# Patient Record
Sex: Male | Born: 1935 | Race: White | Hispanic: No | Smoking: Former smoker
Health system: Southern US, Community
[De-identification: ages and names within clinical notes are randomized; demographics above are authoritative.]

## PROBLEM LIST (undated history)

## (undated) DIAGNOSIS — I2510 Atherosclerotic heart disease of native coronary artery without angina pectoris: Secondary | ICD-10-CM

## (undated) DIAGNOSIS — I350 Nonrheumatic aortic (valve) stenosis: Secondary | ICD-10-CM

## (undated) DIAGNOSIS — K4090 Unilateral inguinal hernia, without obstruction or gangrene, not specified as recurrent: Secondary | ICD-10-CM

## (undated) DIAGNOSIS — N183 Chronic kidney disease, stage 3 unspecified: Secondary | ICD-10-CM

## (undated) DIAGNOSIS — R918 Other nonspecific abnormal finding of lung field: Secondary | ICD-10-CM

## (undated) DIAGNOSIS — I714 Abdominal aortic aneurysm, without rupture, unspecified: Secondary | ICD-10-CM

## (undated) DIAGNOSIS — Z952 Presence of prosthetic heart valve: Secondary | ICD-10-CM

## (undated) DIAGNOSIS — I10 Essential (primary) hypertension: Secondary | ICD-10-CM

## (undated) DIAGNOSIS — R109 Unspecified abdominal pain: Secondary | ICD-10-CM

## (undated) DIAGNOSIS — G8929 Other chronic pain: Secondary | ICD-10-CM

## (undated) DIAGNOSIS — F22 Delusional disorders: Secondary | ICD-10-CM

## (undated) DIAGNOSIS — Z8639 Personal history of other endocrine, nutritional and metabolic disease: Secondary | ICD-10-CM

## (undated) HISTORY — DX: Personal history of other endocrine, nutritional and metabolic disease: Z86.39

## (undated) HISTORY — PX: CATARACT EXTRACTION, BILATERAL: SHX1313

## (undated) HISTORY — DX: Essential (primary) hypertension: I10

---
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
## 2001-08-12 ENCOUNTER — Ambulatory Visit (HOSPITAL_COMMUNITY): Admission: RE | Admit: 2001-08-12 | Discharge: 2001-08-12 | Payer: Self-pay | Admitting: Ophthalmology

## 2001-09-16 ENCOUNTER — Ambulatory Visit (HOSPITAL_COMMUNITY): Admission: RE | Admit: 2001-09-16 | Discharge: 2001-09-16 | Payer: Self-pay | Admitting: Ophthalmology

## 2002-09-17 HISTORY — PX: EYE SURGERY: SHX253

## 2002-09-21 ENCOUNTER — Other Ambulatory Visit: Admission: RE | Admit: 2002-09-21 | Discharge: 2002-09-21 | Payer: Self-pay | Admitting: Dermatology

## 2003-10-12 ENCOUNTER — Ambulatory Visit (HOSPITAL_COMMUNITY): Admission: RE | Admit: 2003-10-12 | Discharge: 2003-10-12 | Payer: Self-pay | Admitting: Ophthalmology

## 2010-05-25 ENCOUNTER — Emergency Department (HOSPITAL_COMMUNITY): Admission: EM | Admit: 2010-05-25 | Discharge: 2010-05-25 | Payer: Self-pay | Admitting: Emergency Medicine

## 2010-06-16 ENCOUNTER — Ambulatory Visit (HOSPITAL_COMMUNITY): Payer: Self-pay | Admitting: Psychology

## 2010-06-27 ENCOUNTER — Ambulatory Visit (HOSPITAL_COMMUNITY): Payer: Self-pay | Admitting: Psychology

## 2010-09-08 ENCOUNTER — Emergency Department (HOSPITAL_COMMUNITY)
Admission: EM | Admit: 2010-09-08 | Discharge: 2010-09-08 | Payer: Self-pay | Source: Home / Self Care | Admitting: Emergency Medicine

## 2010-10-10 ENCOUNTER — Ambulatory Visit (HOSPITAL_COMMUNITY)
Admission: RE | Admit: 2010-10-10 | Discharge: 2010-10-10 | Payer: Self-pay | Source: Home / Self Care | Attending: Psychiatry | Admitting: Psychiatry
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
## 2010-10-14 ENCOUNTER — Inpatient Hospital Stay (HOSPITAL_COMMUNITY)
Admission: EM | Admit: 2010-10-14 | Discharge: 2010-10-18 | DRG: 603 | Disposition: A | Discharge status: Home Health | Payer: MEDICARE | Attending: Internal Medicine | Admitting: Internal Medicine

## 2010-10-14 DIAGNOSIS — F29 Unspecified psychosis not due to a substance or known physiological condition: Secondary | ICD-10-CM | POA: Diagnosis present

## 2010-10-14 DIAGNOSIS — I10 Essential (primary) hypertension: Secondary | ICD-10-CM | POA: Diagnosis present

## 2010-10-14 DIAGNOSIS — R946 Abnormal results of thyroid function studies: Secondary | ICD-10-CM | POA: Diagnosis present

## 2010-10-14 DIAGNOSIS — D649 Anemia, unspecified: Secondary | ICD-10-CM | POA: Diagnosis present

## 2010-10-14 DIAGNOSIS — E538 Deficiency of other specified B group vitamins: Secondary | ICD-10-CM | POA: Diagnosis present

## 2010-10-14 DIAGNOSIS — L02419 Cutaneous abscess of limb, unspecified: Principal | ICD-10-CM | POA: Diagnosis present

## 2010-10-14 DIAGNOSIS — A4901 Methicillin susceptible Staphylococcus aureus infection, unspecified site: Secondary | ICD-10-CM | POA: Diagnosis present

## 2010-10-14 DIAGNOSIS — R7309 Other abnormal glucose: Secondary | ICD-10-CM | POA: Diagnosis present

## 2010-10-14 LAB — DIFFERENTIAL
Basophils Absolute: 0 10*3/uL (ref 0.0–0.1)
Basophils Relative: 1 % (ref 0–1)
Lymphocytes Relative: 22 % (ref 12–46)
Monocytes Absolute: 0.8 10*3/uL (ref 0.1–1.0)
Neutro Abs: 5.3 10*3/uL (ref 1.7–7.7)
Neutrophils Relative %: 65 % (ref 43–77)

## 2010-10-14 LAB — COMPREHENSIVE METABOLIC PANEL
ALT: 33 U/L (ref 0–53)
AST: 37 U/L (ref 0–37)
Albumin: 3.2 g/dL — ABNORMAL LOW (ref 3.5–5.2)
Alkaline Phosphatase: 66 U/L (ref 39–117)
CO2: 28 mEq/L (ref 19–32)
Calcium: 8.4 mg/dL (ref 8.4–10.5)
Chloride: 98 mEq/L (ref 96–112)
Glucose, Bld: 87 mg/dL (ref 70–99)
Potassium: 4.2 mEq/L (ref 3.5–5.1)
Sodium: 134 mEq/L — ABNORMAL LOW (ref 135–145)
Total Bilirubin: 0.2 mg/dL — ABNORMAL LOW (ref 0.3–1.2)
Total Protein: 6.8 g/dL (ref 6.0–8.3)

## 2010-10-14 LAB — RAPID URINE DRUG SCREEN, HOSP PERFORMED
Barbiturates: NOT DETECTED
Benzodiazepines: NOT DETECTED
Cocaine: NOT DETECTED
Tetrahydrocannabinol: NOT DETECTED

## 2010-10-14 LAB — CBC
HCT: 34.2 % — ABNORMAL LOW (ref 39.0–52.0)
MCV: 87.5 fL (ref 78.0–100.0)
Platelets: 249 10*3/uL (ref 150–400)
RBC: 3.91 MIL/uL — ABNORMAL LOW (ref 4.22–5.81)
WBC: 8.1 10*3/uL (ref 4.0–10.5)

## 2010-10-14 LAB — URINE MICROSCOPIC-ADD ON

## 2010-10-14 LAB — URINALYSIS, ROUTINE W REFLEX MICROSCOPIC
Ketones, ur: NEGATIVE mg/dL
Leukocytes, UA: NEGATIVE
Nitrite: NEGATIVE
Protein, ur: NEGATIVE mg/dL

## 2010-10-15 LAB — DIFFERENTIAL
Basophils Relative: 0 % (ref 0–1)
Eosinophils Absolute: 0.5 10*3/uL (ref 0.0–0.7)
Eosinophils Relative: 7 % — ABNORMAL HIGH (ref 0–5)
Monocytes Absolute: 0.8 10*3/uL (ref 0.1–1.0)
Monocytes Relative: 12 % (ref 3–12)
Neutro Abs: 3.9 10*3/uL (ref 1.7–7.7)
Neutrophils Relative %: 57 % (ref 43–77)

## 2010-10-15 LAB — CBC
HCT: 30.2 % — ABNORMAL LOW (ref 39.0–52.0)
MCH: 28.4 pg (ref 26.0–34.0)
MCHC: 32.5 g/dL (ref 30.0–36.0)
RBC: 3.45 MIL/uL — ABNORMAL LOW (ref 4.22–5.81)
RDW: 13.2 % (ref 11.5–15.5)

## 2010-10-15 LAB — VITAMIN B12: Vitamin B-12: 268 pg/mL (ref 211–911)

## 2010-10-15 LAB — FOLATE: Folate: 18.6 ng/mL

## 2010-10-15 LAB — GLUCOSE, CAPILLARY: Glucose-Capillary: 132 mg/dL — ABNORMAL HIGH (ref 70–99)

## 2010-10-15 LAB — TSH: TSH: 0.02 u[IU]/mL — ABNORMAL LOW (ref 0.350–4.500)

## 2010-10-16 LAB — GLUCOSE, CAPILLARY
Glucose-Capillary: 114 mg/dL — ABNORMAL HIGH (ref 70–99)
Glucose-Capillary: 96 mg/dL (ref 70–99)
Glucose-Capillary: 128 mg/dL — ABNORMAL HIGH (ref 70–99)

## 2010-10-16 LAB — COMPREHENSIVE METABOLIC PANEL
ALT: 20 U/L (ref 0–53)
AST: 28 U/L (ref 0–37)
Alkaline Phosphatase: 60 U/L (ref 39–117)
BUN: 11 mg/dL (ref 6–23)
Chloride: 104 mEq/L (ref 96–112)
GFR calc Af Amer: >60 mL/min (ref >60)
Potassium: 4 mEq/L (ref 3.5–5.1)
Sodium: 136 mEq/L (ref 135–145)
Total Bilirubin: 0.4 mg/dL (ref 0.3–1.2)

## 2010-10-17 LAB — CBC
MCHC: 33 g/dL (ref 30.0–36.0)
Platelets: 277 10*3/uL (ref 150–400)
RDW: 13.1 % (ref 11.5–15.5)
WBC: 7.2 10*3/uL (ref 4.0–10.5)

## 2010-10-17 LAB — DIFFERENTIAL
Basophils Absolute: 0 10*3/uL (ref 0.0–0.1)
Basophils Relative: 1 % (ref 0–1)
Eosinophils Absolute: 0.4 10*3/uL (ref 0.0–0.7)
Eosinophils Relative: 6 % — ABNORMAL HIGH (ref 0–5)
Lymphocytes Relative: 24 % (ref 12–46)
Monocytes Absolute: 0.8 10*3/uL (ref 0.1–1.0)

## 2010-10-17 LAB — GLUCOSE, CAPILLARY
Glucose-Capillary: 91 mg/dL (ref 70–99)
Glucose-Capillary: 101 mg/dL — ABNORMAL HIGH (ref 70–99)

## 2010-10-18 ENCOUNTER — Encounter (HOSPITAL_COMMUNITY): Payer: Self-pay

## 2010-10-18 ENCOUNTER — Inpatient Hospital Stay (HOSPITAL_COMMUNITY)
Admission: EM | Admit: 2010-10-18 | Discharge: 2010-10-18 | Disposition: A | Discharge status: Home / Self Care | Payer: MEDICARE | Source: Home / Self Care

## 2010-10-18 LAB — COMPREHENSIVE METABOLIC PANEL
AST: 43 U/L — ABNORMAL HIGH (ref 0–37)
Albumin: 2.7 g/dL — ABNORMAL LOW (ref 3.5–5.2)
Alkaline Phosphatase: 67 U/L (ref 39–117)
BUN: 13 mg/dL (ref 6–23)
Chloride: 101 mEq/L (ref 96–112)
GFR calc Af Amer: >60 mL/min (ref >60)
Potassium: 4.1 mEq/L (ref 3.5–5.1)
Sodium: 137 mEq/L (ref 135–145)
Total Protein: 6.2 g/dL (ref 6.0–8.3)

## 2010-10-18 LAB — CBC
HCT: 34.8 % — ABNORMAL LOW (ref 39.0–52.0)
Hemoglobin: 11.6 g/dL — ABNORMAL LOW (ref 13.0–17.0)
MCH: 29 pg (ref 26.0–34.0)
MCHC: 33.3 g/dL (ref 30.0–36.0)
RDW: 13.2 % (ref 11.5–15.5)

## 2010-10-18 LAB — DIFFERENTIAL
Basophils Relative: 1 % (ref 0–1)
Eosinophils Relative: 9 % — ABNORMAL HIGH (ref 0–5)
Monocytes Absolute: 1 10*3/uL (ref 0.1–1.0)
Monocytes Relative: 13 % — ABNORMAL HIGH (ref 3–12)
Neutro Abs: 3.7 10*3/uL (ref 1.7–7.7)

## 2010-10-18 LAB — GLUCOSE, CAPILLARY: Glucose-Capillary: 91 mg/dL (ref 70–99)

## 2010-10-18 MED ORDER — SODIUM PERTECHNETATE TC 99M INJECTION
10.0000 | Freq: Once | INTRAVENOUS | Status: AC | PRN
  Administered 2010-10-18 (×2): 10.3 via INTRAVENOUS

## 2010-10-18 MED ORDER — SODIUM IODIDE I 131 CAPSULE
10.0000 | Freq: Once | INTRAVENOUS | Status: AC | PRN
  Administered 2010-10-17: 7 via ORAL

## 2010-10-18 MED ORDER — SODIUM IODIDE I 131 CAPSULE: 10.0000 | Freq: Once | INTRAVENOUS | Status: AC | PRN

## 2010-10-19 LAB — CULTURE, BLOOD (ROUTINE X 2): Culture: NO GROWTH

## 2010-10-21 LAB — METHYLMALONIC ACID, SERUM: Methylmalonic Acid, Quantitative: 146 nmol/L (ref 87–318)
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
## 2010-10-22 NOTE — Discharge Summary (Signed)
  Dan Perez, Dan Perez NO.:  [id number]  MEDICAL RECORD NO.:  [id number]           PATIENT TYPE:  I  LOCATION:  A302                          FACILITY:  [facility]  PHYSICIAN:  Marinda Elk, M.D.DATE OF BIRTH:  06/07/1932  DATE OF ADMISSION:  10/14/2010 DATE OF DISCHARGE:  02/01/2012LH                              DISCHARGE SUMMARY   ADDENDUM  DISCHARGE DIAGNOSIS:  Borderline B12 with MMA pending.  MEDICATION LIST:  Vitamin B12, 1000 mcg daily for 4 days and weekly for a month, then monthly.  HOSPITAL COURSE: 1. Vitamin B12.  B12 was checked which was borderline low.  MMA is     pending at the time of this dictation.  We have been replacing his     B12 with daily injections of B12.  He will need to follow up with     his primary care doctor, Dr. Juanetta Gosling.     Marinda Elk, M.D.     AF/MEDQ  D:  10/18/2010  T:  10/19/2010  Job:  [id number]  Electronically Signed by Lambert Keto M.D. on 10/22/2010 11:47:00 AM
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
## 2010-10-23 ENCOUNTER — Ambulatory Visit (HOSPITAL_COMMUNITY): Payer: MEDICARE

## 2010-10-24 ENCOUNTER — Other Ambulatory Visit (HOSPITAL_COMMUNITY): Payer: MEDICARE

## 2010-11-14 ENCOUNTER — Other Ambulatory Visit (HOSPITAL_COMMUNITY): Payer: Self-pay | Admitting: "Endocrinology

## 2010-11-14 DIAGNOSIS — E049 Nontoxic goiter, unspecified: Secondary | ICD-10-CM

## 2010-11-15 ENCOUNTER — Ambulatory Visit (HOSPITAL_COMMUNITY)
Admission: RE | Admit: 2010-11-15 | Discharge: 2010-11-15 | Disposition: A | Discharge status: Home / Self Care | Payer: Medicare Other | Source: Ambulatory Visit | Attending: Internal Medicine | Admitting: Internal Medicine

## 2010-11-15 DIAGNOSIS — E049 Nontoxic goiter, unspecified: Secondary | ICD-10-CM

## 2010-11-15 DIAGNOSIS — E042 Nontoxic multinodular goiter: Secondary | ICD-10-CM | POA: Insufficient documentation

## 2010-11-16 ENCOUNTER — Other Ambulatory Visit (HOSPITAL_COMMUNITY): Payer: Self-pay | Admitting: "Endocrinology

## 2010-11-16 DIAGNOSIS — E049 Nontoxic goiter, unspecified: Secondary | ICD-10-CM

## 2010-11-21 ENCOUNTER — Ambulatory Visit (HOSPITAL_COMMUNITY): Payer: Medicare Other

## 2010-11-21 ENCOUNTER — Encounter (HOSPITAL_COMMUNITY): Payer: Medicare Other | Admitting: Psychiatry

## 2010-11-27 LAB — DIFFERENTIAL
Basophils Absolute: 0 10*3/uL (ref 0.0–0.1)
Lymphocytes Relative: 31 % (ref 12–46)
Lymphs Abs: 1.9 10*3/uL (ref 0.7–4.0)
Monocytes Absolute: 0.7 10*3/uL (ref 0.1–1.0)
Monocytes Relative: 12 % (ref 3–12)
Neutro Abs: 3.1 10*3/uL (ref 1.7–7.7)

## 2010-11-27 LAB — URINALYSIS, ROUTINE W REFLEX MICROSCOPIC
Ketones, ur: NEGATIVE mg/dL
Leukocytes, UA: NEGATIVE
Nitrite: NEGATIVE
Protein, ur: NEGATIVE mg/dL
Urobilinogen, UA: 0.2 mg/dL (ref 0.0–1.0)
pH: 6 (ref 5.0–8.0)

## 2010-11-27 LAB — RAPID URINE DRUG SCREEN, HOSP PERFORMED
Amphetamines: NOT DETECTED
Tetrahydrocannabinol: NOT DETECTED

## 2010-11-27 LAB — BASIC METABOLIC PANEL
GFR calc non Af Amer: >60 mL/min (ref >60)
Potassium: 4.2 mEq/L (ref 3.5–5.1)
Sodium: 137 mEq/L (ref 135–145)

## 2010-11-27 LAB — CBC
HCT: 38.2 % — ABNORMAL LOW (ref 39.0–52.0)
Hemoglobin: 12.4 g/dL — ABNORMAL LOW (ref 13.0–17.0)
RBC: 4.39 MIL/uL (ref 4.22–5.81)
WBC: 6 10*3/uL (ref 4.0–10.5)

## 2010-11-27 LAB — ETHANOL: Alcohol, Ethyl (B): <5 mg/dL (ref 0–10)

## 2010-11-28 ENCOUNTER — Other Ambulatory Visit (HOSPITAL_COMMUNITY): Payer: Self-pay | Admitting: "Endocrinology

## 2010-11-28 ENCOUNTER — Encounter (HOSPITAL_COMMUNITY): Payer: Self-pay

## 2010-11-28 ENCOUNTER — Ambulatory Visit (HOSPITAL_COMMUNITY)
Admission: RE | Admit: 2010-11-28 | Discharge: 2010-11-28 | Disposition: A | Discharge status: Home / Self Care | Payer: Medicare Other | Source: Ambulatory Visit | Attending: Internal Medicine | Admitting: Internal Medicine

## 2010-11-28 DIAGNOSIS — E049 Nontoxic goiter, unspecified: Secondary | ICD-10-CM

## 2010-11-28 DIAGNOSIS — R918 Other nonspecific abnormal finding of lung field: Secondary | ICD-10-CM | POA: Insufficient documentation

## 2010-11-28 DIAGNOSIS — R937 Abnormal findings on diagnostic imaging of other parts of musculoskeletal system: Secondary | ICD-10-CM | POA: Insufficient documentation

## 2010-11-28 MED ORDER — IOHEXOL 300 MG/ML  SOLN
100.0000 mL | Freq: Once | INTRAMUSCULAR | Status: AC | PRN
  Administered 2010-11-28: 100 mL via INTRAVENOUS

## 2010-11-30 LAB — DIFFERENTIAL
Basophils Absolute: 0 10*3/uL (ref 0.0–0.1)
Basophils Relative: 0 % (ref 0–1)
Lymphocytes Relative: 28 % (ref 12–46)
Monocytes Absolute: 0.6 10*3/uL (ref 0.1–1.0)
Monocytes Relative: 10 % (ref 3–12)
Neutro Abs: 3.7 10*3/uL (ref 1.7–7.7)
Neutrophils Relative %: 57 % (ref 43–77)

## 2010-11-30 LAB — RAPID URINE DRUG SCREEN, HOSP PERFORMED
Barbiturates: NOT DETECTED
Benzodiazepines: NOT DETECTED
Cocaine: NOT DETECTED
Opiates: NOT DETECTED

## 2010-11-30 LAB — HEPATIC FUNCTION PANEL
ALT: 41 U/L (ref 0–53)
AST: 41 U/L — ABNORMAL HIGH (ref 0–37)
Albumin: 3.7 g/dL (ref 3.5–5.2)
Bilirubin, Direct: 0.1 mg/dL (ref 0.0–0.3)

## 2010-11-30 LAB — URINALYSIS, ROUTINE W REFLEX MICROSCOPIC
Bilirubin Urine: NEGATIVE
Glucose, UA: NEGATIVE mg/dL
Specific Gravity, Urine: 1.025 (ref 1.005–1.030)
Urobilinogen, UA: 0.2 mg/dL (ref 0.0–1.0)

## 2010-11-30 LAB — URINE MICROSCOPIC-ADD ON

## 2010-11-30 LAB — CBC
HCT: 36.3 % — ABNORMAL LOW (ref 39.0–52.0)
Hemoglobin: 12.2 g/dL — ABNORMAL LOW (ref 13.0–17.0)
RDW: 13.7 % (ref 11.5–15.5)
WBC: 6.4 10*3/uL (ref 4.0–10.5)

## 2010-11-30 LAB — ETHANOL: Alcohol, Ethyl (B): <5 mg/dL (ref 0–10)

## 2010-11-30 LAB — BASIC METABOLIC PANEL
Calcium: 8.7 mg/dL (ref 8.4–10.5)
GFR calc non Af Amer: >60 mL/min (ref >60)
Potassium: 4.3 mEq/L (ref 3.5–5.1)
Sodium: 136 mEq/L (ref 135–145)

## 2010-12-20 ENCOUNTER — Encounter (INDEPENDENT_AMBULATORY_CARE_PROVIDER_SITE_OTHER): Payer: Medicare Other | Admitting: Thoracic Surgery

## 2010-12-20 DIAGNOSIS — E049 Nontoxic goiter, unspecified: Secondary | ICD-10-CM

## 2010-12-21 NOTE — Letter (Signed)
December 20, 2010  Purcell Nails, MD [address] [place], [place] [address]  Re:  Dan Perez, Dan Perez             DOB:  1932-07-09  Dear Dr. Fransico Him:  I appreciate the opportunity to see the patient.  This 77 year old patient had a right thyroidectomy, partial thyroidectomy in 1962 for apparently a goiter and was found to have a left substernal goiter that was 12.5 cm x 6 cm x 8.2 cm.  His radioactive iodine uptake was decreased at 6% and had low TSH all suggestive of possible thyroiditis. Because of this, there is also the evidence of tracheal compression, he is referred here for evaluation.  PAST MEDICAL HISTORY:  He had a fall 3-4 months ago into a rabbit cage after going to sleep from [place] inhalation.  He probably had fractures of his fourth through sixth rib on the left side, somehow left rib fractures also.  He has hypertension and diabetes mellitus type 2.  MEDICATIONS:  He takes: 1. Lasix 20 mg a day. 2. Lisinopril 5 mg a day. 3. Metformin 5000 mg a day. 4. Hydrochlorothiazide 12.5 mg daily. Marland Kitchen  He has no allergies.  FAMILY HISTORY:  Positive for thyroid disease.  His father had thyroidectomy and goiters and his daughter also has been followed for thyroid cyst.  SOCIAL HISTORY:  Single, has had 7 children, apparently divorced.  He quit smoking in 1984.  Does not drink alcohol on a regular basis.  REVIEW OF SYSTEMS:  180 pounds, 5 feet 4 inches.  His weight is fluctuated. CARDIAC:  He has a heart murmur and some intermittent arrhythmias. PULMONARY:  No hemoptysis.  No bronchitis. GI:  No nausea, vomiting, constipation, diarrhea. GU:  No kidney disease, dysuria, or frequent urination. VASCULAR:  No claudication, DVT, TIAs. NEUROLOGICAL:  No dizziness headaches, blackouts, seizures. MUSCULOSKELETAL:  He has had intermittent skeletal rash and some muscle pain. PSYCHIATRIC:  No depression or nervousness. EYE AND ENT:  No change in eyesight or  hearing. HEMATOLOGICAL:  No problems with bleeding, clotting disorders, or anemia.  On physical exam, he is a thin Caucasian male in no acute distress.  His blood pressure is 153/76, pulse 84, respirations 18, sats were 95%. Head, eyes, ears, nose, and throat are unremarkable.  Neck appears a thyroid scar.  There is no carotid bruits, cannot really palpate left lobe of his thyroid, may be something at the base of his neck.  Chest: He has got scoliosis and dorsal kyphosis.  His lungs are clear to auscultation and percussion.  Heart:  There is a 2/6 systolic ejection murmur.  Abdomen is soft.  Bowel sounds are normal.  There is no palpable mass.  There is a question of small abdominal aneurysm seen on the CT scan, but could not palpate that.  Pulses are 2+.  There is no clubbing or edema.  Neurologically, he is oriented x3.  Sensory and motor intact.  Cranial nerves intact.  I feel that the size of this goiter needs to be resected and that hopefully we can do this from the neck.  I will refer him to Dr. Darnell Level for his evaluation and Dr. Gerrit Friends and I, due to these cases together.  He may need this is one that may need a partial sternotomy of full sternotomy to do the resection.  I explained this to him and his family and we will schedule this after he seeing Dr. Gerrit Friends.  Ines Bloomer, M.D. Electronically Signed  DPB/MEDQ  D:  12/20/2010  T:  12/21/2010  Job:  [id number]  cc:   Ramon Dredge L. Juanetta Gosling, M.D.

## 2011-02-28 ENCOUNTER — Encounter (HOSPITAL_COMMUNITY)
Admission: RE | Admit: 2011-02-28 | Discharge: 2011-02-28 | Disposition: A | Discharge status: Home / Self Care | Payer: Medicare Other | Source: Ambulatory Visit | Attending: Surgery | Admitting: Surgery

## 2011-02-28 LAB — ABO/RH: ABO/RH(D): A POS

## 2011-02-28 LAB — CBC
MCH: 28.3 pg (ref 26.0–34.0)
MCHC: 33 g/dL (ref 30.0–36.0)
MCV: 85.8 fL (ref 78.0–100.0)
Platelets: 194 10*3/uL (ref 150–400)
RBC: 4.24 MIL/uL (ref 4.22–5.81)
RDW: 13.1 % (ref 11.5–15.5)

## 2011-02-28 LAB — TYPE AND SCREEN
ABO/RH(D): A POS
Antibody Screen: NEGATIVE

## 2011-02-28 LAB — URINALYSIS, ROUTINE W REFLEX MICROSCOPIC
Bilirubin Urine: NEGATIVE
Ketones, ur: NEGATIVE mg/dL
Leukocytes, UA: NEGATIVE
Nitrite: NEGATIVE
Protein, ur: NEGATIVE mg/dL
pH: 6 (ref 5.0–8.0)

## 2011-02-28 LAB — DIFFERENTIAL
Basophils Relative: 1 % (ref 0–1)
Eosinophils Absolute: 0.3 10*3/uL (ref 0.0–0.7)
Eosinophils Relative: 4 % (ref 0–5)
Lymphs Abs: 2.2 10*3/uL (ref 0.7–4.0)
Monocytes Absolute: 0.7 10*3/uL (ref 0.1–1.0)
Monocytes Relative: 12 % (ref 3–12)
Neutrophils Relative %: 48 % (ref 43–77)

## 2011-02-28 LAB — BASIC METABOLIC PANEL
BUN: 13 mg/dL (ref 6–23)
Creatinine, Ser: 0.92 mg/dL (ref 0.4–1.5)
GFR calc Af Amer: >60 mL/min (ref >60)
GFR calc non Af Amer: >60 mL/min (ref >60)
Potassium: 5.3 mEq/L — ABNORMAL HIGH (ref 3.5–5.1)

## 2011-02-28 LAB — PROTIME-INR: Prothrombin Time: 12.9 seconds (ref 11.6–15.2)

## 2011-02-28 LAB — URINE MICROSCOPIC-ADD ON

## 2011-03-01 LAB — HEPATIC FUNCTION PANEL
Alkaline Phosphatase: 71 U/L (ref 39–117)
Bilirubin, Direct: <0.1 mg/dL (ref 0.0–0.3)
Total Protein: 6.6 g/dL (ref 6.0–8.3)

## 2011-03-02 ENCOUNTER — Ambulatory Visit (HOSPITAL_COMMUNITY)
Admission: RE | Admit: 2011-03-02 | Discharge: 2011-03-04 | Disposition: A | Discharge status: Home / Self Care | Payer: Medicare Other | Source: Ambulatory Visit | Attending: Surgery | Admitting: Surgery

## 2011-03-02 ENCOUNTER — Other Ambulatory Visit (INDEPENDENT_AMBULATORY_CARE_PROVIDER_SITE_OTHER): Payer: Self-pay | Admitting: Surgery

## 2011-03-02 ENCOUNTER — Ambulatory Visit (HOSPITAL_COMMUNITY)
Admission: RE | Admit: 2011-03-02 | Discharge: 2011-03-02 | Disposition: A | Discharge status: Home / Self Care | Payer: Medicare Other | Source: Ambulatory Visit | Attending: Surgery | Admitting: Surgery

## 2011-03-02 DIAGNOSIS — Z01818 Encounter for other preprocedural examination: Secondary | ICD-10-CM | POA: Insufficient documentation

## 2011-03-02 DIAGNOSIS — E049 Nontoxic goiter, unspecified: Secondary | ICD-10-CM

## 2011-03-02 DIAGNOSIS — Z23 Encounter for immunization: Secondary | ICD-10-CM | POA: Insufficient documentation

## 2011-03-02 DIAGNOSIS — J398 Other specified diseases of upper respiratory tract: Secondary | ICD-10-CM | POA: Insufficient documentation

## 2011-03-02 DIAGNOSIS — Z01810 Encounter for preprocedural cardiovascular examination: Secondary | ICD-10-CM | POA: Insufficient documentation

## 2011-03-02 DIAGNOSIS — E041 Nontoxic single thyroid nodule: Secondary | ICD-10-CM

## 2011-03-02 HISTORY — PX: THYROIDECTOMY: SHX17

## 2011-03-02 LAB — APTT: aPTT: 28 s (ref 24–37)

## 2011-03-02 LAB — BLOOD GAS, ARTERIAL
FIO2: 21 %
O2 Saturation: 98.6 %
Patient temperature: 98.6
TCO2: 25.3 mmol/L (ref 0–100)
pH, Arterial: 7.441 (ref 7.350–7.450)

## 2011-03-04 LAB — CALCIUM: Calcium: 8.9 mg/dL (ref 8.4–10.5)

## 2011-03-05 NOTE — Op Note (Signed)
Perez, PLAIN NO.:  [id number]  MEDICAL RECORD NO.:  [id number]  LOCATION:                               FACILITY:  [facility]  PHYSICIAN:  Velora Heckler, MD      DATE OF BIRTH:  17-Oct-1931  DATE OF PROCEDURE:  03/02/2011                               OPERATIVE REPORT   PREOPERATIVE DIAGNOSIS:  Substernal thyroid goiter with tracheal deviation.  POSTOPERATIVE DIAGNOSIS:  Substernal thyroid goiter with tracheal deviation.  PROCEDURE:  Left thyroid lobectomy and isthmusectomy with cervical approach to resection of substernal thyroid goiter.  SURGEON:  Velora Heckler, MD, FACS  ASSISTANT:  Ines Bloomer, MD  ANESTHESIA:  General per Dr. Sheldon Silvan.  ESTIMATED BLOOD LOSS:  150 mL.  PREPARATION:  ChloraPrep.  COMPLICATIONS:  None.  INDICATIONS:  The patient is a 77 year old white male from [place], [place].  He has a previous history of thyroid surgery at [facility] in [place].  During a recent medical evaluation, the patient had a chest x-ray showing tracheal deviation.  Subsequent CT scan of the chest and thyroid ultrasound showed an enlarged thyroid mainly with an enlarged left lobe extending into the anterior mediastinum measuring approximately 12 cm in greatest dimension.  It was heterogeneous with course calcifications and multiple small nodules.  A small right thyroid remnant was present measuring 3.9 x 1.6 x 1.6 cm. The patient was seen by Dr. Karle Plumber and referred to my practice. He now comes to the operating room for resection of left thyroid lobe and substernal goiter.  BODY OF REPORT:  Procedure was done in OR #7 at the [facility].  The patient was brought to the operating room and placed in supine position on the operating room table.  Following administration of general anesthesia, the patient was positioned and then prepped and draped in the usual strict aseptic fashion.   After ascertaining that an adequate level of anesthesia had been achieved, the patient's previous Kocher incision was reopened with a #15 blade. Dissection was carried through subcutaneous tissues and platysma. Hemostasis was obtained with electrocautery.  External jugular veins were divided between hemostats and ligated with 2-0 silk ties.  Skin flaps were elevated cephalad and caudad and Mahorner retractor was placed for exposure.  Strap muscles were incised in the midline and dissection was carried onto the thyroid.  There was a markedly enlarged thyroid isthmus.  There was scar tissue present from the prior surgery making dissection planes difficult.  With gentle and blunt dissection, judicious use of the harmonic scalpel, and minimal use of the electrocautery, the gland was gently mobilized.  Venous tributaries were divided between Ligaclips with the harmonic scalpel.  Superior pole vessels were identified, divided between medium Ligaclips with the harmonic scalpel.  Gland was gently mobilized in the left neck.  There was marked tracheal deviation to the right.  With gentle and blunt dissection, the large goiter was delivered out of the mediastinum.  This required instrumentation with retractors in order to elevate the large thyroid which was beyond the reach of the finger from the neck.  The gland was eventually delivered up and into the  cervical wound.  Branches of the inferior thyroid artery were divided between Ligaclips with the harmonic scalpel.  Recurrent nerve was identified and preserved. Parathyroid tissue was not identified on the left side of the neck. Gland was dissected out up to the level of the ligament of Berry.  The ligament of Allyson Sabal was transected and the gland was mobilized across the midline.  There was no significant pyramidal lobe, although the isthmus was markedly enlarged.  Dissection was carried to the right of midline where there was a narrowing of the  isthmus down to what appeared to be simply scar tissue at the level of the trachea.  No residual right thyroid lobe was identified on gross visualization.  On palpation, there were no nodules and no masses.  Therefore, the gland was excised off the anterior trachea with electrocautery.  A suture was used to mark the inferior pole of the left lobe where there was the most dense calcifications and cystic changes.  The entire specimen was submitted to Pathology for review.  Neck was irrigated with warm saline.  Good hemostasis was achieved. Surgicel was placed in the operative field.  A 7-mm flat drain was also placed in the left neck and brought out through the left lateral portion of the cervical incision and placed to bulb suction.  Strap muscles were reapproximated in the midline with interrupted 3-0 Vicryl sutures. Platysma was closed with interrupted 3-0 Vicryl sutures.  Skin incision was closed with running 4-0 Monocryl subcuticular suture.  Wound was washed and dried.  Benzoin and Steri-Strips were applied.  Sterile dressings were applied.  The patient was awakened from anesthesia and brought to the recovery room.  The patient tolerated the procedure well.   Velora Heckler, MD, FACS     TMG/MEDQ  D:  03/02/2011  T:  03/03/2011  Job:  [id number]  cc:   Ines Bloomer, M.D. Edward L. Juanetta Gosling, M.D.  Electronically Signed by Darnell Level MD on 03/05/2011 09:26:10 AM

## 2011-03-26 ENCOUNTER — Encounter (INDEPENDENT_AMBULATORY_CARE_PROVIDER_SITE_OTHER): Payer: Self-pay | Admitting: Surgery

## 2011-03-27 ENCOUNTER — Encounter (INDEPENDENT_AMBULATORY_CARE_PROVIDER_SITE_OTHER): Payer: Self-pay | Admitting: Surgery

## 2011-03-27 ENCOUNTER — Ambulatory Visit (INDEPENDENT_AMBULATORY_CARE_PROVIDER_SITE_OTHER): Payer: Medicare Other | Admitting: Surgery

## 2011-03-27 VITALS — Temp 97.6°F

## 2011-03-27 DIAGNOSIS — E049 Nontoxic goiter, unspecified: Secondary | ICD-10-CM

## 2011-03-27 NOTE — Progress Notes (Signed)
HISTORY: Patient is a 77 year old white male who underwent left thyroid lobectomy for a substernal goiter. Final pathologic results are all benign. He is taking thyroid hormone replacement. He is being followed by his endocrinologist.   PERTINENT REVIEW OF SYSTEMS: Patient denies any significant pain. He is had no dysphagia. His voice quality has been excellent.   EXAM: Surgical wound has healed nicely with good cosmetic result. No significant soft tissue swelling. No sign of seroma. First quality is normal.   IMPRESSION: Status post left thyroid lobectomy for substernal thyroid goiter, final pathologic results benign.   PLAN: Patient will follow up with his endocrinologist in [place], Dr. Fransico Him. He will return to see me in 6 weeks for wound check. He will begin applying topical cream to his incision.

## 2011-05-09 ENCOUNTER — Encounter (INDEPENDENT_AMBULATORY_CARE_PROVIDER_SITE_OTHER): Payer: Self-pay | Admitting: Surgery

## 2011-05-10 ENCOUNTER — Encounter (INDEPENDENT_AMBULATORY_CARE_PROVIDER_SITE_OTHER): Payer: Self-pay | Admitting: Surgery

## 2011-05-10 ENCOUNTER — Ambulatory Visit (INDEPENDENT_AMBULATORY_CARE_PROVIDER_SITE_OTHER): Payer: Medicare Other | Admitting: Surgery

## 2011-05-10 VITALS — BP 152/80

## 2011-05-10 DIAGNOSIS — E049 Nontoxic goiter, unspecified: Secondary | ICD-10-CM

## 2011-05-10 NOTE — Progress Notes (Signed)
Visit Diagnoses: 1. Substernal thyroid goiter     HISTORY: Patient returns for followup having undergone resection of substernal thyroid goiter in June 2012.   EXAM: Patient has normal voice quality. There is no stridor. Surgical wound is well healed. No sign of infection. No palpable masses.   IMPRESSION: Status post resection of substernal goiter. Benign final pathology.   PLAN: Patient will continue followup with his endocrinologist for adjustment of his thyroid hormone dosage. Patient will return to see me as needed.   Velora Heckler, MD, FACS General & Endocrine Surgery [facility]

## 2011-08-28 ENCOUNTER — Other Ambulatory Visit: Payer: Self-pay

## 2011-08-28 ENCOUNTER — Emergency Department (HOSPITAL_COMMUNITY)
Admission: EM | Admit: 2011-08-28 | Discharge: 2011-08-29 | Disposition: A | Discharge status: Home / Self Care | Payer: Medicare Other | Attending: Emergency Medicine | Admitting: Emergency Medicine

## 2011-08-28 ENCOUNTER — Encounter (HOSPITAL_COMMUNITY): Payer: Self-pay | Admitting: *Deleted

## 2011-08-28 ENCOUNTER — Emergency Department (HOSPITAL_COMMUNITY): Payer: Medicare Other

## 2011-08-28 DIAGNOSIS — M7989 Other specified soft tissue disorders: Secondary | ICD-10-CM | POA: Insufficient documentation

## 2011-08-28 DIAGNOSIS — F22 Delusional disorders: Secondary | ICD-10-CM | POA: Insufficient documentation

## 2011-08-28 DIAGNOSIS — L02419 Cutaneous abscess of limb, unspecified: Secondary | ICD-10-CM | POA: Insufficient documentation

## 2011-08-28 DIAGNOSIS — I10 Essential (primary) hypertension: Secondary | ICD-10-CM | POA: Insufficient documentation

## 2011-08-28 DIAGNOSIS — R600 Localized edema: Secondary | ICD-10-CM

## 2011-08-28 DIAGNOSIS — L0390 Cellulitis, unspecified: Secondary | ICD-10-CM

## 2011-08-28 DIAGNOSIS — R609 Edema, unspecified: Secondary | ICD-10-CM | POA: Insufficient documentation

## 2011-08-28 LAB — URINALYSIS, ROUTINE W REFLEX MICROSCOPIC
Bilirubin Urine: NEGATIVE
Glucose, UA: NEGATIVE mg/dL
Hgb urine dipstick: NEGATIVE
Ketones, ur: NEGATIVE mg/dL
Leukocytes, UA: NEGATIVE
Nitrite: NEGATIVE
Protein, ur: NEGATIVE mg/dL
Specific Gravity, Urine: <1.005 — ABNORMAL LOW (ref 1.005–1.030)
Urobilinogen, UA: 0.2 mg/dL (ref 0.0–1.0)
pH: 6 (ref 5.0–8.0)

## 2011-08-28 LAB — CBC
HCT: 33.8 % — ABNORMAL LOW (ref 39.0–52.0)
Hemoglobin: 11.2 g/dL — ABNORMAL LOW (ref 13.0–17.0)
MCHC: 33.1 g/dL (ref 30.0–36.0)
RBC: 3.78 MIL/uL — ABNORMAL LOW (ref 4.22–5.81)

## 2011-08-28 LAB — COMPREHENSIVE METABOLIC PANEL
ALT: 29 U/L (ref 0–53)
AST: 35 U/L (ref 0–37)
Albumin: 3.3 g/dL — ABNORMAL LOW (ref 3.5–5.2)
Alkaline Phosphatase: 71 U/L (ref 39–117)
BUN: 16 mg/dL (ref 6–23)
CO2: 28 mEq/L (ref 19–32)
Calcium: 9.3 mg/dL (ref 8.4–10.5)
Chloride: 98 mEq/L (ref 96–112)
Creatinine, Ser: 1.29 mg/dL (ref 0.50–1.35)
GFR calc Af Amer: 60 mL/min — ABNORMAL LOW (ref >90)
GFR calc non Af Amer: 52 mL/min — ABNORMAL LOW (ref >90)
Glucose, Bld: 91 mg/dL (ref 70–99)
Potassium: 4.2 mEq/L (ref 3.5–5.1)
Sodium: 135 mEq/L (ref 135–145)
Total Bilirubin: 0.4 mg/dL (ref 0.3–1.2)
Total Protein: 7.2 g/dL (ref 6.0–8.3)

## 2011-08-28 LAB — DIFFERENTIAL
Lymphocytes Relative: 23 % (ref 12–46)
Lymphs Abs: 1.9 10*3/uL (ref 0.7–4.0)
Monocytes Absolute: 1 10*3/uL (ref 0.1–1.0)
Monocytes Relative: 12 % (ref 3–12)
Neutro Abs: 5.4 10*3/uL (ref 1.7–7.7)
Neutrophils Relative %: 64 % (ref 43–77)

## 2011-08-28 LAB — TROPONIN I: Troponin I: <0.3 ng/mL (ref <0.30)

## 2011-08-28 MED ORDER — VANCOMYCIN HCL IN DEXTROSE 1-5 GM/200ML-% IV SOLN
1000.0000 mg | Freq: Once | INTRAVENOUS | Status: AC
  Administered 2011-08-29: 1000 mg via INTRAVENOUS
  Filled 2011-08-28: qty 200

## 2011-08-28 MED ORDER — FUROSEMIDE 10 MG/ML IJ SOLN
20.0000 mg | Freq: Once | INTRAMUSCULAR | Status: AC
  Administered 2011-08-29: 20 mg via INTRAVENOUS
  Filled 2011-08-28: qty 2

## 2011-08-28 NOTE — ED Notes (Signed)
Into room to assess patient. States he has had bilateral lower leg swelling since February. Unable to verbalize when symptoms worsen. +3 nonpitting edema and bilateral redness of lower legs and knees. Skin very taught. Denies any pain. Unable to palpate petal pulses due to swelling. Denies any needs. Family at bedside. Call bell within reach. No shortness of breath or chest pain.

## 2011-08-28 NOTE — ED Notes (Signed)
Patient report given to this nurse. Assuming care of patient. Patient resting sitting up in bed. No distress. Equal chest rise and fall. Denies needs.

## 2011-08-28 NOTE — ED Notes (Signed)
Pt denies any specific concerns at present time.  Family reporting cellulitis and concerns regarding worsening dementia.
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
## 2011-08-29 ENCOUNTER — Emergency Department (HOSPITAL_COMMUNITY): Payer: Medicare Other

## 2011-08-29 LAB — TSH: TSH: 0.582 u[IU]/mL (ref 0.350–4.500)

## 2011-08-29 LAB — T4, FREE: Free T4: 0.98 ng/dL (ref 0.80–1.80)

## 2011-08-29 LAB — T3, FREE: T3, Free: 2.1 pg/mL — ABNORMAL LOW (ref 2.3–4.2)

## 2011-08-29 MED ORDER — DIPHENHYDRAMINE HCL 50 MG/ML IJ SOLN
12.5000 mg | Freq: Once | INTRAMUSCULAR | Status: AC
  Administered 2011-08-29: 12.5 mg via INTRAVENOUS
  Filled 2011-08-29: qty 1

## 2011-08-29 MED ORDER — DOXYCYCLINE HYCLATE 100 MG PO CAPS: 100.0000 mg | ORAL_CAPSULE | Freq: Two times a day (BID) | ORAL | Status: AC

## 2011-08-29 NOTE — ED Notes (Signed)
Dr. Bebe Shaggy requesting to speak to pt's family.  Pt called his daughter and she is on her way here.  Pt alert and oriented.  Pt sitting up in bed eating breakfast.

## 2011-08-29 NOTE — ED Provider Notes (Signed)
5:17 PM I met with the patient and with the patient's daughter who's here at present and discussed with him the results of Dr. Lanell Matar psychiatric assessment in which he deemed the patient not to represent a threat to himself or others and to have a chronic delusional state not requiring any further acute psychiatric interventions.  The patient and his daughter were in understanding of and agreement with the plan of care and relieved to hear the results of the consult.  The patient will be discharged home.  Felisa Bonier, MD 08/29/11 [phone]

## 2011-08-29 NOTE — ED Provider Notes (Addendum)
BP 117/52  Pulse 88  Temp(Src) 98.9 F (37.2 C) (Oral)  Resp 20  Ht 6' (1.829 m)  Wt 170 lb (77.111 kg)  BMI 23.06 kg/m2  SpO2 95% D/w dr Juanetta Gosling Pt has paranoia at baseline Will see in followup if necessary  Joya Gaskins, MD 08/29/11 1102  1:44 PM Daughter arrived Pt with increased confusion/paranoia She is requesting further psych eval Also has small bruise to head will get ct head telepsych pending  Joya Gaskins, MD 08/29/11 1344
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
## 2011-08-29 NOTE — ED Notes (Signed)
Patient to radiology via stretcher
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
## 2011-08-29 NOTE — ED Notes (Signed)
Into room to see patient. Denies any needs. No distress. No allergic reaction from antibiotic. Denies pain. Call bell at bedside. Family at bedside.

## 2011-08-29 NOTE — ED Notes (Signed)
Patient remains resting with eyes closed and lights off. No distress. Equal chest rise and fall at 20x\minute, regular, equal. Call bell within reach.

## 2011-08-29 NOTE — ED Notes (Signed)
Resting with eyes closed and lights off. Equal chest rise and fall. No distress. No facial grimaces. Call bell within reach.

## 2011-08-29 NOTE — ED Notes (Signed)
Patient back to room from radiology. Patient's left elbow dressed with one telfa and kling wrap to protect old oozing skin tear (dime sized in diameter). Denies any needs. Call bell within reach.

## 2011-08-29 NOTE — ED Notes (Signed)
MD at bedside. 

## 2011-08-29 NOTE — ED Notes (Signed)
Resting with eyes closed and lights off. Equal chest rise and fall. No distress. Equal chest rise and fall. Call bell and family at bedside.

## 2011-08-29 NOTE — ED Notes (Addendum)
Clothes inventoried with Hilbert Corrigan, Charity fundraiser. Items include: One pair of pants, One shirt, Two jackets, One pair brown shoes, One pair socks. One checkbook in pants pocket. One red shirt that remains on patient. One hat. Items locked in EMS cabinet.

## 2011-08-29 NOTE — ED Notes (Signed)
Patient has developed welps on upper right thigh near groin. States it itches a little. No drainage. Denies shortness of breath. md made aware.

## 2011-08-29 NOTE — ED Notes (Signed)
Remains resting with eyes closed and lights off. equal chest rise and fall. No distress. No facial grimaces. Call bell within reach. Bed in low position and locked with side rails up. Will continue to monitor.

## 2011-08-29 NOTE — ED Notes (Signed)
Patient resting with eyes closed and lights off. No distress. Equal chest rise and fall. No facial grimacing. Call bell within reach.

## 2011-08-29 NOTE — ED Notes (Signed)
Remains resting with eyes closed and lights off. No distress. Equal chest rise and fall. No facial grimaces. Call bell within reach. Bed in low position and locked with side rails up.

## 2011-08-29 NOTE — ED Notes (Signed)
Pt eating lunch.  Dan Perez with act team here to see pt.

## 2011-08-29 NOTE — ED Notes (Signed)
Remains resting with eyes closed and lights off. No distress. Equal chest rise and fall. Call bell within reach. No facial grimaces.

## 2011-08-29 NOTE — ED Notes (Signed)
Remains resting in bed with eyes closed and lights off. Equal chest rise and fall. No distress. Call bell within reach. Will continue to monitor.

## 2011-08-29 NOTE — ED Notes (Signed)
Attempts to contact patients daughter unsuccessful.

## 2011-08-29 NOTE — ED Notes (Signed)
Rash on leg diminishing. States it does not itch. Denies any needs. Denies pain. Denies shortness of breath. Call bell within reach. Bed in low position and locked with side rails up.

## 2011-08-29 NOTE — ED Notes (Signed)
Given two warm blankets per request. Allergies and identity verified with patient. Medicated as ordered per md. Denies any needs. Denies pain. Family at bedside. Call bell within reach. No distress.

## 2011-08-29 NOTE — ED Notes (Signed)
Daughter leaving to go home. Name [place]. Number [phone].

## 2013-02-18 ENCOUNTER — Ambulatory Visit (HOSPITAL_COMMUNITY)
Admission: RE | Admit: 2013-02-18 | Discharge: 2013-02-18 | Disposition: A | Discharge status: Home / Self Care | Payer: Medicare Other | Source: Ambulatory Visit | Attending: Pulmonary Disease | Admitting: Pulmonary Disease

## 2013-02-18 ENCOUNTER — Other Ambulatory Visit (HOSPITAL_COMMUNITY): Payer: Self-pay | Admitting: Pulmonary Disease

## 2013-02-18 DIAGNOSIS — R1031 Right lower quadrant pain: Secondary | ICD-10-CM | POA: Insufficient documentation

## 2013-02-18 DIAGNOSIS — K469 Unspecified abdominal hernia without obstruction or gangrene: Secondary | ICD-10-CM

## 2013-02-18 DIAGNOSIS — R933 Abnormal findings on diagnostic imaging of other parts of digestive tract: Secondary | ICD-10-CM | POA: Insufficient documentation

## 2013-07-14 ENCOUNTER — Emergency Department (HOSPITAL_COMMUNITY)
Admission: EM | Admit: 2013-07-14 | Discharge: 2013-07-16 | Disposition: A | Discharge status: Other Acute Inpatient Hospital | Payer: Medicare Other | Attending: Emergency Medicine | Admitting: Emergency Medicine

## 2013-07-14 ENCOUNTER — Encounter (HOSPITAL_COMMUNITY): Payer: Self-pay | Admitting: Emergency Medicine

## 2013-07-14 DIAGNOSIS — F22 Delusional disorders: Secondary | ICD-10-CM | POA: Insufficient documentation

## 2013-07-14 DIAGNOSIS — Z79899 Other long term (current) drug therapy: Secondary | ICD-10-CM | POA: Insufficient documentation

## 2013-07-14 DIAGNOSIS — E119 Type 2 diabetes mellitus without complications: Secondary | ICD-10-CM | POA: Insufficient documentation

## 2013-07-14 DIAGNOSIS — E049 Nontoxic goiter, unspecified: Secondary | ICD-10-CM | POA: Insufficient documentation

## 2013-07-14 DIAGNOSIS — I10 Essential (primary) hypertension: Secondary | ICD-10-CM | POA: Insufficient documentation

## 2013-07-14 HISTORY — DX: Delusional disorders: F22

## 2013-07-14 LAB — RAPID URINE DRUG SCREEN, HOSP PERFORMED
Amphetamines: NOT DETECTED
Barbiturates: NOT DETECTED
Tetrahydrocannabinol: NOT DETECTED

## 2013-07-14 LAB — BASIC METABOLIC PANEL
BUN: 21 mg/dL (ref 6–23)
Chloride: 98 mEq/L (ref 96–112)
GFR calc Af Amer: 56 mL/min — ABNORMAL LOW (ref >90)
GFR calc non Af Amer: 49 mL/min — ABNORMAL LOW (ref >90)
Potassium: 4.2 mEq/L (ref 3.5–5.1)
Sodium: 136 mEq/L (ref 135–145)

## 2013-07-14 LAB — CBC WITH DIFFERENTIAL/PLATELET
Basophils Absolute: 0.1 10*3/uL (ref 0.0–0.1)
Basophils Relative: 1 % (ref 0–1)
Hemoglobin: 11.9 g/dL — ABNORMAL LOW (ref 13.0–17.0)
MCHC: 32.8 g/dL (ref 30.0–36.0)
Monocytes Relative: 10 % (ref 3–12)
Neutro Abs: 2.8 10*3/uL (ref 1.7–7.7)
Neutrophils Relative %: 45 % (ref 43–77)
Platelets: 181 10*3/uL (ref 150–400)

## 2013-07-14 LAB — URINALYSIS W MICROSCOPIC + REFLEX CULTURE
Leukocytes, UA: NEGATIVE
Nitrite: NEGATIVE
Protein, ur: NEGATIVE mg/dL
Specific Gravity, Urine: 1.025 (ref 1.005–1.030)
Urobilinogen, UA: 0.2 mg/dL (ref 0.0–1.0)

## 2013-07-14 LAB — ETHANOL: Alcohol, Ethyl (B): <11 mg/dL (ref 0–11)

## 2013-07-14 NOTE — Progress Notes (Signed)
Dan Perez MHT, Writer received call from [employer] that  Pt has been denied due to medical acuity.

## 2013-07-14 NOTE — ED Notes (Signed)
Ronda Fairly RN from [facility] called and reports pt was referred to a geriatric psych unit and they will notify [facility] staff of any updates.

## 2013-07-14 NOTE — ED Provider Notes (Signed)
CSN: [id number]     Arrival date & time 07/14/13  0220 History   First MD Initiated Contact with Patient 07/14/13 0235     Chief Complaint  Patient presents with  . V70.1    HPI Pt was seen at 0255. Per Police, IVC paperwork and pt, c/o gradual onset and persistence of constant paranoid delusions for an unknown period of time. Pt states he went to the Police department to "talk about some issues I'm having" and was subsequently IVC'd my Therapist, occupational. Pt told Police that he "has a laser in his home and can hear their conversations and it sounds like two thousand hammers," and he "only gets up to reload." Pt stated he was "going to try to escape the laser when he gets home and named person Perrin Smack)." Stated to them he was "very concerned with terrorism and Muslims showing up" and "they want to use my house as a safe house." Pt denies SI, no SA, no HI.    Past Medical History  Diagnosis Date  . Thyroid disease     enlarged goiter  . Edema of both legs   . Hypertension   . Paranoia   . Delusional disorder   . Diabetes mellitus without complication    Past Surgical History  Procedure Laterality Date  . Thyroidectomy  03/02/2011    left lobectomy and isthmusectomy   Family History  Problem Relation Age of Onset  . Heart attack Father   . Dementia Mother   . Stroke Mother    History  Substance Use Topics  . Smoking status: Never Smoker   . Smokeless tobacco: Not on file  . Alcohol Use: No    Review of Systems ROS: Statement: All systems negative except as marked or noted in the HPI; Constitutional: Negative for fever and chills. ; ; Eyes: Negative for eye pain, redness and discharge. ; ; ENMT: Negative for ear pain, hoarseness, nasal congestion, sinus pressure and sore throat. ; ; Cardiovascular: Negative for chest pain, palpitations, diaphoresis, dyspnea and peripheral edema. ; ; Respiratory: Negative for cough, wheezing and stridor. ; ; Gastrointestinal: Negative for nausea,  vomiting, diarrhea, abdominal pain, blood in stool, hematemesis, jaundice and rectal bleeding. . ; ; Genitourinary: Negative for dysuria, flank pain and hematuria. ; ; Musculoskeletal: Negative for back pain and neck pain. Negative for swelling and trauma.; ; Skin: Negative for pruritus, rash, abrasions, blisters, bruising and skin lesion.; ; Neuro: Negative for headache, lightheadedness and neck stiffness. Negative for weakness, altered level of consciousness , altered mental status, extremity weakness, paresthesias, involuntary movement, seizure and syncope.; Psych:  No SI, no SA, no HI, +paranoid delusions.     Allergies  Review of patient's allergies indicates no known allergies.  Home Medications   Current Outpatient Rx  Name  Route  Sig  Dispense  Refill  . furosemide (LASIX) 20 MG tablet   Oral   Take 20 mg by mouth daily.          Marland Kitchen levothyroxine (LEVOTHROID) 25 MCG tablet   Oral   Take 25 mcg by mouth daily.           Marland Kitchen lisinopril (PRINIVIL,ZESTRIL) 5 MG tablet   Oral   Take 5 mg by mouth daily.           Marland Kitchen loratadine (CLARITIN) 10 MG tablet   Oral   Take 10 mg by mouth daily.           . metFORMIN (GLUMETZA) 500 MG (  MOD) 24 hr tablet   Oral   Take 500 mg by mouth daily with breakfast. Check dose           BP 175/76  Pulse 94  Temp(Src) 97.7 F (36.5 C) (Oral)  Resp 16  Ht 6' (1.829 m)  Wt 167 lb 5 oz (75.892 kg)  BMI 22.69 kg/m2  SpO2 100% Physical Exam 0300: Physical examination:  Nursing notes reviewed; Vital signs and O2 SAT reviewed;  Constitutional: Well developed, Well nourished, Well hydrated, In no acute distress; Head:  Normocephalic, atraumatic; Eyes: EOMI, PERRL, No scleral icterus; ENMT: Mouth and pharynx normal, Mucous membranes moist; Neck: Supple, Full range of motion, No lymphadenopathy; Cardiovascular: Regular rate and rhythm, No gallop; Respiratory: Breath sounds clear & equal bilaterally, No wheezes.  Speaking full sentences with ease,  Normal respiratory effort/excursion; Chest: Nontender, Movement normal; Abdomen: Soft, Nontender, Nondistended, Normal bowel sounds;; Extremities: Pulses normal, No deformity. No calf edema or asymmetry.; Neuro: AA&Ox3, Major CN grossly intact.  Speech clear. No gross focal motor or sensory deficits in extremities.; Skin: Color normal, Warm, Dry.; Psych:  Paranoid, delusional. Denies SI.     ED Course  Procedures    EKG Interpretation   None       MDM  MDM Reviewed: previous chart, nursing note and vitals Reviewed previous: labs Interpretation: labs   Results for orders placed during the hospital encounter of 07/14/13  CBC WITH DIFFERENTIAL      Result Value Range   WBC 6.1  4.0 - 10.5 K/uL   RBC 4.01 (*) 4.22 - 5.81 MIL/uL   Hemoglobin 11.9 (*) 13.0 - 17.0 g/dL   HCT 40.9 (*) 81.1 - 91.4 %   MCV 90.5  78.0 - 100.0 fL   MCH 29.7  26.0 - 34.0 pg   MCHC 32.8  30.0 - 36.0 g/dL   RDW 78.2  95.6 - 21.3 %   Platelets 181  150 - 400 K/uL   Neutrophils Relative % 45  43 - 77 %   Neutro Abs 2.8  1.7 - 7.7 K/uL   Lymphocytes Relative 39  12 - 46 %   Lymphs Abs 2.4  0.7 - 4.0 K/uL   Monocytes Relative 10  3 - 12 %   Monocytes Absolute 0.6  0.1 - 1.0 K/uL   Eosinophils Relative 5  0 - 5 %   Eosinophils Absolute 0.3  0.0 - 0.7 K/uL   Basophils Relative 1  0 - 1 %   Basophils Absolute 0.1  0.0 - 0.1 K/uL  BASIC METABOLIC PANEL      Result Value Range   Sodium 136  135 - 145 mEq/L   Potassium 4.2  3.5 - 5.1 mEq/L   Chloride 98  96 - 112 mEq/L   CO2 29  19 - 32 mEq/L   Glucose, Bld 95  70 - 99 mg/dL   BUN 21  6 - 23 mg/dL   Creatinine, Ser 0.86  0.50 - 1.35 mg/dL   Calcium 9.2  8.4 - 57.8 mg/dL   GFR calc non Af Amer 49 (*) >90 mL/min   GFR calc Af Amer 56 (*) >90 mL/min  ETHANOL      Result Value Range   Alcohol, Ethyl (B) <11  0 - 11 mg/dL  URINALYSIS W MICROSCOPIC + REFLEX CULTURE      Result Value Range   Color, Urine YELLOW  YELLOW   APPearance CLEAR  CLEAR    Specific Gravity, Urine 1.025  1.005 - 1.030   pH 6.0  5.0 - 8.0   Glucose, UA NEGATIVE  NEGATIVE mg/dL   Hgb urine dipstick SMALL (*) NEGATIVE   Bilirubin Urine NEGATIVE  NEGATIVE   Ketones, ur TRACE (*) NEGATIVE mg/dL   Protein, ur NEGATIVE  NEGATIVE mg/dL   Urobilinogen, UA 0.2  0.0 - 1.0 mg/dL   Nitrite NEGATIVE  NEGATIVE   Leukocytes, UA NEGATIVE  NEGATIVE   WBC, UA 0-2  <3 WBC/hpf   RBC / HPF 3-6  <3 RBC/hpf   Bacteria, UA FEW (*) RARE   Squamous Epithelial / LPF RARE  RARE  URINE RAPID DRUG SCREEN (HOSP PERFORMED)      Result Value Range   Opiates NONE DETECTED  NONE DETECTED   Cocaine NONE DETECTED  NONE DETECTED   Benzodiazepines NONE DETECTED  NONE DETECTED   Amphetamines NONE DETECTED  NONE DETECTED   Tetrahydrocannabinol NONE DETECTED  NONE DETECTED   Barbiturates NONE DETECTED  NONE DETECTED   Results for DEVAUN, HERNANDEZ (MRN [id number]) as of 07/14/2013 04:39  Ref. Range 10/17/2010 04:52 10/18/2010 04:52 02/28/2011 14:46 08/28/2011 21:53 07/14/2013 03:11  Hemoglobin Latest Range: 13.0-17.0 g/dL 95.6 (L) 21.3 (L) 08.6 (L) 11.2 (L) 11.9 (L)  HCT Latest Range: 39.0-52.0 % 33.3 (L) 34.8 (L) 36.4 (L) 33.8 (L) 36.3 (L)    0400:  No family in ED. H/H per baseline. VSS. Pending TTS eval.   0710:  Pending TTS eval. Sign out to Dr. Deretha Emory.      Laray Anger, DO 07/14/13 [phone]

## 2013-07-14 NOTE — Progress Notes (Signed)
T.Cornel Werber, MHT completed placement procedures by calling the following locations [facility] and [facility] [place] who have availability. The referral packet was faxed to the facilities for review. [facility] and [facility] may have availablilty tomorrow. [facility], [facility] and [facility] ,who were also called, do not have any bed availability.

## 2013-07-14 NOTE — ED Notes (Signed)
Pt's son at bedside, asking for copy of medical records, states he has POA over said pt. No paper work brought to verify this.  Family member was instructed to go to medical records per policy. Pt also asking to take pt's medical cards, food stamp card and credit cards. Family member was directed to charge RN. Pt remains quiet and cooperative at this time.

## 2013-07-14 NOTE — ED Notes (Signed)
Pt's son, Eathan Groman took a key from pt's belongings.

## 2013-07-14 NOTE — BH Assessment (Addendum)
TTS contacted both [facility] ED to speak with nurse and physician and [facility] ED to speak with Bruce in Disposition. Patient's nurse Payton Doughty, RN unavailable thus spoke with Vernell Barrier, RN at 3:47 PM to inform her of decision which had been made to refer patient to a geriatric psych unit. Before calling Bruce in Disposition at [facility] ED [facility] ED called back to inform TTS that patient is a veteran and patient's son who lives near [place] has requested referral to [facility] area if possible. Call placed to MHT Bruce in Disposition to request referral for patient at 3:58 PM. Dr Gwendolyn Grant informed of referral process and son's request at 4:07 PM Carney Bern, LCSW

## 2013-07-14 NOTE — ED Notes (Signed)
Was called out to registration department to talk to two daughters of pt's regarding guardianship.   Also informed Sheriff would be here to serve pt with said POA and guardianship papers. Daughter states she" would be taking my father home today and he would not be going to a facility". EDP notified and had conference with Son and sheriff.

## 2013-07-14 NOTE — ED Notes (Signed)
Pt's son in lobby asking to come back to say goodbye to pt. Visitor explained the visitation policy during subsequent visits . Pt asked to speak to my supervisor.  Charge Nurse notified.

## 2013-07-14 NOTE — BH Assessment (Signed)
Assessment Note  Patient is a 79 year old white male that was IVC'd by the [facility] Department due to paranoid delusions and psychosis for an unknown period of time.  Patient reports that he "has a laser in his home and can hear their conversations and it sounds like two thousand hammers".  Patient reports that he   was "going to try to escape the laser when he gets home".   Patient reports that he is "very concerned with terrorism and Muslims showing up" and "they want to use my house as a safe house."   Pt denies SI/HI.  Patient denies prior psychiatric hospitalizations.  Patient denies prior outpatient mental health therapy or outpatient medication management. Patient denies substance abuse.    Axis I: Bipolar, mixed,Mood Disorder rule out Dementia Axis II: Deferred Axis III:  Past Medical History  Diagnosis Date  . Thyroid disease     enlarged goiter  . Edema of both legs   . Hypertension   . Paranoia   . Delusional disorder   . Diabetes mellitus without complication    Axis IV: other psychosocial or environmental problems, problems related to social environment, problems with access to health care services and problems with primary support group Axis V: 21-30 behavior considerably influenced by delusions or hallucinations OR serious impairment in judgment, communication OR inability to function in almost all areas  Past Medical History:  Past Medical History  Diagnosis Date  . Thyroid disease     enlarged goiter  . Edema of both legs   . Hypertension   . Paranoia   . Delusional disorder   . Diabetes mellitus without complication     Past Surgical History  Procedure Laterality Date  . Thyroidectomy  03/02/2011    left lobectomy and isthmusectomy    Family History:  Family History  Problem Relation Age of Onset  . Heart attack Father   . Dementia Mother   . Stroke Mother     Social History:  reports that he has never smoked. He does not have any  smokeless tobacco history on file. He reports that he does not drink alcohol or use illicit drugs.  Additional Social History:     CIWA: CIWA-Ar BP: 152/70 mmHg Pulse Rate: 91 COWS:    Allergies: No Known Allergies  Home Medications:  (Not in a hospital admission)  OB/GYN Status:  No LMP for male patient.  General Assessment Data Location of Assessment: [facility] Services Is this a Tele or Face-to-Face Assessment?: Tele Assessment Is this an Initial Assessment or a Re-assessment for this encounter?: Initial Assessment Living Arrangements: Alone Can pt return to current living arrangement?: Yes Admission Status: Involuntary Is patient capable of signing voluntary admission?: No Transfer from: Acute Hospital Referral Source: Self/Family/Friend  Medical Screening Exam [facility] Walk-in ONLY) Medical Exam completed: Yes  [facility] Crisis Care Plan Living Arrangements: Alone     Risk to self Suicidal Ideation: No Suicidal Intent: No Is patient at risk for suicide?: No Suicidal Plan?: No Access to Means: No What has been your use of drugs/alcohol within the last 12 months?: n/a Previous Attempts/Gestures: No How many times?: 0 Other Self Harm Risks: None Triggers for Past Attempts: Unpredictable Intentional Self Injurious Behavior: None Family Suicide History: No Recent stressful life event(s): Other (Comment) Persecutory voices/beliefs?: Yes Depression: No Substance abuse history and/or treatment for substance abuse?: No Suicide prevention information given to non-admitted patients: Not applicable  Risk to Others Homicidal Ideation: No Thoughts of Harm to  Others: No Current Homicidal Intent: No Current Homicidal Plan: No Access to Homicidal Means: No Identified Victim: None History of harm to others?: No Assessment of Violence: None Noted Violent Behavior Description: calm Does patient have access to weapons?: Yes (Comment) Criminal Charges Pending?: No Does  patient have a court date: No  Psychosis Hallucinations: Auditory;Visual Delusions: None noted  Mental Status Report Appear/Hygiene: Other (Comment) Eye Contact: Fair Motor Activity: Unremarkable Speech: Tangential Level of Consciousness: Quiet/awake Mood: Suspicious Affect: Blunted Anxiety Level: None Thought Processes: Irrelevant;Flight of Ideas Judgement: Unimpaired Orientation: Person;Place;Time;Situation Obsessive Compulsive Thoughts/Behaviors: None  Cognitive Functioning Concentration: Decreased Memory: Recent Impaired;Remote Intact IQ: Average Insight: Poor Impulse Control: Poor Appetite: Fair Weight Loss: 0 Weight Gain: 0 Sleep: Decreased Total Hours of Sleep: 5 Vegetative Symptoms: None  ADLScreening [facility] Assessment Services) Patient's cognitive ability adequate to safely complete daily activities?: Yes Patient able to express need for assistance with ADLs?: Yes Independently performs ADLs?: Yes (appropriate for developmental age)  Prior Inpatient Therapy Prior Inpatient Therapy: No Prior Therapy Dates: na Prior Therapy Facilty/Provider(s): na Reason for Treatment: na  Prior Outpatient Therapy Prior Outpatient Therapy: No Prior Therapy Dates: na Prior Therapy Facilty/Provider(s): na Reason for Treatment: na  ADL Screening (condition at time of admission) Patient's cognitive ability adequate to safely complete daily activities?: Yes Patient able to express need for assistance with ADLs?: Yes Independently performs ADLs?: Yes (appropriate for developmental age)         Values / Beliefs Cultural Requests During Hospitalization: None Spiritual Requests During Hospitalization: None        Additional Information 1:1 In Past 12 Months?: No CIRT Risk: No Elopement Risk: No Does patient have medical clearance?: Yes     Disposition: Pending Psych consult to rule out Dementia.  Disposition Initial Assessment Completed for this Encounter:  Yes Disposition of Patient: Referred to Patient referred to: Other (Comment)  On Site Evaluation by:   Reviewed with Physician:    Phillip Heal LaVerne 07/14/2013 7:42 AM

## 2013-07-14 NOTE — ED Notes (Signed)
Patient's son contact at [phone] per patient's request. This is the number to use if son needs to be contacted again.

## 2013-07-14 NOTE — ED Notes (Signed)
[facility] notified RN of 1220 tele-psych consult time frame to speak with NP.

## 2013-07-15 MED ORDER — RISPERIDONE 0.5 MG PO TABS
ORAL_TABLET | ORAL | Status: AC
  Filled 2013-07-15: qty 1

## 2013-07-15 MED ORDER — LEVOTHYROXINE SODIUM 25 MCG PO TABS
25.0000 ug | ORAL_TABLET | Freq: Every day | ORAL | Status: DC
  Administered 2013-07-15 – 2013-07-16 (×2): 25 ug via ORAL
  Filled 2013-07-15 (×5): qty 1

## 2013-07-15 MED ORDER — LISINOPRIL 5 MG PO TABS
5.0000 mg | ORAL_TABLET | Freq: Every day | ORAL | Status: DC
  Administered 2013-07-15 – 2013-07-16 (×2): 5 mg via ORAL
  Filled 2013-07-15 (×2): qty 1

## 2013-07-15 MED ORDER — LORATADINE 10 MG PO TABS
10.0000 mg | ORAL_TABLET | Freq: Every day | ORAL | Status: DC
  Administered 2013-07-15: 10 mg via ORAL
  Filled 2013-07-15: qty 1

## 2013-07-15 MED ORDER — RISPERIDONE 0.5 MG PO TABS
0.5000 mg | ORAL_TABLET | Freq: Every day | ORAL | Status: DC
  Administered 2013-07-15: 0.5 mg via ORAL
  Filled 2013-07-15 (×3): qty 1

## 2013-07-15 NOTE — ED Notes (Signed)
Pt resting in bed, voices no complaints, offered am bath, pt declined.

## 2013-07-15 NOTE — Progress Notes (Signed)
Contacted [address] [facility] [phone]). Per Kirt Boys, pt referral has been received, but has not been reviewed. Debbe Odea Yazmine Sorey,MHT

## 2013-07-15 NOTE — ED Notes (Signed)
Ac notified of need for meds. Will bring.

## 2013-07-15 NOTE — ED Notes (Signed)
Pt resting in bed, visiting w/ family.  Sitter at bedside.

## 2013-07-15 NOTE — ED Notes (Signed)
Pt's son Len, states he and his sister are trying to get guardianship of pt.after he is discharged. States Corlis Leak will come and talk with the pt and see if he is incompetent to make his own decision before granting them full guardianship. Sons # [phone]

## 2013-07-15 NOTE — Progress Notes (Signed)
[facility] Spoke to [facility] 0900 stated that pt is below age requirement for there facility.   Eleri Ruben Cathey MHT

## 2013-07-15 NOTE — ED Notes (Signed)
Pt ate entire meal tray, sitter at bedside.  

## 2013-07-15 NOTE — ED Notes (Signed)
Pt's son at bedside for a visit.  Tolerated meal tray well.

## 2013-07-15 NOTE — Progress Notes (Signed)
Writer followed up with [facility].Pt.  does not meet criteria.Writer faxed [facility] a referral.Writer is waiting on a decision. Writer will continue to keep looking for placement. Shanda Bumps Sander Remedios,MHT

## 2013-07-16 DIAGNOSIS — F22 Delusional disorders: Secondary | ICD-10-CM | POA: Insufficient documentation

## 2013-07-16 LAB — GLUCOSE, CAPILLARY

## 2013-07-16 NOTE — Progress Notes (Signed)
[facility]: Spoke with [facility] beds were available and referral was faxed.  Syanne Looney Cathey, MHT

## 2013-07-16 NOTE — ED Provider Notes (Signed)
Pt has been accepted by Dr. Bethann Punches (sp?) at [facility], MD 07/16/13 [phone]

## 2013-07-16 NOTE — ED Notes (Addendum)
Per [facility], [facility]. Awaiting synthroid from pharmacy

## 2013-07-16 NOTE — BH Assessment (Signed)
Received a call from [place] with [facility]. Sts that patient was referred to their facility. Patient was declined and [facility] was recommended.

## 2013-07-16 NOTE — ED Notes (Signed)
Pt sleeping. Chest rise and fall noted. Sitter at bedside. Snoring.

## 2013-07-16 NOTE — Progress Notes (Signed)
Informed RN Neysa Bonito at [facility] [phone]), that pt. And physician signature needed for [facility] referral. Faxed all three pages to [facility] [phone]). Rodman Pickle, MHT

## 2013-07-16 NOTE — ED Notes (Signed)
[facility] called and stated pt accepted to [facility] by Dr. Bethann Punches. EDP aware. Pt aware. Will call son

## 2013-07-16 NOTE — Progress Notes (Addendum)
[facility]: @ [address] with [place] she is running pt by Dr and will return the call. 1430 Pt accepted to [facility], MHT

## 2013-07-16 NOTE — BH Assessment (Signed)
EDP-Dr. Bebe Shaggy called requesting updates with this patient's disposition. Writer reviewed Dan Perez, Dan Perez notes in EPIC. It appears that patient was referred to several places, therefore; awaiting inpatient placement.   Her notes read the following:    Contacted the following facilities to seek placement for pt. :  [facility]- per Dan Perez, bed available, referral faxed  [facility]- per Dan Perez, bed available, referral faxed  [facility]- per Dan Perez, bed available, referral faxed  [facility]- per Dan Asp, bed available, referral faxed   [facility]- 2x no answer  [facility]- 2x no answer  [facility]- per   Dan Perez shared the above information with Dr. Bebe Shaggy.

## 2013-07-16 NOTE — ED Notes (Signed)
Asked what he meant by politics and pt said that he was talking about Islamic people coming over here to do away with Christianity and "they started asking me if i felt like hurting any body else or myself and I told them no". Pt aware awaiting placement for treatment. deneis si/hi. NAD at this time.

## 2013-07-16 NOTE — Progress Notes (Addendum)
[facility]: Spoke with Chris @ [phone] he confirmed receipt of referral, going to review in house referrals and return the call once he reviewed this pt.  Adain Geurin Cathey, MHT

## 2013-07-16 NOTE — ED Notes (Signed)
Officer here to get patient, belonging returned to office

## 2013-07-16 NOTE — Progress Notes (Signed)
Contacted the following facilities to seek placement for pt. :  [facility]- per Delice Bison, bed available, referral faxed [facility]- per Shanda Bumps, bed available, referral faxed [facility]- per Kyla Balzarine, bed available, referral faxed [facility]- per Arline Asp, bed available, referral faxed  [facility]- 2x no answer [facility]- 2x no answer [facility]- per [place], busy signal, direct number [phone]  Rodman Pickle, MHT

## 2013-07-25 DIAGNOSIS — I872 Venous insufficiency (chronic) (peripheral): Secondary | ICD-10-CM | POA: Insufficient documentation

## 2013-07-25 DIAGNOSIS — G309 Alzheimer's disease, unspecified: Secondary | ICD-10-CM | POA: Insufficient documentation

## 2013-10-07 ENCOUNTER — Emergency Department (HOSPITAL_COMMUNITY)
Admission: EM | Admit: 2013-10-07 | Discharge: 2013-10-08 | Disposition: A | Discharge status: Home / Self Care | Payer: Medicare Other | Attending: Emergency Medicine | Admitting: Emergency Medicine

## 2013-10-07 ENCOUNTER — Encounter (HOSPITAL_COMMUNITY): Payer: Self-pay | Admitting: Emergency Medicine

## 2013-10-07 DIAGNOSIS — E049 Nontoxic goiter, unspecified: Secondary | ICD-10-CM | POA: Insufficient documentation

## 2013-10-07 DIAGNOSIS — Z79899 Other long term (current) drug therapy: Secondary | ICD-10-CM | POA: Insufficient documentation

## 2013-10-07 DIAGNOSIS — E119 Type 2 diabetes mellitus without complications: Secondary | ICD-10-CM | POA: Insufficient documentation

## 2013-10-07 DIAGNOSIS — R0789 Other chest pain: Secondary | ICD-10-CM | POA: Insufficient documentation

## 2013-10-07 DIAGNOSIS — Z8659 Personal history of other mental and behavioral disorders: Secondary | ICD-10-CM | POA: Insufficient documentation

## 2013-10-07 DIAGNOSIS — K4090 Unilateral inguinal hernia, without obstruction or gangrene, not specified as recurrent: Secondary | ICD-10-CM | POA: Insufficient documentation

## 2013-10-07 DIAGNOSIS — I10 Essential (primary) hypertension: Secondary | ICD-10-CM | POA: Insufficient documentation

## 2013-10-07 NOTE — ED Notes (Signed)
Patient c/o inguinal hernia pain.

## 2013-10-08 NOTE — Discharge Instructions (Signed)

## 2013-10-08 NOTE — ED Provider Notes (Signed)
CSN: [id number]     Arrival date & time 10/07/13  2247 History   First MD Initiated Contact with Patient 10/08/13 0024     Chief Complaint  Patient presents with  . Hernia   HPI Patient reports inguinal hernia a mass over the past year.  The patient has not seen general surgery regarding this.  The size did not change today.  The discomfort did not change today.  He denies nausea vomiting diarrhea.  No fevers or chills.  His son recommended that he come to the ER to "have it checked out".  Daughter is concerned that he may have had some transient chest discomfort earlier today.  The patient is unable to describe it very well.  He does describe a squeezing sensation in his left upper quadrant of his abdomen.  No associated shortness of breath.  No diaphoresis.  No prior history of cardiac disease.  He denies chest pain this time.  He states this was a transient episode.  He states he is more concerned about his ongoing right inguinal hernia.  He denies significant pain at this time.   Past Medical History  Diagnosis Date  . Thyroid disease     enlarged goiter  . Edema of both legs   . Hypertension   . Paranoia   . Delusional disorder   . Diabetes mellitus without complication    Past Surgical History  Procedure Laterality Date  . Thyroidectomy  03/02/2011    left lobectomy and isthmusectomy   Family History  Problem Relation Age of Onset  . Heart attack Father   . Dementia Mother   . Stroke Mother    History  Substance Use Topics  . Smoking status: Never Smoker   . Smokeless tobacco: Not on file  . Alcohol Use: No    Review of Systems  All other systems reviewed and are negative.    Allergies  Review of patient's allergies indicates no known allergies.  Home Medications   Current Outpatient Rx  Name  Route  Sig  Dispense  Refill  . levothyroxine (LEVOTHROID) 25 MCG tablet   Oral   Take 25 mcg by mouth daily.           Marland Kitchen. lisinopril (PRINIVIL,ZESTRIL) 5 MG tablet   Oral   Take 5 mg by mouth daily.           Marland Kitchen. loratadine (CLARITIN) 10 MG tablet   Oral   Take 10 mg by mouth daily.           . risperiDONE (RISPERDAL) 0.5 MG tablet   Oral   Take 0.5 mg by mouth daily. At bedtime          BP 172/82  Pulse 83  Temp(Src) 97.7 F (36.5 C) (Oral)  Resp 16  Ht 5\' 10"  (1.778 m)  Wt 180 lb (81.647 kg)  BMI 25.83 kg/m2  SpO2 100% Physical Exam  Nursing note and vitals reviewed. Constitutional: He is oriented to person, place, and time. He appears well-developed and well-nourished.  HENT:  Head: Normocephalic and atraumatic.  Eyes: EOM are normal.  Neck: Normal range of motion.  Cardiovascular: Normal rate, regular rhythm, normal heart sounds and intact distal pulses.   Pulmonary/Chest: Effort normal and breath sounds normal. No respiratory distress.  Abdominal: Soft. He exhibits no distension. There is no tenderness.  Genitourinary:  Easily reducible right inguinal hernia through a large defect in the inguinal canal.  No scrotal skin changes.  No erythema.  No significant tenderness.  Musculoskeletal: Normal range of motion.  Neurological: He is alert and oriented to person, place, and time.  Skin: Skin is warm and dry.  Psychiatric: He has a normal mood and affect. Judgment normal.    ED Course  Procedures (including critical care time) Labs Review Labs Reviewed - No data to display Imaging Review No results found.  EKG Interpretation    Date/Time:  Thursday October 08 2013 00:43:35 EST Ventricular Rate:  62 PR Interval:  170 QRS Duration: 100 QT Interval:  432 QTC Calculation: 438 R Axis:   -24 Text Interpretation:  Normal sinus rhythm Possible Left atrial enlargement Incomplete right bundle branch block Borderline ECG When compared with ECG of 28-Aug-2011 23:04, Criteria for Anterior infarct are no longer Present ST no longer depressed in Inferior leads Confirmed by [place] Current  MD, Sabena Winner ([id number]) on 10/08/2013 1:00:38 AM             MDM   1. Right inguinal hernia    Referral to general surgery with standard hernia precautions.  Easily reducible this time.  Chest pain is atypical transient.  EKG is normal sinus rhythm without concerning ST changes.  Dr. pain at this time.  We'll defer to PCP.  Doubt ACS    Lyanne Co, MD 10/08/13 [phone]

## 2013-10-09 ENCOUNTER — Encounter (HOSPITAL_COMMUNITY): Payer: Self-pay | Admitting: Emergency Medicine

## 2013-10-09 ENCOUNTER — Emergency Department (HOSPITAL_COMMUNITY)
Admission: EM | Admit: 2013-10-09 | Discharge: 2013-10-09 | Discharge status: Against Medical Advice | Payer: Medicare Other | Attending: Emergency Medicine | Admitting: Emergency Medicine

## 2013-10-09 DIAGNOSIS — E119 Type 2 diabetes mellitus without complications: Secondary | ICD-10-CM | POA: Insufficient documentation

## 2013-10-09 DIAGNOSIS — Z79899 Other long term (current) drug therapy: Secondary | ICD-10-CM | POA: Insufficient documentation

## 2013-10-09 DIAGNOSIS — I10 Essential (primary) hypertension: Secondary | ICD-10-CM | POA: Insufficient documentation

## 2013-10-09 DIAGNOSIS — R109 Unspecified abdominal pain: Secondary | ICD-10-CM | POA: Insufficient documentation

## 2013-10-09 DIAGNOSIS — F22 Delusional disorders: Secondary | ICD-10-CM | POA: Insufficient documentation

## 2013-10-09 DIAGNOSIS — E079 Disorder of thyroid, unspecified: Secondary | ICD-10-CM | POA: Insufficient documentation

## 2013-10-09 NOTE — ED Provider Notes (Signed)
CSN: [id number]     Arrival date & time 10/09/13  1052 History   First MD Initiated Contact with Patient 10/09/13 1143     Chief Complaint  Patient presents with  . V70.1    HPI Pt was seen at 1205.  Per pt, c/o gradual onset and persistence of constant abd "pain" for the past 1 year.  States he has an known inguinal hernia and was walking to the General Surgeon's office to make an appointment to "get it fixed." Pt states he was evaluated in the ED yesterday for same, but came back today "because I forgot to ask if I can take tylenol for the pain."  Denies any change in his hernia size or discomfort. Denies N/V/D, no fevers, no back pain, no rash, no CP/SOB.    Past Medical History  Diagnosis Date  . Thyroid disease     enlarged goiter  . Edema of both legs   . Hypertension   . Paranoia   . Delusional disorder   . Diabetes mellitus without complication    Past Surgical History  Procedure Laterality Date  . Thyroidectomy  03/02/2011    left lobectomy and isthmusectomy   Family History  Problem Relation Age of Onset  . Heart attack Father   . Dementia Mother   . Stroke Mother    History  Substance Use Topics  . Smoking status: Never Smoker   . Smokeless tobacco: Not on file  . Alcohol Use: No    Review of Systems ROS: Statement: All systems negative except as marked or noted in the HPI; Constitutional: Negative for fever and chills. ; ; Eyes: Negative for eye pain, redness and discharge. ; ; ENMT: Negative for ear pain, hoarseness, nasal congestion, sinus pressure and sore throat. ; ; Cardiovascular: Negative for chest pain, palpitations, diaphoresis, dyspnea and peripheral edema. ; ; Respiratory: Negative for cough, wheezing and stridor. ; ; Gastrointestinal: +abd pain. Negative for nausea, vomiting, diarrhea, blood in stool, hematemesis, jaundice and rectal bleeding. . ; ; Genitourinary: Negative for dysuria, flank pain and hematuria. ; ; Musculoskeletal: Negative for back pain  and neck pain. Negative for swelling and trauma.; ; Skin: Negative for pruritus, rash, abrasions, blisters, bruising and skin lesion.; ; Neuro: Negative for headache, lightheadedness and neck stiffness. Negative for weakness, altered level of consciousness , altered mental status, extremity weakness, paresthesias, involuntary movement, seizure and syncope.       Allergies  Review of patient's allergies indicates no known allergies.  Home Medications   Current Outpatient Rx  Name  Route  Sig  Dispense  Refill  . levothyroxine (LEVOTHROID) 25 MCG tablet   Oral   Take 25 mcg by mouth daily.           Marland Kitchen. lisinopril (PRINIVIL,ZESTRIL) 5 MG tablet   Oral   Take 5 mg by mouth daily.           . risperiDONE (RISPERDAL) 0.5 MG tablet   Oral   Take 0.5 mg by mouth at bedtime as needed (sleep). At bedtime          BP 217/0  Pulse 77  Resp 18  SpO2 100% Physical Exam 1210: Physical examination:  Nursing notes reviewed; Vital signs and O2 SAT reviewed;  Constitutional: Well developed, Well nourished, Well hydrated, In no acute distress; Head:  Normocephalic; Eyes: EOMI, No scleral icterus; ENMT: Mucous membranes moist; Neck: Full range of motion; Cardiovascular: Regular rate and rhythm; Respiratory:  Speaking full sentences with ease,  Normal respiratory effort/excursion; Extremities: No deformity.; Neuro: AA&Ox3, No facial droop. Speech clear. Climbs on and off stretcher easily by himself. Gait steady..; Skin: Color normal, Warm, Dry.    ED Course  Procedures    EKG Interpretation   None       MDM  MDM Reviewed: previous chart, nursing note and vitals Reviewed previous: labs     1215:  Pt refuses to change into a hospital gown or let me examine him. States he came to the ED "to find out if I can take tylenol for my pain." Explained again that I would like to examine him and assure that his known hernia is reducible. Pt refuses, stating he "just is going to leave now." The  ED RN and I encouraged pt to be examined and stay in the ED for further workup to evaluate his abd pain. He continues to refuse.  Pt makes his own medical decisions.  Risks of AMA explained to pt, including, but not limited to:  Peritonitis, incarcerated hernia, stroke, heart attack, cardiac arrythmia ("irregular heart rate/beat"), "passing out," temporary and/or permanent disability, death.  Pt verb understanding and continues to refuse physical exam or any further interventions while in the ED, understanding the consequences of his decision.  I encouraged pt to follow up with his PMD and General Surgeon on Monday and return to the ED immediately if symptoms return, worsen, he changes his mind about further examination/testing, or for any other concerns.  Pt verb understanding, agreeable. Pt then walked out of the ED.    Laray Anger, DO 10/11/13 1215

## 2013-10-09 NOTE — ED Notes (Signed)
Pt standing a registration desk stating he has a GPS chip in that needs to come out. He then states he is having stomach pain because he has a hernia and he needs pain medication. States he don't want to pay $ 65.00 to see a doctor. Pt brought back to treatment area. States he don't need a bed because he slept good last night. Pt has papers in hand that he stated he went to [employer] and printed off . Papers have information about GPS enabled chip that can track humans.

## 2013-10-23 ENCOUNTER — Ambulatory Visit (INDEPENDENT_AMBULATORY_CARE_PROVIDER_SITE_OTHER): Payer: Medicare Other | Admitting: Cardiology

## 2013-10-23 ENCOUNTER — Encounter: Payer: Self-pay | Admitting: Cardiology

## 2013-10-23 VITALS — BP 164/80 | HR 74 | Ht 71.0 in | Wt 171.0 lb

## 2013-10-23 DIAGNOSIS — Z01810 Encounter for preprocedural cardiovascular examination: Secondary | ICD-10-CM | POA: Insufficient documentation

## 2013-10-23 DIAGNOSIS — R011 Cardiac murmur, unspecified: Secondary | ICD-10-CM

## 2013-10-23 DIAGNOSIS — I10 Essential (primary) hypertension: Secondary | ICD-10-CM | POA: Insufficient documentation

## 2013-10-23 DIAGNOSIS — E119 Type 2 diabetes mellitus without complications: Secondary | ICD-10-CM | POA: Insufficient documentation

## 2013-10-23 NOTE — Assessment & Plan Note (Signed)
Blood pressure elevated today. He is on ACE inhibitor, followed by Dr. Juanetta GoslingHawkins.

## 2013-10-23 NOTE — Assessment & Plan Note (Signed)
Patient being considered for elective herniorrhaphy. In general a relatively low risk surgery from a cardiac perspective. [name] describes activities exceeding 4 METs without significant limiting symptoms, no exertional chest pain, NYHA class II dyspnea. Recent ECG is nonspecific. He does have a cardiac murmur as outlined above. We will plan to obtain an echocardiogram for further evaluation. Unless he has significant cardiac structural or valvular heart disease, he should be able to proceed with planned surgery at an acceptable perioperative cardiac risk.

## 2013-10-23 NOTE — Progress Notes (Signed)
Clinical Summary [name] is a 80 y.o.male referred by Dr. Malvin Perez for cardiology consultation. He is being considered for an elective right inguinal herniorrhaphy. He is here with his daughter. He reports no history of CAD, myocardial infarction, arrhythmia, or heart failure. He does have a long-standing heart murmur, states that it was diagnosed back in the 1950s during an [employer] physical. He is not aware of ever having undergone an echocardiogram.  From a functional perspective, he describes regular activities exceeding 4 METS without limiting breathlessness or chest pain. He saw Dr. Juanetta Perez recently and tells me that he walked from his house uphill over a mile to that visit.  Recent ECG from January 2015 showed sinus rhythm with decreased R wave progression and left atrial enlargement. Lab work from October 2014 reviewed finding potassium 4.2, BUN 21, creatinine 1.3, hemoglobin 11.9, platelets 181.  No Known Allergies  Current Outpatient Prescriptions  Medication Sig Dispense Refill  . levothyroxine (LEVOTHROID) 25 MCG tablet Take 25 mcg by mouth daily.        Marland Kitchen. lisinopril (PRINIVIL,ZESTRIL) 5 MG tablet Take 5 mg by mouth daily.        . Multiple Vitamin (MULTIVITAMIN) capsule Take 1 capsule by mouth daily.       No current facility-administered medications for this visit.    Past Medical History  Diagnosis Date  . History of goiter   . Essential hypertension, benign   . Paranoia   . Delusional disorder   . Type 2 diabetes mellitus   . Cataracts, bilateral     Past Surgical History  Procedure Laterality Date  . Thyroidectomy  03/02/2011    Left lobectomy and isthmusectomy    Family History  Problem Relation Age of Onset  . Heart attack Father   . Dementia Mother   . Stroke Mother     Social History [name] reports that he has never smoked. He does not have any smokeless tobacco history on file. [name] reports that he does not drink alcohol.  Review  of Systems No orthopnea or PND. No leg edema. Stable appetite. No syncope or palpitations. Otherwise as outlined above.  Physical Examination Filed Vitals:   10/23/13 1437  BP: 164/80  Pulse: 74   Filed Weights   10/23/13 1437  Weight: 171 lb (77.565 kg)   Tall, somewhat disheveled male, no distress. HEENT: Conjunctiva and lids normal, oropharynx clear with poor dentition. Neck: Supple, no elevated JVP or carotid bruits, no thyromegaly. Lungs: Clear to auscultation, nonlabored breathing at rest. Cardiac: Regular rate and rhythm, no S3, possible mid systolic click with mid to late 2/6 systolic murmur best at apex, no pericardial rub. Thorax: Pectus excavatum deformity. Abdomen: Soft, nontender, bowel sounds present, no guarding or rebound. Extremities: No pitting edema, distal pulses 2+. Skin: Warm and dry. Musculoskeletal: No kyphosis. Neuropsychiatric: Alert and oriented x3, affect grossly appropriate.   Problem List and Plan   Preoperative cardiovascular examination Patient being considered for elective herniorrhaphy. In general a relatively low risk surgery from a cardiac perspective. [name] describes activities exceeding 4 METs without significant limiting symptoms, no exertional chest pain, NYHA class II dyspnea. Recent ECG is nonspecific. He does have a cardiac murmur as outlined above. We will plan to obtain an echocardiogram for further evaluation. Unless he has significant cardiac structural or valvular heart disease, he should be able to proceed with planned surgery at an acceptable perioperative cardiac risk.  Cardiac murmur Echocardiogram being obtained to assess valvular  status. Need to exclude MVP or mitral valve disease/regurgitation mainly based on position of murmur.  Essential hypertension, benign Blood pressure elevated today. He is on ACE inhibitor, followed by Dr. Juanetta Gosling.    Jonelle Sidle, M.D., F.A.C.C.

## 2013-10-23 NOTE — Patient Instructions (Addendum)
Your physician recommends that you schedule a follow-up appointment if needed  Your physician has requested that you have an echocardiogram. Echocardiography is a painless test that uses sound waves to create images of your heart. It provides your doctor with information about the size and shape of your heart and how well your heart's chambers and valves are working. This procedure takes approximately one hour. There are no restrictions for this procedure.    We will call you with echo results    Thanks for choosing [facility] !

## 2013-10-23 NOTE — Assessment & Plan Note (Signed)
Echocardiogram being obtained to assess valvular status. Need to exclude MVP or mitral valve disease/regurgitation mainly based on position of murmur.

## 2013-11-02 ENCOUNTER — Other Ambulatory Visit (HOSPITAL_COMMUNITY): Payer: Medicare Other

## 2013-11-03 ENCOUNTER — Emergency Department (HOSPITAL_COMMUNITY): Payer: Medicare Other

## 2013-11-03 ENCOUNTER — Encounter (HOSPITAL_COMMUNITY): Payer: Self-pay | Admitting: Emergency Medicine

## 2013-11-03 ENCOUNTER — Emergency Department (HOSPITAL_COMMUNITY)
Admission: EM | Admit: 2013-11-03 | Discharge: 2013-11-03 | Disposition: A | Discharge status: Home / Self Care | Payer: Medicare Other | Attending: Emergency Medicine | Admitting: Emergency Medicine

## 2013-11-03 DIAGNOSIS — K4091 Unilateral inguinal hernia, without obstruction or gangrene, recurrent: Secondary | ICD-10-CM | POA: Insufficient documentation

## 2013-11-03 DIAGNOSIS — E119 Type 2 diabetes mellitus without complications: Secondary | ICD-10-CM | POA: Insufficient documentation

## 2013-11-03 DIAGNOSIS — F22 Delusional disorders: Secondary | ICD-10-CM | POA: Insufficient documentation

## 2013-11-03 DIAGNOSIS — R109 Unspecified abdominal pain: Secondary | ICD-10-CM

## 2013-11-03 DIAGNOSIS — Z79899 Other long term (current) drug therapy: Secondary | ICD-10-CM | POA: Insufficient documentation

## 2013-11-03 DIAGNOSIS — H269 Unspecified cataract: Secondary | ICD-10-CM | POA: Insufficient documentation

## 2013-11-03 DIAGNOSIS — I714 Abdominal aortic aneurysm, without rupture, unspecified: Secondary | ICD-10-CM | POA: Insufficient documentation

## 2013-11-03 DIAGNOSIS — I10 Essential (primary) hypertension: Secondary | ICD-10-CM | POA: Insufficient documentation

## 2013-11-03 LAB — COMPREHENSIVE METABOLIC PANEL
ALT: 20 U/L (ref 0–53)
AST: 26 U/L (ref 0–37)
Albumin: 3.7 g/dL (ref 3.5–5.2)
Alkaline Phosphatase: 57 U/L (ref 39–117)
BUN: 17 mg/dL (ref 6–23)
CALCIUM: 9.1 mg/dL (ref 8.4–10.5)
CO2: 25 mEq/L (ref 19–32)
CREATININE: 1.22 mg/dL (ref 0.50–1.35)
Chloride: 97 mEq/L (ref 96–112)
GFR calc Af Amer: 63 mL/min — ABNORMAL LOW (ref >90)
GFR calc non Af Amer: 55 mL/min — ABNORMAL LOW (ref >90)
Glucose, Bld: 137 mg/dL — ABNORMAL HIGH (ref 70–99)
Potassium: 4.7 mEq/L (ref 3.7–5.3)
SODIUM: 136 meq/L — AB (ref 137–147)
TOTAL PROTEIN: 7.8 g/dL (ref 6.0–8.3)
Total Bilirubin: 0.3 mg/dL (ref 0.3–1.2)

## 2013-11-03 LAB — CBC WITH DIFFERENTIAL/PLATELET
Basophils Absolute: 0 10*3/uL (ref 0.0–0.1)
Basophils Relative: 0 % (ref 0–1)
EOS PCT: 1 % (ref 0–5)
Eosinophils Absolute: 0.1 10*3/uL (ref 0.0–0.7)
HEMATOCRIT: 37.5 % — AB (ref 39.0–52.0)
HEMOGLOBIN: 12.4 g/dL — AB (ref 13.0–17.0)
LYMPHS ABS: 0.9 10*3/uL (ref 0.7–4.0)
LYMPHS PCT: 9 % — AB (ref 12–46)
MCH: 29.9 pg (ref 26.0–34.0)
MCHC: 33.1 g/dL (ref 30.0–36.0)
MCV: 90.4 fL (ref 78.0–100.0)
Monocytes Absolute: 0.6 10*3/uL (ref 0.1–1.0)
Monocytes Relative: 6 % (ref 3–12)
Neutro Abs: 8.2 10*3/uL — ABNORMAL HIGH (ref 1.7–7.7)
Neutrophils Relative %: 84 % — ABNORMAL HIGH (ref 43–77)
PLATELETS: 209 10*3/uL (ref 150–400)
RBC: 4.15 MIL/uL — AB (ref 4.22–5.81)
RDW: 13.3 % (ref 11.5–15.5)
WBC: 9.8 10*3/uL (ref 4.0–10.5)

## 2013-11-03 LAB — LIPASE, BLOOD: LIPASE: 40 U/L (ref 11–59)

## 2013-11-03 LAB — URINALYSIS, ROUTINE W REFLEX MICROSCOPIC
Bilirubin Urine: NEGATIVE
GLUCOSE, UA: NEGATIVE mg/dL
Hgb urine dipstick: NEGATIVE
Leukocytes, UA: NEGATIVE
Nitrite: NEGATIVE
PH: 6.5 (ref 5.0–8.0)
PROTEIN: NEGATIVE mg/dL
Specific Gravity, Urine: 1.02 (ref 1.005–1.030)
Urobilinogen, UA: 0.2 mg/dL (ref 0.0–1.0)

## 2013-11-03 LAB — LACTIC ACID, PLASMA: Lactic Acid, Venous: 2.3 mmol/L — ABNORMAL HIGH (ref 0.5–2.2)

## 2013-11-03 MED ORDER — ONDANSETRON HCL 4 MG/2ML IJ SOLN
4.0000 mg | Freq: Once | INTRAMUSCULAR | Status: DC
  Filled 2013-11-03: qty 2

## 2013-11-03 MED ORDER — MORPHINE SULFATE 4 MG/ML IJ SOLN
4.0000 mg | Freq: Once | INTRAMUSCULAR | Status: AC
  Administered 2013-11-03: 4 mg via INTRAVENOUS
  Filled 2013-11-03: qty 1

## 2013-11-03 MED ORDER — HYDROCODONE-ACETAMINOPHEN 5-325 MG PO TABS: 1.0000 | ORAL_TABLET | Freq: Four times a day (QID) | ORAL | Status: DC | PRN

## 2013-11-03 MED ORDER — ONDANSETRON HCL 4 MG/2ML IJ SOLN
4.0000 mg | Freq: Once | INTRAMUSCULAR | Status: AC
  Administered 2013-11-03: 4 mg via INTRAVENOUS
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
## 2013-11-03 NOTE — ED Notes (Signed)
Inguinal hernia on right side since June 2014

## 2013-11-03 NOTE — Discharge Instructions (Signed)
Abdominal Aortic Aneurysm You have a 3.9 cm aneurysm.  An aneurysm is a weakened or damaged part of an artery wall that bulges from the normal force of blood pumping through the body. An abdominal aortic aneurysm is an aneurysm that occurs in the lower part of the aorta, the main artery of the body.  The major concern with an abdominal aortic aneurysm is that it can enlarge and burst (rupture) or blood can flow between the layers of the wall of the aorta through a tear (aorticdissection). Both of these conditions can cause bleeding inside the body and can be life threatening unless diagnosed and treated promptly. CAUSES  The exact cause of an abdominal aortic aneurysm is unknown. Some contributing factors are:   A hardening of the arteries caused by the buildup of fat and other substances in the lining of a blood vessel (arteriosclerosis).  Inflammation of the walls of an artery (arteritis).   Connective tissue diseases, such as Marfan syndrome.   Abdominal trauma.   An infection, such as syphilis or staphylococcus, in the wall of the aorta (infectious aortitis) caused by bacteria. RISK FACTORS  Risk factors that contribute to an abdominal aortic aneurysm may include:  Age older than 60 years.   High blood pressure (hypertension).  Male gender.  Ethnicity (white race).  Obesity.  Family history of aneurysm (first degree relatives only).  Tobacco use. PREVENTION  The following healthy lifestyle habits may help decrease your risk of abdominal aortic aneurysm:  Quitting smoking. Smoking can raise your blood pressure and cause arteriosclerosis.  Limiting or avoiding alcohol.  Keeping your blood pressure, blood sugar level, and cholesterol levels within normal limits.  Decreasing your salt intake. In somepeople, too much salt can raise blood pressure and increase your risk of abdominal aortic aneurysm.  Eating a diet low in saturated fats and cholesterol.  Increasing  your fiber intake by including whole grains, vegetables, and fruits in your diet. Eating these foods may help lower blood pressure.  Maintaining a healthy weight.  Staying physically active and exercising regularly. SYMPTOMS  The symptoms of abdominal aortic aneurysm may vary depending on the size and rate of growth of the aneurysm.Most grow slowly and do not have any symptoms. When symptoms do occur, they may include:  Pain (abdomen, side, lower back, or groin). The pain may vary in intensity. A sudden onset of severe pain may indicate that the aneurysm has ruptured.  Feeling full after eating only small amounts of food.  Nausea or vomiting or both.  Feeling a pulsating lump in the abdomen.  Feeling faint or passing out. DIAGNOSIS  Since most unruptured abdominal aortic aneurysms have no symptoms, they are often discovered during diagnostic exams for other conditions. An aneurysm may be found during the following procedures:  Ultrasonography (A one-time screening for abdominal aortic aneurysm by ultrasonography is also recommended for all men aged 65-75 years who have ever smoked).  X-ray exams.  A computed tomography (CT).  Magnetic resonance imaging (MRI).  Angiography or arteriography. TREATMENT  Treatment of an abdominal aortic aneurysm depends on the size of your aneurysm, your age, and risk factors for rupture. Medication to control blood pressure and pain may be used to manage aneurysms smaller than 6 cm. Regular monitoring for enlargement may be recommended by your caregiver if:  The aneurysm is 3 4 cm in size (an annual ultrasonography may be recommended).  The aneurysm is 4 4.5 cm in size (an ultrasonography every 6 months may be recommended).  The aneurysm is larger than 4.5 cm in size (your caregiver may ask that you be examined by a vascular surgeon). If your aneurysm is larger than 6 cm, surgical repair may be recommended. There are two main methods for repair of  an aneurysm:   Endovascular repair (a minimally invasive surgery). This is done most often.  Open repair. This method is used if an endovascular repair is not possible. Document Released: 06/13/2005 Document Revised: 12/29/2012 Document Reviewed: 10/03/2012 [facility] Patient Information 2014 [place], [place].  Inguinal Hernia, Adult Muscles help keep everything in the body in its proper place. But if a weak spot in the muscles develops, something can poke through. That is called a hernia. When this happens in the lower part of the belly (abdomen), it is called an inguinal hernia. (It takes its name from a part of the body in this region called the inguinal canal.) A weak spot in the wall of muscles lets some fat or part of the small intestine bulge through. An inguinal hernia can develop at any age. Men get them more often than women. CAUSES  In adults, an inguinal hernia develops over time.  It can be triggered by:  Suddenly straining the muscles of the lower abdomen.  Lifting heavy objects.  Straining to have a bowel movement. Difficult bowel movements (constipation) can lead to this.  Constant coughing. This may be caused by smoking or lung disease.  Being overweight.  Being pregnant.  Working at a job that requires long periods of standing or heavy lifting.  Having had an inguinal hernia before. One type can be an emergency situation. It is called a strangulated inguinal hernia. It develops if part of the small intestine slips through the weak spot and cannot get back into the abdomen. The blood supply can be cut off. If that happens, part of the intestine may die. This situation requires emergency surgery. SYMPTOMS  Often, a small inguinal hernia has no symptoms. It is found when a healthcare provider does a physical exam. Larger hernias usually have symptoms.   In adults, symptoms may include:  A lump in the groin. This is easier to see when the person is standing. It might  disappear when lying down.  In men, a lump in the scrotum.  Pain or burning in the groin. This occurs especially when lifting, straining or coughing.  A dull ache or feeling of pressure in the groin.  Signs of a strangulated hernia can include:  A bulge in the groin that becomes very painful and tender to the touch.  A bulge that turns red or purple.  Fever, nausea and vomiting.  Inability to have a bowel movement or to pass gas. DIAGNOSIS  To decide if you have an inguinal hernia, a healthcare provider will probably do a physical examination.  This will include asking questions about any symptoms you have noticed.  The healthcare provider might feel the groin area and ask you to cough. If an inguinal hernia is felt, the healthcare provider may try to slide it back into the abdomen.  Usually no other tests are needed. TREATMENT  Treatments can vary. The size of the hernia makes a difference. Options include:  Watchful waiting. This is often suggested if the hernia is small and you have had no symptoms.  No medical procedure will be done unless symptoms develop.  You will need to watch closely for symptoms. If any occur, contact your healthcare provider right away.  Surgery. This is used if  the hernia is larger or you have symptoms.  Open surgery. This is usually an outpatient procedure (you will not stay overnight in a hospital). An cut (incision) is made through the skin in the groin. The hernia is put back inside the abdomen. The weak area in the muscles is then repaired by herniorrhaphy or hernioplasty. Herniorrhaphy: in this type of surgery, the weak muscles are sewn back together. Hernioplasty: a patch or mesh is used to close the weak area in the abdominal wall.  Laparoscopy. In this procedure, a surgeon makes small incisions. A thin tube with a tiny video camera (called a laparoscope) is put into the abdomen. The surgeon repairs the hernia with mesh by looking with the  video camera and using two long instruments. HOME CARE INSTRUCTIONS   After surgery to repair an inguinal hernia:  You will need to take pain medicine prescribed by your healthcare provider. Follow all directions carefully.  You will need to take care of the wound from the incision.  Your activity will be restricted for awhile. This will probably include no heavy lifting for several weeks. You also should not do anything too active for a few weeks. When you can return to work will depend on the type of job that you have.  During "watchful waiting" periods, you should:  Maintain a healthy weight.  Eat a diet high in fiber (fruits, vegetables and whole grains).  Drink plenty of fluids to avoid constipation. This means drinking enough water and other liquids to keep your urine clear or pale yellow.  Do not lift heavy objects.  Do not stand for long periods of time.  Quit smoking. This should keep you from developing a frequent cough. SEEK MEDICAL CARE IF:   A bulge develops in your groin area.  You feel pain, a burning sensation or pressure in the groin. This might be worse if you are lifting or straining.  You develop a fever of more than 100.5 F (38.1 C). SEEK IMMEDIATE MEDICAL CARE IF:   Pain in the groin increases suddenly.  A bulge in the groin gets bigger suddenly and does not go down.  For men, there is sudden pain in the scrotum. Or, the size of the scrotum increases.  A bulge in the groin area becomes red or purple and is painful to touch.  You have nausea or vomiting that does not go away.  You feel your heart beating much faster than normal.  You cannot have a bowel movement or pass gas.  You develop a fever of more than 102.0 F (38.9 C). Document Released: 01/20/2009 Document Revised: 11/26/2011 Document Reviewed: 01/20/2009 [facility] Patient Information 2014 [place], [place].

## 2013-11-10 ENCOUNTER — Ambulatory Visit (HOSPITAL_COMMUNITY)
Admission: RE | Admit: 2013-11-10 | Discharge: 2013-11-10 | Disposition: A | Discharge status: Home / Self Care | Payer: Medicare Other | Source: Ambulatory Visit | Attending: Cardiovascular Disease | Admitting: Cardiovascular Disease

## 2013-11-10 DIAGNOSIS — R011 Cardiac murmur, unspecified: Secondary | ICD-10-CM | POA: Insufficient documentation

## 2013-11-10 DIAGNOSIS — I319 Disease of pericardium, unspecified: Secondary | ICD-10-CM | POA: Insufficient documentation

## 2013-11-10 DIAGNOSIS — E119 Type 2 diabetes mellitus without complications: Secondary | ICD-10-CM | POA: Insufficient documentation

## 2013-11-10 DIAGNOSIS — I10 Essential (primary) hypertension: Secondary | ICD-10-CM | POA: Insufficient documentation

## 2013-11-10 DIAGNOSIS — I359 Nonrheumatic aortic valve disorder, unspecified: Secondary | ICD-10-CM

## 2013-11-10 NOTE — Progress Notes (Signed)
*  PRELIMINARY RESULTS* Echocardiogram 2D Echocardiogram has been performed.  Dan Perez 11/10/2013, 1:55 PM

## 2013-11-23 ENCOUNTER — Encounter: Payer: Self-pay | Admitting: Vascular Surgery

## 2013-11-24 ENCOUNTER — Encounter: Payer: Self-pay | Admitting: Vascular Surgery

## 2013-11-24 ENCOUNTER — Ambulatory Visit (INDEPENDENT_AMBULATORY_CARE_PROVIDER_SITE_OTHER): Payer: Medicare Other | Admitting: Vascular Surgery

## 2013-11-24 VITALS — BP 174/84 | HR 76 | Resp 16 | Ht 73.0 in | Wt 170.0 lb

## 2013-11-24 DIAGNOSIS — I714 Abdominal aortic aneurysm, without rupture, unspecified: Secondary | ICD-10-CM | POA: Insufficient documentation

## 2013-11-24 NOTE — Progress Notes (Signed)
Subjective:     Patient ID: Dan Perez E Gerety, male   DOB: [birth year], 80 y.o.   MRN: [id number]  HPI this 80 year old male was referred by Dr. Juanetta GoslingHawkins for evaluation of the small bowel aortic aneurysm recently discovered in the emergency department. Patient was having abdominal discomfort and an ultrasound exam was performed which revealed an aortic aneurysm measuring 3.9 cm in maximum diameter. Patient's pain resolved within 5 hours and has not reoccurred. He's had no change in his bowel habits. His appetite has been good.  Past Medical History  Diagnosis Date  . History of goiter   . Essential hypertension, benign   . Paranoia   . Delusional disorder   . Type 2 diabetes mellitus   . Cataracts, bilateral     History  Substance Use Topics  . Smoking status: Never Smoker   . Smokeless tobacco: Not on file  . Alcohol Use: No    Family History  Problem Relation Age of Onset  . Heart attack Father   . Dementia Mother   . Stroke Mother     No Known Allergies  Current outpatient prescriptions:furosemide (LASIX) 20 MG tablet, Take 20 mg by mouth., Disp: , Rfl: ;  levothyroxine (LEVOTHROID) 25 MCG tablet, Take 25 mcg by mouth daily.  , Disp: , Rfl: ;  lisinopril (PRINIVIL,ZESTRIL) 5 MG tablet, Take 5 mg by mouth daily.  , Disp: , Rfl: ;  Multiple Vitamin (MULTIVITAMIN) capsule, Take 1 capsule by mouth daily., Disp: , Rfl:  HYDROcodone-acetaminophen (NORCO/VICODIN) 5-325 MG per tablet, Take 1 tablet by mouth every 6 (six) hours as needed., Disp: 6 tablet, Rfl: 0  BP 174/84  Pulse 76  Resp 16  Ht 6\' 1"  (1.854 m)  Wt 170 lb (77.111 kg)  BMI 22.43 kg/m2  Body mass index is 22.43 kg/(m^2).          Review of Systems denies chest pain, dyspnea on exertion, PND, orthopnea, hemoptysis, claudication, lateralizing weakness, aphasia, amaurosis fugax, diplopia, blurred vision, syncope. Patient does have a large inguinal hernia which needs repair and has history of hypertension and  previous thyroidectomy. Other systems negative and a complete review of systems    Objective:   Physical Exam BP 174/84  Pulse 76  Resp 16  Ht 6\' 1"  (1.854 m)  Wt 170 lb (77.111 kg)  BMI 22.43 kg/m2  Gen.-alert and oriented x3 in no apparent distress HEENT normal for age Lungs no rhonchi or wheezing Cardiovascular regular rhythm no murmurs carotid pulses 3+ palpable no bruits audible Abdomen soft nontender no palpable masses Musculoskeletal free of  major deformities Skin clear -no rashes Neurologic normal Lower extremities 3+ femoral and dorsalis pedis pulses palpable bilaterally with chronic 1+ edema bilaterally. Hyperpigmentation lower third of both lower extremities. No bulging varicosities noted.  Today I reviewed the report of the aortic ultrasound which revealed a 3.9 cm abdominal aortic aneurysm in the infrarenal area       Assessment:     Small-3.9 cm-abdominal aortic aneurysm    Plan:     Return in one year with duplex scan of the abdominal aorta to follow size of aneurysm The patient developed severe abdominal or back or flank pain he will report to the emergency department for CT scan

## 2013-11-24 NOTE — Addendum Note (Signed)
Addended by: Adria DillELDRIDGE-LEWIS, Chaunda Vandergriff L on: 11/24/2013 04:26 PM   Modules accepted: Orders

## 2013-12-06 ENCOUNTER — Emergency Department (HOSPITAL_COMMUNITY)
Admission: EM | Admit: 2013-12-06 | Discharge: 2013-12-06 | Disposition: A | Discharge status: Home / Self Care | Payer: Medicare Other | Attending: Emergency Medicine | Admitting: Emergency Medicine

## 2013-12-06 ENCOUNTER — Encounter (HOSPITAL_COMMUNITY): Payer: Self-pay | Admitting: Emergency Medicine

## 2013-12-06 ENCOUNTER — Emergency Department (HOSPITAL_COMMUNITY): Payer: Medicare Other

## 2013-12-06 DIAGNOSIS — I10 Essential (primary) hypertension: Secondary | ICD-10-CM | POA: Insufficient documentation

## 2013-12-06 DIAGNOSIS — Z8659 Personal history of other mental and behavioral disorders: Secondary | ICD-10-CM | POA: Insufficient documentation

## 2013-12-06 DIAGNOSIS — Z8669 Personal history of other diseases of the nervous system and sense organs: Secondary | ICD-10-CM | POA: Insufficient documentation

## 2013-12-06 DIAGNOSIS — N390 Urinary tract infection, site not specified: Secondary | ICD-10-CM | POA: Insufficient documentation

## 2013-12-06 DIAGNOSIS — Z79899 Other long term (current) drug therapy: Secondary | ICD-10-CM | POA: Insufficient documentation

## 2013-12-06 DIAGNOSIS — E119 Type 2 diabetes mellitus without complications: Secondary | ICD-10-CM | POA: Insufficient documentation

## 2013-12-06 DIAGNOSIS — Z8639 Personal history of other endocrine, nutritional and metabolic disease: Secondary | ICD-10-CM | POA: Insufficient documentation

## 2013-12-06 DIAGNOSIS — Z862 Personal history of diseases of the blood and blood-forming organs and certain disorders involving the immune mechanism: Secondary | ICD-10-CM | POA: Insufficient documentation

## 2013-12-06 LAB — CBC WITH DIFFERENTIAL/PLATELET
BASOS ABS: 0 10*3/uL (ref 0.0–0.1)
BASOS PCT: 1 % (ref 0–1)
Eosinophils Absolute: 0.3 10*3/uL (ref 0.0–0.7)
Eosinophils Relative: 5 % (ref 0–5)
HCT: 35.2 % — ABNORMAL LOW (ref 39.0–52.0)
HEMOGLOBIN: 11.3 g/dL — AB (ref 13.0–17.0)
Lymphocytes Relative: 20 % (ref 12–46)
Lymphs Abs: 1.3 10*3/uL (ref 0.7–4.0)
MCH: 29.5 pg (ref 26.0–34.0)
MCHC: 32.1 g/dL (ref 30.0–36.0)
MCV: 91.9 fL (ref 78.0–100.0)
Monocytes Absolute: 0.6 10*3/uL (ref 0.1–1.0)
Monocytes Relative: 9 % (ref 3–12)
NEUTROS ABS: 4.2 10*3/uL (ref 1.7–7.7)
Neutrophils Relative %: 66 % (ref 43–77)
Platelets: 207 10*3/uL (ref 150–400)
RBC: 3.83 MIL/uL — ABNORMAL LOW (ref 4.22–5.81)
RDW: 12.9 % (ref 11.5–15.5)
WBC: 6.4 10*3/uL (ref 4.0–10.5)

## 2013-12-06 LAB — URINALYSIS, ROUTINE W REFLEX MICROSCOPIC
BILIRUBIN URINE: NEGATIVE
GLUCOSE, UA: NEGATIVE mg/dL
KETONES UR: NEGATIVE mg/dL
Nitrite: POSITIVE — AB
PH: 6 (ref 5.0–8.0)
PROTEIN: NEGATIVE mg/dL
Specific Gravity, Urine: 1.02 (ref 1.005–1.030)
Urobilinogen, UA: 0.2 mg/dL (ref 0.0–1.0)

## 2013-12-06 LAB — COMPREHENSIVE METABOLIC PANEL
ALBUMIN: 3.2 g/dL — AB (ref 3.5–5.2)
ALK PHOS: 52 U/L (ref 39–117)
ALT: 17 U/L (ref 0–53)
AST: 24 U/L (ref 0–37)
BUN: 13 mg/dL (ref 6–23)
CO2: 28 mEq/L (ref 19–32)
Calcium: 9 mg/dL (ref 8.4–10.5)
Chloride: 99 mEq/L (ref 96–112)
Creatinine, Ser: 1.27 mg/dL (ref 0.50–1.35)
GFR calc Af Amer: 60 mL/min — ABNORMAL LOW (ref >90)
GFR, EST NON AFRICAN AMERICAN: 52 mL/min — AB (ref >90)
Glucose, Bld: 105 mg/dL — ABNORMAL HIGH (ref 70–99)
POTASSIUM: 4.6 meq/L (ref 3.7–5.3)
Sodium: 137 mEq/L (ref 137–147)
Total Bilirubin: 0.3 mg/dL (ref 0.3–1.2)
Total Protein: 7.1 g/dL (ref 6.0–8.3)

## 2013-12-06 LAB — URINE MICROSCOPIC-ADD ON

## 2013-12-06 LAB — LIPASE, BLOOD: Lipase: 50 U/L (ref 11–59)

## 2013-12-06 MED ORDER — ONDANSETRON HCL 4 MG/2ML IJ SOLN
4.0000 mg | Freq: Once | INTRAMUSCULAR | Status: AC
  Administered 2013-12-06: 4 mg via INTRAVENOUS
  Filled 2013-12-06: qty 2

## 2013-12-06 MED ORDER — CEFTRIAXONE SODIUM 1 G IJ SOLR
1.0000 g | Freq: Once | INTRAMUSCULAR | Status: AC
  Administered 2013-12-06: 1 g via INTRAVENOUS
  Filled 2013-12-06: qty 10

## 2013-12-06 MED ORDER — SODIUM CHLORIDE 0.9 % IV BOLUS (SEPSIS)
500.0000 mL | Freq: Once | INTRAVENOUS | Status: AC
  Administered 2013-12-06: 500 mL via INTRAVENOUS

## 2013-12-06 MED ORDER — CIPROFLOXACIN HCL 500 MG PO TABS: 500.0000 mg | ORAL_TABLET | Freq: Two times a day (BID) | ORAL | Status: DC

## 2013-12-06 MED ORDER — HYDROCODONE-ACETAMINOPHEN 5-325 MG PO TABS: 1.0000 | ORAL_TABLET | ORAL | Status: DC | PRN

## 2013-12-06 NOTE — ED Notes (Signed)
Pt c/o generalized abdominal pain that began this morning. Pt describes pain as cramping and twisting. Pt reports nausea but denies v/d. Pt's last BM was last night.

## 2013-12-06 NOTE — ED Notes (Signed)
Patient given coke cola per request and EDP's approval, tolerating well.

## 2013-12-06 NOTE — ED Provider Notes (Signed)
CSN: [id number]     Arrival date & time 12/06/13  [id number] History  This chart was scribed for Dan HutchingBrian Kylena Mole, MD by Beverly MilchJ Harrison Collins, ED Scribe. This patient was seen in room APA05/APA05 and the patient's care was started at 9:00 AM.   Chief Complaint  Patient presents with  . Abdominal Pain     The history is provided by the patient. No language interpreter was used.   HPI Comments: Laurian BrimLawrence E Pawlicki is a 80 y.o. male who presents to the Emergency Department complaining of mild lower abdominal pain starting this morning. No fever, chills, dysuria, flank pain, nausea, vomiting and diarrhea. Severity is mild. Nothing makes symptoms better or worse. No radiation of pain.  Past Medical History  Diagnosis Date  . History of goiter   . Essential hypertension, benign   . Paranoia   . Delusional disorder   . Type 2 diabetes mellitus   . Cataracts, bilateral     Past Surgical History  Procedure Laterality Date  . Thyroidectomy  03/02/2011    Left lobectomy and isthmusectomy    Family History  Problem Relation Age of Onset  . Heart attack Father   . Dementia Mother   . Stroke Mother     History  Substance Use Topics  . Smoking status: Never Smoker   . Smokeless tobacco: Not on file  . Alcohol Use: No    Review of Systems  All other systems reviewed and are negative.   A complete 10 system review of systems was obtained and all systems are negative except as noted in the HPI and PMH.    Allergies  Review of patient's allergies indicates no known allergies.  Home Medications   Current Outpatient Rx  Name  Route  Sig  Dispense  Refill  . furosemide (LASIX) 20 MG tablet   Oral   Take 20 mg by mouth.         Marland Kitchen. HYDROcodone-acetaminophen (NORCO/VICODIN) 5-325 MG per tablet   Oral   Take 1 tablet by mouth every 6 (six) hours as needed.   6 tablet   0   . levothyroxine (LEVOTHROID) 25 MCG tablet   Oral   Take 25 mcg by mouth daily.           Marland Kitchen. lisinopril  (PRINIVIL,ZESTRIL) 5 MG tablet   Oral   Take 5 mg by mouth daily.           . Multiple Vitamin (MULTIVITAMIN) capsule   Oral   Take 1 capsule by mouth daily.           Triage Vitals: BP 171/63  Pulse 65  Temp(Src) 98.8 F (37.1 C) (Oral)  Resp 20  SpO2 98%  Physical Exam  Nursing note and vitals reviewed. Constitutional: He is oriented to person, place, and time. He appears well-developed and well-nourished.  HENT:  Head: Normocephalic and atraumatic.  Eyes: Conjunctivae and EOM are normal. Pupils are equal, round, and reactive to light.  Neck: Normal range of motion. Neck supple.  Cardiovascular: Normal rate, regular rhythm and normal heart sounds.   Pulmonary/Chest: Effort normal and breath sounds normal.  Abdominal: Soft. Bowel sounds are normal.  Right inguinal hernia (old)  Musculoskeletal: Normal range of motion.  Neurological: He is alert and oriented to person, place, and time.  Skin: Skin is warm and dry.  Psychiatric: He has a normal mood and affect. His behavior is normal.    ED Course  Procedures (including critical care time)  DIAGNOSTIC STUDIES: Oxygen Saturation is 98% on RA, normal by my interpretation.    COORDINATION OF CARE: 8:59 AM- Pt advised of plan for treatment and pt agrees.   Labs Review  Labs Reviewed  CBC WITH DIFFERENTIAL - Abnormal; Notable for the following:    RBC 3.83 (*)    Hemoglobin 11.3 (*)    HCT 35.2 (*)    All other components within normal limits  COMPREHENSIVE METABOLIC PANEL - Abnormal; Notable for the following:    Glucose, Bld 105 (*)    Albumin 3.2 (*)    GFR calc non Af Amer 52 (*)    GFR calc Af Amer 60 (*)    All other components within normal limits  URINALYSIS, ROUTINE W REFLEX MICROSCOPIC - Abnormal; Notable for the following:    APPearance CLOUDY (*)    Hgb urine dipstick MODERATE (*)    Nitrite POSITIVE (*)    Leukocytes, UA LARGE (*)    All other components within normal limits  URINE  MICROSCOPIC-ADD ON - Abnormal; Notable for the following:    Bacteria, UA MANY (*)    All other components within normal limits  URINE CULTURE  LIPASE, BLOOD   Imaging Review Dg Abd Acute W/chest  12/06/2013   CLINICAL DATA:  Abdominal pain  EXAM: ACUTE ABDOMEN SERIES (ABDOMEN 2 VIEW & CHEST 1 VIEW)  COMPARISON:  Abdominal radiographs dated 11/03/2013  FINDINGS: Lungs are clear. No pleural effusion or pneumothorax.  The heart is normal in size.  Nonspecific bowel gas pattern without disproportionate small bowel dilatation to suggest small bowel obstruction.  No evidence of free air under the diaphragm on the upright view.  Degenerative changes of the visualized thoracolumbar spine with levoscoliosis.  Vascular calcifications.  IMPRESSION: No evidence of acute cardiopulmonary disease.  No evidence of small bowel obstruction or free air.   Electronically Signed   By: Charline Bills M.D.   On: 12/06/2013 08:27     EKG Interpretation None      MDM   Final diagnoses:  None    Patient is nontoxic. Urine is infected. We'll start Rocephin 1 g IV. Discharge medications Cipro other milligrams twice a day for one week. Patient has primary care followup.  I personally performed the services described in this documentation, which was scribed in my presence. The recorded information has been reviewed and is accurate.    Dan Hutching, MD 12/06/13 [phone]

## 2013-12-06 NOTE — ED Notes (Signed)
Pt states that his pain is "man made" and "there is an outside force making my stomach hurt".

## 2013-12-06 NOTE — Discharge Instructions (Signed)
You have a urinary tract infection. Will need antibiotic twice a day for one week. Followup your primary care Dr. Bonita QuinYou can start prescription tomorrow morning.

## 2013-12-08 ENCOUNTER — Encounter (HOSPITAL_COMMUNITY): Payer: Self-pay | Admitting: Pharmacy Technician

## 2013-12-08 LAB — URINE CULTURE

## 2013-12-11 ENCOUNTER — Emergency Department (HOSPITAL_COMMUNITY)
Admission: EM | Admit: 2013-12-11 | Discharge: 2013-12-11 | Discharge status: Against Medical Advice | Payer: Medicare Other | Attending: Emergency Medicine | Admitting: Emergency Medicine

## 2013-12-11 ENCOUNTER — Encounter (HOSPITAL_COMMUNITY): Payer: Self-pay | Admitting: Emergency Medicine

## 2013-12-11 DIAGNOSIS — Z792 Long term (current) use of antibiotics: Secondary | ICD-10-CM | POA: Insufficient documentation

## 2013-12-11 DIAGNOSIS — E119 Type 2 diabetes mellitus without complications: Secondary | ICD-10-CM | POA: Insufficient documentation

## 2013-12-11 DIAGNOSIS — G8929 Other chronic pain: Secondary | ICD-10-CM | POA: Insufficient documentation

## 2013-12-11 DIAGNOSIS — Z8719 Personal history of other diseases of the digestive system: Secondary | ICD-10-CM | POA: Insufficient documentation

## 2013-12-11 DIAGNOSIS — Z87891 Personal history of nicotine dependence: Secondary | ICD-10-CM | POA: Insufficient documentation

## 2013-12-11 DIAGNOSIS — Z79899 Other long term (current) drug therapy: Secondary | ICD-10-CM | POA: Insufficient documentation

## 2013-12-11 DIAGNOSIS — I10 Essential (primary) hypertension: Secondary | ICD-10-CM | POA: Insufficient documentation

## 2013-12-11 DIAGNOSIS — F22 Delusional disorders: Secondary | ICD-10-CM | POA: Insufficient documentation

## 2013-12-11 HISTORY — DX: Abdominal aortic aneurysm, without rupture, unspecified: I71.40

## 2013-12-11 HISTORY — DX: Unilateral inguinal hernia, without obstruction or gangrene, not specified as recurrent: K40.90

## 2013-12-11 HISTORY — DX: Unspecified abdominal pain: R10.9

## 2013-12-11 HISTORY — DX: Abdominal aortic aneurysm, without rupture: I71.4

## 2013-12-11 HISTORY — DX: Other chronic pain: G89.29

## 2013-12-11 NOTE — ED Notes (Signed)
EDP at the bedside.  ?

## 2013-12-11 NOTE — ED Notes (Signed)
Pt left after speaking with EDP

## 2013-12-11 NOTE — Consult Note (Signed)
NAMEEREZ, MCCALLUM NO.:  [id number]  MEDICAL RECORD NO.:  [id number]  LOCATION:                                 FACILITY:  PHYSICIAN:  Barbaraann Barthel, M.D. DATE OF BIRTH:  02-14-1932  DATE OF CONSULTATION:  12/08/2013 DATE OF DISCHARGE:                                CONSULTATION   NOTE:  Surgery was asked to see this 80 year old white male for repair of a right inguinal hernia.  I saw him back in January with this and he stated at that time, he had this hernia present for at least 8 months. His surgery was postponed for cardiac clearance and medical clearance. He saw Dr. Juanetta Gosling and he saw the [facility] cardiologist to clear him from cardiac standpoint.  He was cleared by them and we had planned to do surgery on him thereafter.  PAST MEDICAL HISTORY:  Positive for hypertension and also he has had hypothyroidism and he at times also suffers from sort of chronic cellulitis.  PAST SURGICAL HISTORY:  Thyroidectomy in 2013, and he has had bilateral cataract surgery.  MEDICATIONS:  For his medication list, see medication list.  ALLERGIES:  He has no known allergies.  SOCIAL HISTORY:  He used to smoke, but he stopped smoking in 1984.  He does not abuse alcohol.  PHYSICAL EXAMINATION:  GENERAL:  He appears disheveled, disorganized, and unclean.  He is rather sparse with his showers and we have had a conversation about the need to take a shower prior to surgery. VITAL SIGNS:  He is 6 feet 1 inch tall.  Weighs 180 pounds.  His temperature is 98.3.  His heart rate is 80 per minute, respirations 12, his blood pressure is now 130/64. HEENT:  Head is normocephalic.  Eyes, extraocular movements are intact. His pupils were reactive to light and accommodation.  He has dental prosthesis and very poor dentition in general. NECK:  He has no obvious bruits.  He has some mild jugular vein distention.  No adenopathy appreciated. CHEST:  He has pectus  excavatum.  His lungs are fairly clear. HEART:  There is a systolic murmur 2/6 appreciated.  This is heard over the apex. ABDOMEN:  Soft.  There is no visceromegaly appreciated.  He has a large reducible right inguinal hernia. RECTAL:  Deferred. EXTREMITIES:  He has chronic cellulitis and he has +2 pitting edema.  REVIEW OF SYSTEMS:  NEUROLOGIC:  No history of migraines or seizures or lateralizing neurological findings.  He is however disoriented.  He believes that he has been invaded by aliens who are controlling his movements by satellite and computer and they are trying to displace him from his home.  Despite these delusions, he does not appear violent or any partake of any violent activity.  He has had a psychiatric evaluation regarding this and he is on no psychiatric medications at present.  ENDOCRINE:  As stated, he has hypothyroidism.  No history of diabetes or adrenal problems.  CARDIOPULMONARY:  History of hypertension.  He has a heart murmur noted.  MUSCULOSKELETAL:  Grossly within normal limits for age other than the pectus excavatum which is noted.  GI:  No past history of  hepatitis.  He has no past history of constipation, diarrhea, bright red rectal bleeding, or melena.  No history of inflammatory bowel disease or irritable bowel syndrome.  No unexplained weight loss.  I told him the importance of having a colonoscopy but at his age, he is likely not going to follow through with any of this.  GU:  He has some mild symptoms of prostatism.  He has no dysuria or history of kidney stones.  REVIEW OF HISTORY AND PHYSICAL:  Therefore, Mr. Johna Sheriffickle is an interesting specimen.  He will undergo right inguinal hernia repair for rather a large and symptomatic right inguinal hernia which is reducible.  We will take care of this via the outpatient department.  Now, that he has been cleared by the Medical Cardiology Department.  We discussed complications with him and his daughter,  discussing complications including bleeding, infection, recurrence, and the possibility that mesh prosthesis might be required, and I discussed the need for with his daughters for them to be with him while he is in the hospital overnight as he may become disoriented with these delusions that he has.  We will follow him perioperatively, and then he is to return to Dr. Juanetta GoslingHawkins' able care.     Barbaraann BarthelWilliam Terrace Fontanilla, M.D.     WB/MEDQ  D:  12/08/2013  T:  12/08/2013  Job:  [id number]  cc:   Dr. Juanetta GoslingHawkins

## 2013-12-11 NOTE — ED Notes (Addendum)
Pt is in hospital room in no apparent distress

## 2013-12-11 NOTE — ED Notes (Signed)
Pt reporting abdominal pain all over. Pt states he has a implant & criminals have plotted his house.

## 2013-12-11 NOTE — ED Provider Notes (Signed)
CSN: [id number]     Arrival date & time 12/11/13  [id number] History   First MD Initiated Contact with Patient 12/11/13 0710     Chief Complaint  Patient presents with  . Delusional      HPI Pt was seen at 0720. Per pt, c/o gradual onset and persistence of constant upper abd "pain" for the past several years. Pt states the pain began "after they inserted a microchip there." States "they make the pain come and go as they please through the microchip" and "I have no control over it." States he came to the ED today "so you can take the microchip out." Denies any change in his usual chronic pain pattern. Denies CP/SOB, no N/V/D, no back pain, no fevers.      Past Medical History  Diagnosis Date  . History of goiter   . Essential hypertension, benign   . Paranoia   . Delusional disorder   . Type 2 diabetes mellitus   . Cataracts, bilateral   . Chronic abdominal pain   . AAA (abdominal aortic aneurysm) without rupture     US (11/03/2013): 3.9 x 3.3 cm  . Right inguinal hernia     to see Dr. Malvin JohnsBradford for repair   Past Surgical History  Procedure Laterality Date  . Thyroidectomy  03/02/2011    Left lobectomy and isthmusectomy   Family History  Problem Relation Age of Onset  . Heart attack Father   . Dementia Mother   . Stroke Mother    History  Substance Use Topics  . Smoking status: Never Smoker   . Smokeless tobacco: Not on file  . Alcohol Use: No    Review of Systems ROS: Statement: All systems negative except as marked or noted in the HPI; Constitutional: Negative for fever and chills. ; ; Eyes: Negative for eye pain, redness and discharge. ; ; ENMT: Negative for ear pain, hoarseness, nasal congestion, sinus pressure and sore throat. ; ; Cardiovascular: Negative for chest pain, palpitations, diaphoresis, dyspnea and peripheral edema. ; ; Respiratory: Negative for cough, wheezing and stridor. ; ; Gastrointestinal: +abd pain. Negative for nausea, vomiting, diarrhea, blood in stool,  hematemesis, jaundice and rectal bleeding. . ; ; Genitourinary: Negative for dysuria, flank pain and hematuria. ; ; Musculoskeletal: Negative for back pain and neck pain. Negative for swelling and trauma.; ; Skin: Negative for pruritus, rash, abrasions, blisters, bruising and skin lesion.; ; Neuro: Negative for headache, lightheadedness and neck stiffness. Negative for weakness, altered level of consciousness , altered mental status, extremity weakness, paresthesias, involuntary movement, seizure and syncope.; Psych:  +fixed delusion. No SI, no SA, no HI, no A/V hallucinations.   Allergies  Review of patient's allergies indicates no known allergies.  Home Medications   Current Outpatient Rx  Name  Route  Sig  Dispense  Refill  . ciprofloxacin (CIPRO) 500 MG tablet   Oral   Take 1 tablet (500 mg total) by mouth 2 (two) times daily. One po bid x 7 days   14 tablet   0   . furosemide (LASIX) 20 MG tablet   Oral   Take 20 mg by mouth daily as needed for fluid.          Marland Kitchen. HYDROcodone-acetaminophen (NORCO) 5-325 MG per tablet   Oral   Take 1 tablet by mouth every 4 (four) hours as needed.   6 tablet   0   . HYDROcodone-acetaminophen (NORCO/VICODIN) 5-325 MG per tablet   Oral   Take 1  tablet by mouth every 6 (six) hours as needed.   6 tablet   0   . levothyroxine (LEVOTHROID) 25 MCG tablet   Oral   Take 25 mcg by mouth daily.           Marland Kitchen lisinopril (PRINIVIL,ZESTRIL) 5 MG tablet   Oral   Take 5 mg by mouth daily.           . Multiple Vitamins-Minerals (MULTIVITAMINS THER. W/MINERALS) TABS tablet   Oral   Take 1 tablet by mouth daily.          BP 185/90  Pulse 90  Temp(Src) 97.5 F (36.4 C) (Oral)  Resp 16  Ht 5\' 10"  (1.778 m)  Wt 165 lb (74.844 kg)  BMI 23.68 kg/m2  SpO2 98% Physical Exam 0725: Physical examination:  Nursing notes reviewed; Vital signs and O2 SAT reviewed;  Constitutional: Well developed, Well nourished, Well hydrated, In no acute distress;  Head:  Normocephalic, atraumatic; Eyes: EOMI, PERRL, No scleral icterus; ENMT: Mouth and pharynx normal, Mucous membranes moist; Neck: Supple, Full range of motion, No lymphadenopathy; Cardiovascular: Regular rate and rhythm, No murmur, rub, or gallop; Respiratory: Breath sounds clear & equal bilaterally, No rales, rhonchi, wheezes.  Speaking full sentences with ease, Normal respiratory effort/excursion; Chest: Nontender, Movement normal; Abdomen: Soft, Nontender, Nondistended, Normal bowel sounds; Genitourinary: No CVA tenderness; Extremities: Pulses normal, No tenderness, +2 pedal edema bilat with chronic stasis changes, no calf asymmetry.; Neuro: AA&Ox3, Major CN grossly intact.  Speech clear. No gross focal motor or sensory deficits in extremities. Climbs on and off stretcher easily by himself. Gait steady.; Skin: Color normal, Warm, Dry.; Psych:  Fixed delusion c/w hx of same.     ED Course  Procedures     EKG Interpretation None      MDM  MDM Reviewed: previous chart, nursing note and vitals Reviewed previous: labs, ECG, x-ray, ultrasound and CT scan     0730:  Pt states he came to the ED today "so you can remove this microchip from my stomach." ED RN and I explained that there was no microchip to remove but that we could perform further testing to reassure there was no acute cause for his pain. Pt stated that he "just wanted the microchip removed," that was "all he was here for" and then left the ED.    Laray Anger, DO 12/13/13 1507

## 2013-12-14 ENCOUNTER — Encounter (HOSPITAL_COMMUNITY): Payer: Self-pay

## 2013-12-14 ENCOUNTER — Encounter (HOSPITAL_COMMUNITY)
Admission: RE | Admit: 2013-12-14 | Discharge: 2013-12-14 | Disposition: A | Discharge status: Home / Self Care | Payer: Medicare Other | Source: Ambulatory Visit | Attending: General Surgery | Admitting: General Surgery

## 2013-12-14 NOTE — Patient Instructions (Addendum)
Your procedure is scheduled on: 12/17/13  Report to [facility] at   7:30  AM.  Call this number if you have problems the morning of surgery: [phone]   Remember:   Do not drink or eat food:After Midnight.  :  Take these medicines the morning of surgery with A SIP OF WATER: Lisinopril and Levothyroxine    Do not wear jewelry, make-up or nail polish.  Do not wear lotions, powders, or perfumes. You may wear deodorant.  Do not shave 48 hours prior to surgery. Men may shave face and neck.  Do not bring valuables to the hospital.  Contacts, dentures or bridgework may not be worn into surgery.  Leave suitcase in the car. After surgery it may be brought to your room.  For patients admitted to the hospital, checkout time is 11:00 AM the day of discharge.   Patients discharged the day of surgery will not be allowed to drive home.    Special Instructions: Shower using CHG night before surgery and shower the day of surgery use CHG.  Use special wash - you have one bottle of CHG for all showers.  You should use approximately 1/2 of the bottle for each shower.   Please read over the following fact sheets that you were given: Pain Booklet, MRSA Information, Surgical Site Infection Prevention and Care and Recovery After Surgery  Hernia Repair Care After These instructions give you information on caring for yourself after your procedure. Your doctor may also give you more specific instructions. Call your doctor if you have any problems or questions after your procedure. HOME CARE   You may have changes in your poops (bowel movements).  You may have loose or watery poop (diarrhea).  You may be not able to poop.  Your bowels will slowly get back to normal.  Do not eat any food that makes you sick to your stomach (nauseous). Eat small meals 4 to 6 times a day instead of 3 large ones.  Do not drink pop. It will give you gas.  Do not drink alcohol.  Do not lift anything heavier than 10 pounds.  This is about the weight of a gallon of milk.  Do not do anything that makes you very tired for at least 6 weeks.  Do not get your wound wet for 2 days.  You may take a sponge bath during this time.  After 2 days you may take a shower. Gently pat your surgical cut (incision) dry with a towel. Do not rub it.  For men: You may have been given an athletic supporter (scrotal support) before you left the hospital. It holds your scrotum and testicles closer to your body so there is no strain on your wound. Wear the supporter until your doctor tells you that you do not need it anymore. GET HELP RIGHT AWAY IF:  You have watery poop, or cannot poop for more than 3 days.  You feel sick to your stomach or throw up (vomit) more than 2 or 3 times.  You have temperature by mouth above 102 F (38.9 C).  You see redness or puffiness (swelling) around your wound.  You see yellowish white fluid (pus) coming from your wound.  You see a bulge or bump in your lower belly (abdomen) or near your groin.  You develop a rash, trouble breathing, or any other symptoms from medicines taken. MAKE SURE YOU:  Understand these instructions.  Will watch your condition.  Will get help right  away if your are not doing well or get worse. Document Released: 08/16/2008 Document Revised: 11/26/2011 Document Reviewed: 08/16/2008 [facility] Patient Information 2014 [employer] [place].

## 2013-12-17 ENCOUNTER — Encounter (HOSPITAL_COMMUNITY)
Admission: RE | Disposition: A | Discharge status: Home / Self Care | Payer: Self-pay | Source: Ambulatory Visit | Attending: General Surgery

## 2013-12-17 ENCOUNTER — Encounter (HOSPITAL_COMMUNITY): Payer: Medicare Other | Admitting: Anesthesiology

## 2013-12-17 ENCOUNTER — Ambulatory Visit (HOSPITAL_COMMUNITY)
Admission: RE | Admit: 2013-12-17 | Discharge: 2013-12-18 | Disposition: A | Discharge status: Home / Self Care | Payer: Medicare Other | Source: Ambulatory Visit | Attending: General Surgery | Admitting: General Surgery

## 2013-12-17 ENCOUNTER — Ambulatory Visit (HOSPITAL_COMMUNITY): Payer: Medicare Other | Admitting: Anesthesiology

## 2013-12-17 ENCOUNTER — Encounter (HOSPITAL_COMMUNITY): Payer: Self-pay | Admitting: *Deleted

## 2013-12-17 DIAGNOSIS — I714 Abdominal aortic aneurysm, without rupture, unspecified: Secondary | ICD-10-CM | POA: Insufficient documentation

## 2013-12-17 DIAGNOSIS — R011 Cardiac murmur, unspecified: Secondary | ICD-10-CM

## 2013-12-17 DIAGNOSIS — Z87891 Personal history of nicotine dependence: Secondary | ICD-10-CM | POA: Insufficient documentation

## 2013-12-17 DIAGNOSIS — E119 Type 2 diabetes mellitus without complications: Secondary | ICD-10-CM

## 2013-12-17 DIAGNOSIS — Z01810 Encounter for preprocedural cardiovascular examination: Secondary | ICD-10-CM

## 2013-12-17 DIAGNOSIS — I10 Essential (primary) hypertension: Secondary | ICD-10-CM | POA: Insufficient documentation

## 2013-12-17 DIAGNOSIS — E049 Nontoxic goiter, unspecified: Secondary | ICD-10-CM

## 2013-12-17 DIAGNOSIS — K4090 Unilateral inguinal hernia, without obstruction or gangrene, not specified as recurrent: Principal | ICD-10-CM | POA: Diagnosis present

## 2013-12-17 DIAGNOSIS — I739 Peripheral vascular disease, unspecified: Secondary | ICD-10-CM | POA: Insufficient documentation

## 2013-12-17 HISTORY — PX: INGUINAL HERNIA REPAIR: SHX194

## 2013-12-17 SURGERY — REPAIR, HERNIA, INGUINAL, ADULT
Anesthesia: Monitor Anesthesia Care | Site: Abdomen | Laterality: Right

## 2013-12-17 MED ORDER — FENTANYL CITRATE 0.05 MG/ML IJ SOLN
25.0000 ug | INTRAMUSCULAR | Status: AC
  Administered 2013-12-17 (×2): 25 ug via INTRAVENOUS

## 2013-12-17 MED ORDER — LACTATED RINGERS IV SOLN
INTRAVENOUS | Status: DC
  Administered 2013-12-17: 09:00:00 via INTRAVENOUS

## 2013-12-17 MED ORDER — FENTANYL CITRATE 0.05 MG/ML IJ SOLN
INTRAMUSCULAR | Status: DC | PRN
  Administered 2013-12-17: 12.5 ug via INTRAVENOUS

## 2013-12-17 MED ORDER — ONDANSETRON HCL 4 MG/2ML IJ SOLN: 4.0000 mg | Freq: Four times a day (QID) | INTRAMUSCULAR | Status: DC | PRN

## 2013-12-17 MED ORDER — LISINOPRIL 5 MG PO TABS
5.0000 mg | ORAL_TABLET | Freq: Every day | ORAL | Status: DC
  Administered 2013-12-18: 5 mg via ORAL
  Filled 2013-12-17 (×2): qty 1

## 2013-12-17 MED ORDER — BUPIVACAINE IN DEXTROSE 0.75-8.25 % IT SOLN
INTRATHECAL | Status: DC | PRN
  Administered 2013-12-17: 11.25 mg via INTRATHECAL

## 2013-12-17 MED ORDER — BACITRACIN ZINC 500 UNIT/GM EX OINT
TOPICAL_OINTMENT | CUTANEOUS | Status: AC
  Filled 2013-12-17: qty 1.8

## 2013-12-17 MED ORDER — POTASSIUM CHLORIDE IN NACL 20-0.9 MEQ/L-% IV SOLN
INTRAVENOUS | Status: DC
  Administered 2013-12-17: 16:00:00 via INTRAVENOUS

## 2013-12-17 MED ORDER — DOCUSATE SODIUM 100 MG PO CAPS
100.0000 mg | ORAL_CAPSULE | Freq: Every day | ORAL | Status: DC
  Administered 2013-12-17 – 2013-12-18 (×2): 100 mg via ORAL
  Filled 2013-12-17 (×2): qty 1

## 2013-12-17 MED ORDER — MORPHINE SULFATE 2 MG/ML IJ SOLN
1.0000 mg | INTRAMUSCULAR | Status: DC | PRN
  Administered 2013-12-17 – 2013-12-18 (×2): 1 mg via INTRAVENOUS
  Filled 2013-12-17 (×2): qty 1

## 2013-12-17 MED ORDER — SODIUM CHLORIDE 0.9 % IR SOLN
Status: DC | PRN
  Administered 2013-12-17: 1000 mL

## 2013-12-17 MED ORDER — CEFAZOLIN SODIUM 1-5 GM-% IV SOLN
1.0000 g | Freq: Once | INTRAVENOUS | Status: AC
  Administered 2013-12-17: 1 g via INTRAVENOUS

## 2013-12-17 MED ORDER — ONDANSETRON HCL 4 MG/2ML IJ SOLN: 4.0000 mg | Freq: Once | INTRAMUSCULAR | Status: DC | PRN

## 2013-12-17 MED ORDER — LEVOTHYROXINE SODIUM 25 MCG PO TABS
25.0000 ug | ORAL_TABLET | Freq: Every day | ORAL | Status: DC
  Administered 2013-12-18: 25 ug via ORAL
  Filled 2013-12-17: qty 1

## 2013-12-17 MED ORDER — FENTANYL CITRATE 0.05 MG/ML IJ SOLN
INTRAMUSCULAR | Status: AC
  Filled 2013-12-17: qty 2

## 2013-12-17 MED ORDER — BUPIVACAINE HCL (PF) 0.5 % IJ SOLN
INTRAMUSCULAR | Status: AC
  Filled 2013-12-17: qty 30

## 2013-12-17 MED ORDER — BUPIVACAINE IN DEXTROSE 0.75-8.25 % IT SOLN
INTRATHECAL | Status: AC
  Filled 2013-12-17: qty 2

## 2013-12-17 MED ORDER — ONDANSETRON HCL 4 MG PO TABS: 4.0000 mg | ORAL_TABLET | Freq: Four times a day (QID) | ORAL | Status: DC | PRN

## 2013-12-17 MED ORDER — BUPIVACAINE HCL (PF) 0.5 % IJ SOLN
INTRAMUSCULAR | Status: DC | PRN
  Administered 2013-12-17: 15 mL

## 2013-12-17 MED ORDER — FENTANYL CITRATE 0.05 MG/ML IJ SOLN
INTRAMUSCULAR | Status: DC | PRN
  Administered 2013-12-17: 12.5 ug via INTRATHECAL

## 2013-12-17 MED ORDER — PROPOFOL INFUSION 10 MG/ML OPTIME
INTRAVENOUS | Status: DC | PRN
  Administered 2013-12-17: 100 ug/kg/min via INTRAVENOUS

## 2013-12-17 MED ORDER — FENTANYL CITRATE 0.05 MG/ML IJ SOLN: 25.0000 ug | INTRAMUSCULAR | Status: DC | PRN

## 2013-12-17 MED ORDER — MIDAZOLAM HCL 2 MG/2ML IJ SOLN
1.0000 mg | INTRAMUSCULAR | Status: DC | PRN
  Administered 2013-12-17: 2 mg via INTRAVENOUS

## 2013-12-17 MED ORDER — MIDAZOLAM HCL 2 MG/2ML IJ SOLN
INTRAMUSCULAR | Status: AC
  Filled 2013-12-17: qty 2

## 2013-12-17 MED ORDER — STERILE WATER FOR IRRIGATION IR SOLN
Status: DC | PRN
  Administered 2013-12-17 (×2): 1000 mL

## 2013-12-17 MED ORDER — FUROSEMIDE 20 MG PO TABS
20.0000 mg | ORAL_TABLET | Freq: Every day | ORAL | Status: DC | PRN
  Administered 2013-12-17: 20 mg via ORAL
  Filled 2013-12-17: qty 1

## 2013-12-17 MED ORDER — CEFAZOLIN SODIUM 1-5 GM-% IV SOLN
INTRAVENOUS | Status: AC
  Filled 2013-12-17: qty 50

## 2013-12-17 SURGICAL SUPPLY — 42 items
ATTRACTOMAT 16X20 MAGNETIC DRP (DRAPES) ×3 IMPLANT
BAG HAMPER (MISCELLANEOUS) ×3 IMPLANT
CLOTH BEACON ORANGE TIMEOUT ST (SAFETY) ×3 IMPLANT
COVER LIGHT HANDLE STERIS (MISCELLANEOUS) ×6 IMPLANT
DECANTER SPIKE VIAL GLASS SM (MISCELLANEOUS) ×3 IMPLANT
DRAIN PENROSE 12X.25 LTX STRL (MISCELLANEOUS) ×3 IMPLANT
DRSG MEPILEX BORDER 4X8 (GAUZE/BANDAGES/DRESSINGS) IMPLANT
ELECT REM PT RETURN 9FT ADLT (ELECTROSURGICAL) ×3
ELECTRODE REM PT RTRN 9FT ADLT (ELECTROSURGICAL) ×1 IMPLANT
FORMALIN 10 PREFIL 120ML (MISCELLANEOUS) IMPLANT
GLOVE BIOGEL M 7.0 STRL (GLOVE) ×6 IMPLANT
GLOVE BIOGEL PI IND STRL 7.0 (GLOVE) ×2 IMPLANT
GLOVE BIOGEL PI INDICATOR 7.0 (GLOVE) ×4
GLOVE EXAM NITRILE MD LF STRL (GLOVE) ×3 IMPLANT
GLOVE SKINSENSE NS SZ7.0 (GLOVE) ×2
GLOVE SKINSENSE STRL SZ7.0 (GLOVE) ×1 IMPLANT
GOWN STRL REUS W/TWL LRG LVL3 (GOWN DISPOSABLE) ×9 IMPLANT
INST SET MINOR GENERAL (KITS) ×3 IMPLANT
KIT ROOM TURNOVER APOR (KITS) ×3 IMPLANT
MANIFOLD NEPTUNE II (INSTRUMENTS) ×3 IMPLANT
NS IRRIG 1000ML POUR BTL (IV SOLUTION) ×3 IMPLANT
PACK MINOR (CUSTOM PROCEDURE TRAY) ×3 IMPLANT
PAD ARMBOARD 7.5X6 YLW CONV (MISCELLANEOUS) ×3 IMPLANT
SET BASIN LINEN APH (SET/KITS/TRAYS/PACK) ×3 IMPLANT
SOL PREP PROV IODINE SCRUB 4OZ (MISCELLANEOUS) ×3 IMPLANT
SPONGE GAUZE 4X4 12PLY (GAUZE/BANDAGES/DRESSINGS) ×3 IMPLANT
SPONGE INTESTINAL PEANUT (DISPOSABLE) ×3 IMPLANT
SPONGE LAP 18X18 X RAY DECT (DISPOSABLE) ×6 IMPLANT
STAPLER VISISTAT 35W (STAPLE) ×3 IMPLANT
SUT NOVA NAB GS-22 2 2-0 T-19 (SUTURE) IMPLANT
SUT NUROLON NAB CT 2 2-0 18IN (SUTURE) ×3 IMPLANT
SUT PROLENE 0 CT 1 CR/8 (SUTURE) IMPLANT
SUT SILK 2 0 (SUTURE) ×2
SUT SILK 2-0 18XBRD TIE 12 (SUTURE) ×1 IMPLANT
SUT VIC AB 3-0 SH 27 (SUTURE) ×2
SUT VIC AB 3-0 SH 27X BRD (SUTURE) ×1 IMPLANT
SUT VICRYL AB 3 0 TIES (SUTURE) ×3 IMPLANT
SYR BULB IRRIGATION 50ML (SYRINGE) ×3 IMPLANT
SYR CONTROL 10ML LL (SYRINGE) ×3 IMPLANT
TAPE CLOTH SURG 4X10 WHT LF (GAUZE/BANDAGES/DRESSINGS) ×3 IMPLANT
TRAY FOLEY CATH 16FR SILVER (SET/KITS/TRAYS/PACK) ×3 IMPLANT
WATER STERILE IRR 1000ML POUR (IV SOLUTION) ×6 IMPLANT

## 2013-12-17 NOTE — Anesthesia Postprocedure Evaluation (Signed)
Anesthesia Post Note  Patient: Dan Perez  Procedure(s) Performed: Procedure(s) (LRB): HERNIA REPAIR INGUINAL ADULT (Right)  Anesthesia type: Spinal  Patient location: PACU  Post pain: Pain level controlled  Post assessment: Post-op Vital signs reviewed, Patient's Cardiovascular Status Stable, Respiratory Function Stable, Patent Airway, No signs of Nausea or vomiting and Pain level controlled  Last Vitals:  Filed Vitals:   12/17/13 1132  BP: 122/61  Pulse: 61  Temp: 36.6 C  Resp: 15    Post vital signs: Reviewed and stable  Level of consciousness: awake and alert   Complications: No apparent anesthesia complications

## 2013-12-17 NOTE — Anesthesia Preprocedure Evaluation (Signed)
Anesthesia Evaluation  Patient identified by MRN, date of birth, ID band Patient awake    Reviewed: Allergy & Precautions, H&P , NPO status , Patient's Chart, lab work & pertinent test results  Airway Mallampati: I TM Distance: >3 FB     Dental  (+) Edentulous Upper   Pulmonary former smoker,  breath sounds clear to auscultation        Cardiovascular hypertension, Pt. on medications + Peripheral Vascular Disease (AAA - staable) Rhythm:Regular Rate:Normal     Neuro/Psych PSYCHIATRIC DISORDERS (hx paranoid and delusion behavior noted in old chart.)    GI/Hepatic   Endo/Other  neg diabetes  Renal/GU      Musculoskeletal   Abdominal   Peds  Hematology   Anesthesia Other Findings   Reproductive/Obstetrics                           Anesthesia Physical Anesthesia Plan  ASA: II  Anesthesia Plan: Spinal and MAC   Post-op Pain Management:    Induction: Intravenous  Airway Management Planned: Nasal Cannula  Additional Equipment:   Intra-op Plan:   Post-operative Plan:   Informed Consent: I have reviewed the patients History and Physical, chart, labs and discussed the procedure including the risks, benefits and alternatives for the proposed anesthesia with the patient or authorized representative who has indicated his/her understanding and acceptance.     Plan Discussed with:   Anesthesia Plan Comments:         Anesthesia Quick Evaluation

## 2013-12-17 NOTE — Progress Notes (Signed)
[age] yr. Old W. Male for repair of large Left inguinal herni that has intermittent incarceration.  Procedure and risks explained and informed consent obtained.  We also discussed the need for a repair requiring mesh.    This was discussed with daughter and the  pt who is agreeable despite is fantasies Labs reviewed and site marked.  No clinical change in H&P, dict.# [id number].  Filed Vitals:   12/17/13 0915  BP: 167/82  Resp: 20  temp 97.8, HR 77/min, O2 sat 99% RA.

## 2013-12-17 NOTE — Anesthesia Procedure Notes (Signed)
Procedure Name: MAC Date/Time: 12/17/2013 10:00 AM Performed by: Franco NonesYATES, Deyton Ellenbecker S Pre-anesthesia Checklist: Patient identified, Emergency Drugs available, Suction available, Timeout performed and Patient being monitored Patient Re-evaluated:Patient Re-evaluated prior to inductionOxygen Delivery Method: Nasal Cannula    Procedure Name: MAC Date/Time: 12/17/2013 10:19 AM Performed by: Franco NonesYATES, Jenette Rayson S Pre-anesthesia Checklist: Patient identified, Emergency Drugs available, Suction available, Timeout performed and Patient being monitored Patient Re-evaluated:Patient Re-evaluated prior to inductionOxygen Delivery Method: Non-rebreather mask    Spinal  Patient location during procedure: OR Start time: 12/17/2013 10:10 AM End time: 12/17/2013 10:13 AM Staffing CRNA/Resident: Minerva AreolaYATES, Aulton Routt S Preanesthetic Checklist Completed: patient identified, site marked, surgical consent, pre-op evaluation, timeout performed, IV checked, risks and benefits discussed and monitors and equipment checked Spinal Block Patient position: right lateral decubitus Prep: Betadine Patient monitoring: heart rate, cardiac monitor, continuous pulse ox and blood pressure Approach: right paramedian Location: L3-4 Injection technique: single-shot Needle Needle type: Spinocan  Needle gauge: 22 G Needle length: 9 cm Assessment Sensory level: T8 Additional Notes Betadine prep x 3 1% lidocaine skin wheal 1 cc Clear CSF pre and post injection   ATTEMPTS:1 TRAY ID: [id number] TRAY EXPIRATION DATE: 2016-06

## 2013-12-17 NOTE — Transfer of Care (Signed)
Immediate Anesthesia Transfer of Care Note  Patient: Dan Perez  Procedure(s) Performed: Procedure(s): HERNIA REPAIR INGUINAL ADULT (Right)  Patient Location: PACU  Anesthesia Type:Spinal  Level of Consciousness: awake and alert   Airway & Oxygen Therapy: Patient Spontanous Breathing and Patient connected to nasal cannula oxygen  Post-op Assessment: Report given to PACU RN and Post -op Vital signs reviewed and stable T 10  Post vital signs: Reviewed and stable  Complications: No apparent anesthesia complications

## 2013-12-17 NOTE — Brief Op Note (Signed)
12/17/2013  11:32 AM  PATIENT:  Dan Perez  80 y.o. male  PRE-OPERATIVE DIAGNOSIS:  right inguinal hernia  POST-OPERATIVE DIAGNOSIS:  right inguinal hernia  PROCEDURE:  Procedure(s): HERNIA REPAIR INGUINAL ADULT (Right)  SURGEON:  Surgeon(s) and Role:    * Marlane HatcherWilliam S Donnita Farina, MD - Primary  PHYSICIAN ASSISTANT:   ASSISTANTS: none   ANESTHESIA:   spinal  EBL:  Total I/O In: 800 [I.V.:800] Out: 410 [Urine:400; Blood:10]  BLOOD ADMINISTERED:none  DRAINS: none   LOCAL MEDICATIONS USED:  MARCAINE 0.5%  ~ 10cc  SPECIMEN:  Source of Specimen:  right inguinal hernia sac.  DISPOSITION OF SPECIMEN:  PATHOLOGY  COUNTS:  YES  TOURNIQUET:  * No tourniquets in log *  DICTATION: .Other Dictation: Dictation Number OR dict. # [id number]  PLAN OF CARE: Admit for overnight observation  PATIENT DISPOSITION:  PACU - hemodynamically stable.   Delay start of Pharmacological VTE agent (>24hrs) due to surgical blood loss or risk of bleeding: not applicable

## 2013-12-17 NOTE — Progress Notes (Signed)
Post OP Check  Filed Vitals:   12/17/13 1445  BP: 176/72  Pulse: 73  Temp: 97.7 F (36.5 C)  Resp: 16   Wound clean and dry.  Expected tenderness and swelling and ecchymosis.  Will D/C foley in AM.  Pt tolerating PO well.  Needs to ambulate.  No  Head ache and no obvious problems from spinal.

## 2013-12-18 MED ORDER — OXYCODONE-ACETAMINOPHEN 5-325 MG PO TABS: 1.0000 | ORAL_TABLET | ORAL | Status: DC | PRN

## 2013-12-18 MED ORDER — DSS 100 MG PO CAPS: 100.0000 mg | ORAL_CAPSULE | Freq: Every day | ORAL | Status: DC

## 2013-12-18 NOTE — Op Note (Signed)
NAMRocky Perez:  Dan Perez, Dan Perez             ACCOUNT NO.:  [id number]  MEDICAL RECORD NO.:  [id number]  LOCATION:  A338                          FACILITY:  [facility]  PHYSICIAN:  Barbaraann BarthelWilliam Storie Heffern, M.D. DATE OF BIRTH:  1932-07-06  DATE OF PROCEDURE:  12/17/2013 DATE OF DISCHARGE:                              OPERATIVE REPORT   DIAGNOSIS:  Right inguinal hernia.  PROCEDURE:  Right inguinal herniorrhaphy (no mesh).  WOUND CLASSIFICATION:  Clean.  NOTE:  This is a 80 year old white male who was referred by Dr. Juanetta GoslingHawkins for a repair of a right inguinal hernia.  He had a rather large right inguinal hernia that was able to be reduced, however, this was quite large and causing him increased discomfort.  This has been present according to him for at least 8 months.  After medical and cardiac clearance, he was taken to surgery via the outpatient department.  The patient had some fantasies and a little bit of confusion, but  after psych evaluation, they placed him on no medicines and he seemed very cooperative and noncombatant.  We discussed with him and his daughter the procedure, discussing complications not limited to, but including bleeding, infection, and recurrence, and the possible use of mesh. Informed consent was obtained.  GROSS OPERATIVE FINDINGS:  The patient had very large right inguinal hernia sac with an indirect defect and with a direct defect less pronounced.  This was repaired without the use of mesh.  TECHNIQUE:  The patient was placed in supine position.  After the adequate administration of spinal anesthesia, a Foley catheter was aseptically inserted and he was prepped with Betadine solution and draped in the usual manner.  An incision was carried out between the anterior-superior iliac spine and the pubic tubercle through skin, subcutaneous tissue, down to the external oblique, which was opened through the external ring.  The cord structures were very fibrosed to the hernia sac.   We were able to dissect this from the cord structures, however, and then under direct vision we ligated the sac and amputated the redundant portion of it and sent as a specimen.  This was suture ligated with 2-0 Bralon.  We then repaired the direct portion of the sac using 2-0 Bralon interrupted sutures suturing transversus abdominis and transversalis fascia to Cooper's ligament and Poupart's ligament.  Prior to cinching these, we performed a relaxing incision and then irrigated the area.  I used 0.5% Sensorcaine to help with postoperative comfort, then closed the external oblique over the cord structures, irrigated the subcutaneous space, and closed the skin with a stapling device.  Prior to closure, all sponge, needle, and instrument counts were found to be correct.  Estimated blood loss was minimal.  He received approximately 800 mL of crystalloids intraoperatively.  There were no complications.  No drains were placed.     Barbaraann BarthelWilliam Vedh Ptacek, M.D.     WB/MEDQ  D:  12/17/2013  T:  12/17/2013  Job:  [id number]  cc:   Dr. Juanetta GoslingHawkins

## 2013-12-18 NOTE — Progress Notes (Signed)
Night shift nurse charted SCD's as being on. The SCD's were not placed until I obtained pump and placed them on patient at 1025 this am.

## 2013-12-18 NOTE — Progress Notes (Signed)
POD 1  . Filed Vitals:   12/18/13 1118  BP: 156/81  Pulse: 79  Temp: 97.4 F (36.3 C)  Resp: 20  Awake and alert.  Pt has demonstrated no flights of fantasy.  Wound is clean and dry. Dressing changed.  He has expected swelling and echymosis but very little pain.  He has voided without his foley and I will make sure he ambulates and voids well before discharging.  Discharge and follow up arranged with daughter.  Discharge dictated, note # [id number].

## 2013-12-19 NOTE — Progress Notes (Signed)
Patient received discharge instructions along with follow up appointments and prescriptions. Patient verbalized understanding of all instructions. Patient was escorted by staff via wheelchair to vehicle. Patient discharged to home in stable condition. 

## 2013-12-19 NOTE — Discharge Summary (Signed)
[name], Dan Perez             ACCOUNT NO.:  [id number]  MEDICAL RECORD NO.:  [id number]  LOCATION:  A338                          FACILITY:  [facility]  PHYSICIAN:  Barbaraann BarthelWilliam Keiko Myricks, M.D. DATE OF BIRTH:  [birth year]  DATE OF ADMISSION:  12/17/2013 DATE OF DISCHARGE:  04/03/2015LH                              DISCHARGE SUMMARY   PROCEDURE:  Right inguinal herniorrhaphy (without the use of mesh) on December 17, 2013.  NOTE:  This is a [age] year old white male who noted a rather large right inguinal hernia for that had been present according to him at least for the last 8 months.  This had increased in size and caused him increasing discomfort.  He was referred to my office by Dr. Juanetta GoslingHawkins.  He had a cardiac and medical clearance prior to his surgery as an outpatient, and we planned to admit him via the outpatient department.  We also noted that he had some flights of fancy thinking that there were aliens around him.  However, he showed no signs of combative or disruptive behavior and apparently a psych consultation did not place him on any kind of psychotropic medicines.  Secondary diagnoses also includes hypothyroidism and he has history of hypertension as well.  Hospital course was uneventful.  He was admitted via the outpatient department and a right inguinal herniorrhaphy was accomplished without any problems.  He was discharged on the following day, at which time he was able to void.  He had, had a spinal anesthesia, and had no problems with any spinal anesthesia problems.  He will be discharged when he is ambulating well, and I am sure that he is able to continually able to void without any problems.  We discussed followup arrangements with his daughter and she is told to contact us or go to the emergency room should there be any acute changes.  Discharge instructions were given for him to follow up with us perioperatively and return to Dr. Juanetta GoslingHawkins care medically.  Wound care was  discussed explicitly with the family as this patient has a tendency to avoid taking baths and we discussed the need for cleanliness and appropriate wound care with the daughters and she assured me that this would take place.  At time of discharge, he was tolerating p.o. well.  His abdomen was soft.  He had some expected swelling and ecchymosis in his right scrotum which was expected as wound.  His pain was controlled with the current pain regimen, and he was voiding as I stated well.  We will follow up with him perioperatively.  He had at time of discharge no leg pain, no shortness of breath, or chest pain.     Barbaraann BarthelWilliam Jaliah Foody, M.D.     WB/MEDQ  D:  12/18/2013  T:  12/19/2013  Job:  [id number]  cc:   Ramon DredgeEdward L. Juanetta GoslingHawkins, M.D. Fax: [phone]

## 2013-12-22 ENCOUNTER — Encounter (HOSPITAL_COMMUNITY): Payer: Self-pay | Admitting: General Surgery

## 2013-12-25 ENCOUNTER — Other Ambulatory Visit (HOSPITAL_COMMUNITY): Payer: Self-pay | Admitting: General Surgery

## 2013-12-25 ENCOUNTER — Ambulatory Visit (HOSPITAL_COMMUNITY)
Admission: RE | Admit: 2013-12-25 | Discharge: 2013-12-25 | Disposition: A | Discharge status: Home / Self Care | Payer: Medicare Other | Source: Ambulatory Visit | Attending: General Surgery | Admitting: General Surgery

## 2013-12-25 DIAGNOSIS — N508 Other specified disorders of male genital organs: Secondary | ICD-10-CM | POA: Insufficient documentation

## 2013-12-25 DIAGNOSIS — X58XXXA Exposure to other specified factors, initial encounter: Secondary | ICD-10-CM | POA: Insufficient documentation

## 2013-12-25 DIAGNOSIS — K458 Other specified abdominal hernia without obstruction or gangrene: Secondary | ICD-10-CM

## 2013-12-25 DIAGNOSIS — S301XXA Contusion of abdominal wall, initial encounter: Secondary | ICD-10-CM | POA: Insufficient documentation

## 2014-11-29 ENCOUNTER — Encounter: Payer: Self-pay | Admitting: Family

## 2014-11-30 ENCOUNTER — Ambulatory Visit (INDEPENDENT_AMBULATORY_CARE_PROVIDER_SITE_OTHER): Payer: Medicare Other | Admitting: Family

## 2014-11-30 ENCOUNTER — Encounter: Payer: Self-pay | Admitting: Family

## 2014-11-30 ENCOUNTER — Ambulatory Visit (HOSPITAL_COMMUNITY)
Admission: RE | Admit: 2014-11-30 | Discharge: 2014-11-30 | Disposition: A | Discharge status: Home / Self Care | Payer: Medicare Other | Source: Ambulatory Visit | Attending: Family | Admitting: Family

## 2014-11-30 VITALS — BP 172/96 | HR 69 | Resp 16 | Ht 73.0 in | Wt 172.0 lb

## 2014-11-30 DIAGNOSIS — I714 Abdominal aortic aneurysm, without rupture, unspecified: Secondary | ICD-10-CM

## 2014-11-30 DIAGNOSIS — Z87891 Personal history of nicotine dependence: Secondary | ICD-10-CM

## 2014-11-30 DIAGNOSIS — I872 Venous insufficiency (chronic) (peripheral): Secondary | ICD-10-CM

## 2014-11-30 NOTE — Progress Notes (Signed)
[facility] [place]  Established Abdominal Aortic Aneurysm  History of Present Illness  Dan Perez is a 81 y.o. (07-20-1932) male patient of Dr. Hart Rochester who was initially referred by Dr. Juanetta Gosling for evaluation of the small aortic aneurysm recently discovered in the emergency department. Patient was having abdominal discomfort and an ultrasound exam was performed which revealed an aortic aneurysm measuring 3.9 cm in maximum diameter. Patient's pain resolved within 5 hours and has not reoccurred. He's had no change in his bowel habits. His appetite has been good. He returns today for follow up. Daughter states that his blood pressure is normally 120's/70's. The patient does denies back or abdominal pain.  The patient is a former smoker. The patient denies claudication in legs with walking , denies non healing wounds. The patient denies history of stroke or TIA symptoms.  Pt Diabetic: No Pt smoker: former smoker, quit in 1984  Past Medical History  Diagnosis Date  . History of goiter   . Essential hypertension, benign   . Paranoia   . Delusional disorder     chronic  . Chronic abdominal pain   . AAA (abdominal aortic aneurysm) without rupture     [place] (11/03/2013): 3.9 x 3.3 cm  . Right inguinal hernia     to see Dr. Malvin Johns for repair   Past Surgical History  Procedure Laterality Date  . Thyroidectomy  03/02/2011    Left lobectomy and isthmusectomy  . Cataract extraction, bilateral    . Inguinal hernia repair Right 12/17/2013    Procedure: HERNIA REPAIR INGUINAL ADULT;  Surgeon: Marlane Hatcher, MD;  Location: [facility];  Service: General;  Laterality: Right;   Social History History   Social History  . Marital Status: Single    Spouse Name: N/A  . Number of Children: N/A  . Years of Education: N/A   Occupational History  . Not on file.   Social History Main Topics  . Smoking status: Former Smoker    Quit date: 08/12/1983  . Smokeless tobacco:  Never Used  . Alcohol Use: No  . Drug Use: No  . Sexual Activity: Not Currently   Other Topics Concern  . Not on file   Social History Narrative  . No narrative on file   Family History Family History  Problem Relation Age of Onset  . Heart attack Father   . Dementia Mother   . Stroke Mother     Current Outpatient Prescriptions on File Prior to Visit  Medication Sig Dispense Refill  . ciprofloxacin (CIPRO) 500 MG tablet Take 1 tablet (500 mg total) by mouth 2 (two) times daily. One po bid x 7 days 14 tablet 0  . docusate sodium 100 MG CAPS Take 100 mg by mouth daily. 10 capsule 0  . furosemide (LASIX) 20 MG tablet Take 20 mg by mouth daily as needed for fluid.     Marland Kitchen levothyroxine (LEVOTHROID) 25 MCG tablet Take 25 mcg by mouth daily.      Marland Kitchen lisinopril (PRINIVIL,ZESTRIL) 5 MG tablet Take 5 mg by mouth daily.      . Multiple Vitamins-Minerals (MULTIVITAMINS THER. W/MINERALS) TABS tablet Take 1 tablet by mouth daily.    Marland Kitchen oxyCODONE-acetaminophen (PERCOCET) 5-325 MG per tablet Take 1 tablet by mouth every 4 (four) hours as needed for severe pain. 30 tablet 0   No current facility-administered medications on file prior to visit.   No Known Allergies  ROS: See HPI for pertinent positives and negatives.  Physical Examination  Filed Vitals:   11/30/14 0920  BP: 172/96  Pulse: 69  Resp: 16  Height:  (1.854 m)  Weight: 172 lb (78.019 kg)  SpO2: 100%   Body mass index is 22.7 kg/(m^2).  General: A&O x 3, WD.  Pulmonary: Sym exp, good air movt, CTAB, no rales, rhonchi, or wheezing.  Cardiac: RRR, Nl S1, S2, + low grade murmur.   Carotid Bruits Right Left   Negative Negative   Aorta is not palpable Radial pulses are 2+ palpable and =                          VASCULAR EXAM:                                                                                                         LE Pulses Right Left       FEMORAL   palpable   palpable        POPLITEAL  not  palpable   not palpable       POSTERIOR TIBIAL  not palpable   not palpable        DORSALIS PEDIS      ANTERIOR TIBIAL faintly palpable  faintly palpable      Gastrointestinal: soft, NTND, -G/R, - HSM, - masses palpated, - CVAT B.  Musculoskeletal: M/S 5/5 throughout, Extremities without ischemic changes. Venous stasis changes in lower legs, 1+ pitting edema in both lower legs.  Neurologic: CN 2-12 intact, Pain and light touch intact in extremities are intact except, motor exam as listed above.  Non-Invasive Vascular Imaging  AAA Duplex (11/30/2014) ABDOMINAL AORTA DUPLEX EVALUATION    INDICATION: Evaluation of known abdominal aortic aneurysm.    PREVIOUS INTERVENTION(S):     DUPLEX EXAM:     LOCATION DIAMETER AP (cm) DIAMETER TRANSVERSE (cm) VELOCITIES (cm/sec)  Aorta Proximal 2.1 2.1 57  Aorta Mid 3.6 3.9 98  Aorta Distal 2.0 1.9 135  Right Common Iliac Artery 1.3 1.4 131  Left Common Iliac Artery 1.3 1.29 70    Previous max aortic diameter:  3.9 cm - [facility] Date: 11/03/2013     ADDITIONAL FINDINGS:     IMPRESSION: 1. Abdominal aortic aneurysm measuring 3.6 cm AP x 3.9 cm transverse.       Compared to the previous exam:  There are no previous examinations performed at our office for comparison.     Medical Decision Making  The patient is a 81 y.o. male who presents with asymptomatic AAA with no increase in size.  New problem: Chronic venous insufficiency: instructed pt and daughter in measuring lower legs for 20-30 mm Hg graduated knee high compression hose, wear during the day.   Based on this patient's exam and diagnostic studies, the patient will follow up in 1 year  with the following studies: AAA Duplex.  Consideration for repair of AAA would be made when the size approaches 4.8 or 5.0 cm, growth > 1 cm/yr, and symptomatic status.  I emphasized the importance of  maximal medical management including strict control of blood pressure, blood glucose,  and lipid levels, antiplatelet agents, obtaining regular exercise, and continued cessation of smoking.   The patient was given information about AAA including signs, symptoms, treatment, and how to minimize the risk of enlargement and rupture of aneurysms.    The patient was advised to call 911 should the patient experience sudden onset abdominal or back pain.   Thank you for allowing us to participate in this patient's care.  Charisse MarchSuzanne Nickel, RN, MSN, FNP-C [facility] [place] Office: [phone]  Clinic Physician: Hart RochesterLawson  11/30/2014, 9:11 AM

## 2014-11-30 NOTE — Patient Instructions (Addendum)
Abdominal Aortic Aneurysm An aneurysm is a weakened or damaged part of an artery wall that bulges from the normal force of blood pumping through the body. An abdominal aortic aneurysm is an aneurysm that occurs in the lower part of the aorta, the main artery of the body.  The major concern with an abdominal aortic aneurysm is that it can enlarge and burst (rupture) or blood can flow between the layers of the wall of the aorta through a tear (aorticdissection). Both of these conditions can cause bleeding inside the body and can be life threatening unless diagnosed and treated promptly. CAUSES  The exact cause of an abdominal aortic aneurysm is unknown. Some contributing factors are:   A hardening of the arteries caused by the buildup of fat and other substances in the lining of a blood vessel (arteriosclerosis).  Inflammation of the walls of an artery (arteritis).   Connective tissue diseases, such as Marfan syndrome.   Abdominal trauma.   An infection, such as syphilis or staphylococcus, in the wall of the aorta (infectious aortitis) caused by bacteria. RISK FACTORS  Risk factors that contribute to an abdominal aortic aneurysm may include:  Age older than 60 years.   High blood pressure (hypertension).  Male gender.  Ethnicity (white race).  Obesity.  Family history of aneurysm (first degree relatives only).  Tobacco use. PREVENTION  The following healthy lifestyle habits may help decrease your risk of abdominal aortic aneurysm:  Quitting smoking. Smoking can raise your blood pressure and cause arteriosclerosis.  Limiting or avoiding alcohol.  Keeping your blood pressure, blood sugar level, and cholesterol levels within normal limits.  Decreasing your salt intake. In somepeople, too much salt can raise blood pressure and increase your risk of abdominal aortic aneurysm.  Eating a diet low in saturated fats and cholesterol.  Increasing your fiber intake by including  whole grains, vegetables, and fruits in your diet. Eating these foods may help lower blood pressure.  Maintaining a healthy weight.  Staying physically active and exercising regularly. SYMPTOMS  The symptoms of abdominal aortic aneurysm may vary depending on the size and rate of growth of the aneurysm.Most grow slowly and do not have any symptoms. When symptoms do occur, they may include:  Pain (abdomen, side, lower back, or groin). The pain may vary in intensity. A sudden onset of severe pain may indicate that the aneurysm has ruptured.  Feeling full after eating only small amounts of food.  Nausea or vomiting or both.  Feeling a pulsating lump in the abdomen.  Feeling faint or passing out. DIAGNOSIS  Since most unruptured abdominal aortic aneurysms have no symptoms, they are often discovered during diagnostic exams for other conditions. An aneurysm may be found during the following procedures:  Ultrasonography (A one-time screening for abdominal aortic aneurysm by ultrasonography is also recommended for all men aged 65-75 years who have ever smoked).  X-ray exams.  A computed tomography (CT).  Magnetic resonance imaging (MRI).  Angiography or arteriography. TREATMENT  Treatment of an abdominal aortic aneurysm depends on the size of your aneurysm, your age, and risk factors for rupture. Medication to control blood pressure and pain may be used to manage aneurysms smaller than 6 cm. Regular monitoring for enlargement may be recommended by your caregiver if:  The aneurysm is 3-4 cm in size (an annual ultrasonography may be recommended).  The aneurysm is 4-4.5 cm in size (an ultrasonography every 6 months may be recommended).  The aneurysm is larger than 4.5 cm in   size (your caregiver may ask that you be examined by a vascular surgeon). If your aneurysm is larger than 6 cm, surgical repair may be recommended. There are two main methods for repair of an aneurysm:   Endovascular  repair (a minimally invasive surgery). This is done most often.  Open repair. This method is used if an endovascular repair is not possible. Document Released: 06/13/2005 Document Revised: 12/29/2012 Document Reviewed: 10/03/2012 ExitCare Patient Information 2015 [employer]. This information is not intended to replace advice given to you by your health care provider. Make sure you discuss any questions you have with your health care provider.   Venous Stasis or Chronic Venous Insufficiency Chronic venous insufficiency, also called venous stasis, is a condition that affects the veins in the legs. The condition prevents blood from being pumped through these veins effectively. Blood may no longer be pumped effectively from the legs back to the heart. This condition can range from mild to severe. With proper treatment, you should be able to continue with an active life. CAUSES  Chronic venous insufficiency occurs when the vein walls become stretched, weakened, or damaged or when valves within the vein are damaged. Some common causes of this include:  High blood pressure inside the veins (venous hypertension).  Increased blood pressure in the leg veins from long periods of sitting or standing.  A blood clot that blocks blood flow in a vein (deep vein thrombosis).  Inflammation of a superficial vein (phlebitis) that causes a blood clot to form. RISK FACTORS Various things can make you more likely to develop chronic venous insufficiency, including:  Family history of this condition.  Obesity.  Pregnancy.  Sedentary lifestyle.  Smoking.  Jobs requiring long periods of standing or sitting in one place.  Being a certain age. Women in their 40s and 50s and men in their 70s are more likely to develop this condition. SIGNS AND SYMPTOMS  Symptoms may include:   Varicose veins.  Skin breakdown or ulcers.  Reddened or discolored skin on the leg.  Brown, smooth, tight, and painful  skin just above the ankle, usually on the inside surface (lipodermatosclerosis).  Swelling. DIAGNOSIS  To diagnose this condition, your health care provider will take a medical history and do a physical exam. The following tests may be ordered to confirm the diagnosis:  Duplex ultrasound--A procedure that produces a picture of a blood vessel and nearby organs and also provides information on blood flow through the blood vessel.  Plethysmography--A procedure that tests blood flow.  A venogram, or venography--A procedure used to look at the veins using X-ray and dye. TREATMENT The goals of treatment are to help you return to an active life and to minimize pain or disability. Treatment will depend on the severity of the condition. Medical procedures may be needed for severe cases. Treatment options may include:   Use of compression stockings. These can help with symptoms and lower the chances of the problem getting worse, but they do not cure the problem.  Sclerotherapy--A procedure involving an injection of a material that "dissolves" the damaged veins. Other veins in the network of blood vessels take over the function of the damaged veins.  Surgery to remove the vein or cut off blood flow through the vein (vein stripping or laser ablation surgery).  Surgery to repair a valve. HOME CARE INSTRUCTIONS   Wear compression stockings as directed by your health care provider.  Only take over-the-counter or prescription medicines for pain, discomfort, or fever as   as directed by your health care provider.  Follow up with your health care provider as directed. SEEK MEDICAL CARE IF:   You have redness, swelling, or increasing pain in the affected area.  You see a red streak or line that extends up or down from the affected area.  You have a breakdown or loss of skin in the affected area, even if the breakdown is small.  You have an injury to the affected area. SEEK IMMEDIATE MEDICAL CARE IF:    You have an injury and open wound in the affected area.  Your pain is severe and does not improve with medicine.  You have sudden numbness or weakness in the foot or ankle below the affected area, or you have trouble moving your foot or ankle.  You have a fever or persistent symptoms for more than 2-3 days.  You have a fever and your symptoms suddenly get worse. MAKE SURE YOU:   Understand these instructions.  Will watch your condition.  Will get help right away if you are not doing well or get worse. Document Released: 01/07/2007 Document Revised: 06/24/2013 Document Reviewed: 05/11/2013 [facility] Patient Information 2015 [place] [place]. This information is not intended to replace advice given to you by your health care provider. Make sure you discuss any questions you have with your health care provider.

## 2015-11-28 ENCOUNTER — Encounter: Payer: Self-pay | Admitting: Family

## 2015-12-02 ENCOUNTER — Inpatient Hospital Stay (HOSPITAL_COMMUNITY)
Admission: RE | Admit: 2015-12-02 | Discharge: 2015-12-02 | Disposition: A | Discharge status: Home / Self Care | Payer: Medicare Other | Source: Ambulatory Visit | Attending: Family | Admitting: Family

## 2015-12-02 ENCOUNTER — Ambulatory Visit: Payer: Medicare Other | Admitting: Family

## 2015-12-02 DIAGNOSIS — Z87891 Personal history of nicotine dependence: Secondary | ICD-10-CM

## 2015-12-02 DIAGNOSIS — I714 Abdominal aortic aneurysm, without rupture, unspecified: Secondary | ICD-10-CM

## 2016-05-01 DIAGNOSIS — I714 Abdominal aortic aneurysm, without rupture: Secondary | ICD-10-CM | POA: Diagnosis not present

## 2016-05-01 DIAGNOSIS — E0590 Thyrotoxicosis, unspecified without thyrotoxic crisis or storm: Secondary | ICD-10-CM | POA: Diagnosis not present

## 2016-05-01 DIAGNOSIS — F22 Delusional disorders: Secondary | ICD-10-CM | POA: Diagnosis not present

## 2016-05-01 DIAGNOSIS — I10 Essential (primary) hypertension: Secondary | ICD-10-CM | POA: Diagnosis not present

## 2016-07-04 DIAGNOSIS — Z23 Encounter for immunization: Secondary | ICD-10-CM | POA: Diagnosis not present

## 2016-07-04 DIAGNOSIS — Z125 Encounter for screening for malignant neoplasm of prostate: Secondary | ICD-10-CM | POA: Diagnosis not present

## 2016-07-04 DIAGNOSIS — I10 Essential (primary) hypertension: Secondary | ICD-10-CM | POA: Diagnosis not present

## 2016-07-04 DIAGNOSIS — R634 Abnormal weight loss: Secondary | ICD-10-CM | POA: Diagnosis not present

## 2016-07-04 DIAGNOSIS — F22 Delusional disorders: Secondary | ICD-10-CM | POA: Diagnosis not present

## 2016-07-04 DIAGNOSIS — I714 Abdominal aortic aneurysm, without rupture: Secondary | ICD-10-CM | POA: Diagnosis not present

## 2016-10-11 DIAGNOSIS — I10 Essential (primary) hypertension: Secondary | ICD-10-CM | POA: Diagnosis not present

## 2016-10-11 DIAGNOSIS — E538 Deficiency of other specified B group vitamins: Secondary | ICD-10-CM | POA: Diagnosis not present

## 2016-10-11 DIAGNOSIS — E1165 Type 2 diabetes mellitus with hyperglycemia: Secondary | ICD-10-CM | POA: Diagnosis not present

## 2016-10-11 DIAGNOSIS — E0590 Thyrotoxicosis, unspecified without thyrotoxic crisis or storm: Secondary | ICD-10-CM | POA: Diagnosis not present

## 2016-10-11 DIAGNOSIS — F22 Delusional disorders: Secondary | ICD-10-CM | POA: Diagnosis not present

## 2016-10-11 DIAGNOSIS — I714 Abdominal aortic aneurysm, without rupture: Secondary | ICD-10-CM | POA: Diagnosis not present

## 2016-11-01 DIAGNOSIS — E538 Deficiency of other specified B group vitamins: Secondary | ICD-10-CM | POA: Diagnosis not present

## 2016-11-01 DIAGNOSIS — F0390 Unspecified dementia without behavioral disturbance: Secondary | ICD-10-CM | POA: Diagnosis not present

## 2016-11-01 DIAGNOSIS — F22 Delusional disorders: Secondary | ICD-10-CM | POA: Diagnosis not present

## 2016-11-01 DIAGNOSIS — I10 Essential (primary) hypertension: Secondary | ICD-10-CM | POA: Diagnosis not present

## 2016-12-03 ENCOUNTER — Encounter (HOSPITAL_COMMUNITY): Payer: Self-pay | Admitting: Emergency Medicine

## 2016-12-03 ENCOUNTER — Emergency Department (HOSPITAL_COMMUNITY)
Admission: EM | Admit: 2016-12-03 | Discharge: 2016-12-03 | Disposition: A | Discharge status: Home / Self Care | Payer: PPO | Attending: Emergency Medicine | Admitting: Emergency Medicine

## 2016-12-03 ENCOUNTER — Emergency Department (HOSPITAL_COMMUNITY): Payer: PPO

## 2016-12-03 DIAGNOSIS — Z79899 Other long term (current) drug therapy: Secondary | ICD-10-CM | POA: Diagnosis not present

## 2016-12-03 DIAGNOSIS — E119 Type 2 diabetes mellitus without complications: Secondary | ICD-10-CM | POA: Diagnosis not present

## 2016-12-03 DIAGNOSIS — I10 Essential (primary) hypertension: Secondary | ICD-10-CM | POA: Insufficient documentation

## 2016-12-03 DIAGNOSIS — R1032 Left lower quadrant pain: Secondary | ICD-10-CM | POA: Diagnosis not present

## 2016-12-03 DIAGNOSIS — K5730 Diverticulosis of large intestine without perforation or abscess without bleeding: Secondary | ICD-10-CM | POA: Diagnosis not present

## 2016-12-03 DIAGNOSIS — K529 Noninfective gastroenteritis and colitis, unspecified: Secondary | ICD-10-CM | POA: Insufficient documentation

## 2016-12-03 DIAGNOSIS — Z87891 Personal history of nicotine dependence: Secondary | ICD-10-CM | POA: Insufficient documentation

## 2016-12-03 LAB — URINALYSIS, ROUTINE W REFLEX MICROSCOPIC
BILIRUBIN URINE: NEGATIVE
Bacteria, UA: NONE SEEN
Glucose, UA: NEGATIVE mg/dL
Ketones, ur: NEGATIVE mg/dL
LEUKOCYTES UA: NEGATIVE
Nitrite: NEGATIVE
Protein, ur: NEGATIVE mg/dL
Specific Gravity, Urine: 1.008 (ref 1.005–1.030)
pH: 7 (ref 5.0–8.0)

## 2016-12-03 LAB — COMPREHENSIVE METABOLIC PANEL
ALBUMIN: 3.8 g/dL (ref 3.5–5.0)
ALK PHOS: 50 U/L (ref 38–126)
ALT: 17 U/L (ref 17–63)
AST: 28 U/L (ref 15–41)
Anion gap: 9 (ref 5–15)
BILIRUBIN TOTAL: 0.4 mg/dL (ref 0.3–1.2)
BUN: 24 mg/dL — ABNORMAL HIGH (ref 6–20)
CALCIUM: 8.7 mg/dL — AB (ref 8.9–10.3)
CO2: 28 mmol/L (ref 22–32)
Chloride: 98 mmol/L — ABNORMAL LOW (ref 101–111)
Creatinine, Ser: 1.19 mg/dL (ref 0.61–1.24)
GFR calc Af Amer: >60 mL/min (ref >60)
GFR calc non Af Amer: 55 mL/min — ABNORMAL LOW (ref >60)
GLUCOSE: 88 mg/dL (ref 65–99)
Potassium: 4.2 mmol/L (ref 3.5–5.1)
Sodium: 135 mmol/L (ref 135–145)
Total Protein: 7.1 g/dL (ref 6.5–8.1)

## 2016-12-03 LAB — LIPASE, BLOOD: Lipase: 46 U/L (ref 11–51)

## 2016-12-03 LAB — CBC
HCT: 34.7 % — ABNORMAL LOW (ref 39.0–52.0)
Hemoglobin: 11.4 g/dL — ABNORMAL LOW (ref 13.0–17.0)
MCH: 29.5 pg (ref 26.0–34.0)
MCHC: 32.9 g/dL (ref 30.0–36.0)
MCV: 89.9 fL (ref 78.0–100.0)
Platelets: 225 10*3/uL (ref 150–400)
RBC: 3.86 MIL/uL — ABNORMAL LOW (ref 4.22–5.81)
RDW: 13.8 % (ref 11.5–15.5)
WBC: 7.6 10*3/uL (ref 4.0–10.5)

## 2016-12-03 MED ORDER — METRONIDAZOLE 500 MG PO TABS: 500.0000 mg | ORAL_TABLET | Freq: Two times a day (BID) | ORAL | 0 refills | Status: DC

## 2016-12-03 MED ORDER — SODIUM CHLORIDE 0.9 % IV BOLUS (SEPSIS)
1000.0000 mL | Freq: Once | INTRAVENOUS | Status: AC
  Administered 2016-12-03: 1000 mL via INTRAVENOUS

## 2016-12-03 MED ORDER — CIPROFLOXACIN HCL 500 MG PO TABS: 500.0000 mg | ORAL_TABLET | Freq: Two times a day (BID) | ORAL | 0 refills | Status: AC

## 2016-12-03 MED ORDER — IOPAMIDOL (ISOVUE-300) INJECTION 61%
100.0000 mL | Freq: Once | INTRAVENOUS | Status: AC | PRN
  Administered 2016-12-03: 100 mL via INTRAVENOUS

## 2016-12-03 NOTE — Discharge Instructions (Signed)
We believe your symptoms are caused by colitis  Most of the time this condition can be cured with outpatient antibiotics.  Please take the full course of prescribed medication(s) and follow up with the doctors recommended above.  Return to the ED if your abdominal pain worsens or fails to improve, you develop bloody vomiting, bloody diarrhea, you are unable to tolerate fluids due to vomiting, fever greater than 101, or other symptoms that concern you.

## 2016-12-03 NOTE — ED Notes (Signed)
Pt alert & oriented x4. Patient given discharge instructions, paperwork & prescription(s). Patient verbalized understanding. Pt left department in wheelchair escorted by staff. Pt left w/ no further questions. 

## 2016-12-03 NOTE — ED Provider Notes (Signed)
Blood pressure (!) 148/78, pulse 97, temperature 98.7 F (37.1 C), temperature source Oral, resp. rate 19, height 5\' 6"  (1.676 m), weight 165 lb (74.8 kg), SpO2 97 %.  Assuming care from Dr. Clayborne DanaMesner.  In short, Laurian BrimLawrence E Banh is a 83 y.o. male with a chief complaint of Abdominal Pain .  Refer to the original H&P for additional details.  The current plan of care is to follow CT abdomen/pelvis and reassess.  10:50 PM Updated patient on CT scan results. Patient is having some left-sided pain with only very mild tenderness on my exam. No acute distress. No rigid abdomen. Given his symptoms and concern for possible early/mild colitis will start the patient on 1 week of antibiotics.   At this time, I do not feel there is any life-threatening condition present. I have reviewed and discussed all results (EKG, imaging, lab, urine as appropriate), exam findings with patient. I have reviewed nursing notes and appropriate previous records.  I feel the patient is safe to be discharged home without further emergent workup. Discussed usual and customary return precautions. Patient and family (if present) verbalize understanding and are comfortable with this plan.  Patient will follow-up with their primary care provider. If they do not have a primary care provider, information for follow-up has been provided to them. All questions have been answered.  Alona BeneJoshua Lashan Macias, MD Emergency Medicine   Maia PlanJoshua G Katara Griner, MD 12/04/16 1146

## 2016-12-03 NOTE — ED Triage Notes (Signed)
Pt reports intermittent LUQ pain "on and off for years." pt denies any n/v/d, fever, gu symptoms. nad noted.

## 2016-12-03 NOTE — ED Provider Notes (Signed)
[facility] Provider Note   CSN: [id number] Arrival date & time: 12/03/16  1726     History   Chief Complaint Chief Complaint  Patient presents with  . Abdominal Pain    HPI Dan Perez is a 83 y.o. male.   Abdominal Pain   This is a new problem. The current episode started more than 1 week ago. The problem occurs daily. The problem has been gradually worsening. The pain is located in the LUQ. The pain is moderate. Pertinent negatives include anorexia, fever, belching, hematochezia, melena and vomiting. Nothing aggravates the symptoms. Nothing relieves the symptoms. Past workup includes ultrasound (found to have AAA).    Past Medical History:  Diagnosis Date  . AAA (abdominal aortic aneurysm) without rupture (HCC)    [place] (11/03/2013): 3.9 x 3.3 cm  . Chronic abdominal pain   . Delusional disorder (HCC)    chronic  . Essential hypertension, benign   . History of goiter   . Paranoia (HCC)   . Right inguinal hernia    to see Dr. Malvin JohnsBradford for repair    Patient Active Problem List   Diagnosis Date Noted  . Inguinal hernia unilateral, non-recurrent 12/17/2013  . AAA (abdominal aortic aneurysm) without rupture (HCC) 11/24/2013  . Preoperative cardiovascular examination 10/23/2013  . Essential hypertension, benign 10/23/2013  . Type 2 diabetes mellitus (HCC) 10/23/2013  . Cardiac murmur 10/23/2013  . Substernal thyroid goiter 03/27/2011    Past Surgical History:  Procedure Laterality Date  . CATARACT EXTRACTION, BILATERAL    . EYE SURGERY Left 2004   Cataract   . EYE SURGERY Right 2004   Cataract   . INGUINAL HERNIA REPAIR Right 12/17/2013   Procedure: HERNIA REPAIR INGUINAL ADULT;  Surgeon: Marlane HatcherWilliam S Bradford, MD;  Location: [facility];  Service: General;  Laterality: Right;  . THYROIDECTOMY  03/02/2011   Left lobectomy and isthmusectomy -        Home Medications    Prior to Admission medications   Medication Sig Start Date End Date Taking? Authorizing  Provider  furosemide (LASIX) 20 MG tablet Take 20 mg by mouth daily as needed for edema.    Yes Historical Provider, MD  levothyroxine (LEVOTHROID) 25 MCG tablet Take 25 mcg by mouth 2 (two) times a week.    Yes Historical Provider, MD  lisinopril (PRINIVIL,ZESTRIL) 5 MG tablet Take 5 mg by mouth daily.     Yes Historical Provider, MD    Family History Family History  Problem Relation Age of Onset  . Heart attack Father   . Dementia Mother   . Stroke Mother   . Diabetes Mother   . Varicose Veins Mother   . Diabetes Brother   . Diabetes Daughter   . Hyperlipidemia Daughter   . Hypertension Daughter   . Varicose Veins Daughter   . Hyperlipidemia Son   . Hypertension Son   . Hyperlipidemia Daughter     Social History Social History  Substance Use Topics  . Smoking status: Former Smoker    Quit date: 08/12/1983  . Smokeless tobacco: Never Used  . Alcohol use No     Allergies   Patient has no known allergies.   Review of Systems Review of Systems  Constitutional: Negative for fever.  Gastrointestinal: Positive for abdominal pain. Negative for anorexia, hematochezia, melena and vomiting.  All other systems reviewed and are negative.    Physical Exam Updated Vital Signs BP (!) 179/84 (BP Location: Left Arm)   Pulse 73  Temp 98.7 F (37.1 C) (Oral)   Resp 18   Ht 5\' 6"  (1.676 m)   Wt 165 lb (74.8 kg)   SpO2 96%   BMI 26.63 kg/m   Physical Exam  Constitutional: He appears well-developed and well-nourished.  HENT:  Head: Normocephalic and atraumatic.  Eyes: Conjunctivae and EOM are normal.  Neck: Normal range of motion.  Cardiovascular: Normal rate.   Pulmonary/Chest: Effort normal. No respiratory distress.  Abdominal: Soft. Bowel sounds are normal. He exhibits no distension. There is no tenderness. There is no guarding.  Musculoskeletal: Normal range of motion.  Neurological: He is alert.  Skin: Skin is warm and dry.  Nursing note and vitals  reviewed.    ED Treatments / Results  Labs (all labs ordered are listed, but only abnormal results are displayed) Labs Reviewed  COMPREHENSIVE METABOLIC PANEL - Abnormal; Notable for the following:       Result Value   Chloride 98 (*)    BUN 24 (*)    Calcium 8.7 (*)    GFR calc non Af Amer 55 (*)    All other components within normal limits  CBC - Abnormal; Notable for the following:    RBC 3.86 (*)    Hemoglobin 11.4 (*)    HCT 34.7 (*)    All other components within normal limits  URINALYSIS, ROUTINE W REFLEX MICROSCOPIC - Abnormal; Notable for the following:    Color, Urine STRAW (*)    Hgb urine dipstick MODERATE (*)    All other components within normal limits  LIPASE, BLOOD    EKG  EKG Interpretation None       Radiology No results found.  Procedures Procedures (including critical care time)  Medications Ordered in ED Medications  sodium chloride 0.9 % bolus 1,000 mL (not administered)     Initial Impression / Assessment and Plan / ED Course  I have reviewed the triage vital signs and the nursing notes.  Pertinent labs & imaging results that were available during my care of the patient were reviewed by me and considered in my medical decision making (see chart for details).    Will ct to eval aorta and spleen. Possibly gastritis/reflux. Possibly malignancy. Will dispo based on CT scan.   Final Clinical Impressions(s) / ED Diagnoses   Final diagnoses:  None    New Prescriptions New Prescriptions   No medications on file     Marily Memos, MD 12/04/16 1436

## 2016-12-12 ENCOUNTER — Other Ambulatory Visit (HOSPITAL_COMMUNITY): Payer: Self-pay | Admitting: Pulmonary Disease

## 2016-12-12 DIAGNOSIS — R413 Other amnesia: Secondary | ICD-10-CM

## 2016-12-21 ENCOUNTER — Ambulatory Visit (HOSPITAL_COMMUNITY)
Admission: RE | Admit: 2016-12-21 | Discharge: 2016-12-21 | Disposition: A | Discharge status: Home / Self Care | Payer: PPO | Source: Ambulatory Visit | Attending: Pulmonary Disease | Admitting: Pulmonary Disease

## 2016-12-21 DIAGNOSIS — R413 Other amnesia: Secondary | ICD-10-CM | POA: Insufficient documentation

## 2016-12-21 DIAGNOSIS — I6782 Cerebral ischemia: Secondary | ICD-10-CM | POA: Diagnosis not present

## 2016-12-21 DIAGNOSIS — G3189 Other specified degenerative diseases of nervous system: Secondary | ICD-10-CM | POA: Diagnosis not present

## 2017-04-22 ENCOUNTER — Encounter: Payer: Self-pay | Admitting: Family Medicine

## 2017-04-22 ENCOUNTER — Encounter (INDEPENDENT_AMBULATORY_CARE_PROVIDER_SITE_OTHER): Payer: Self-pay

## 2017-04-22 ENCOUNTER — Ambulatory Visit (INDEPENDENT_AMBULATORY_CARE_PROVIDER_SITE_OTHER): Payer: PPO | Admitting: Family Medicine

## 2017-04-22 ENCOUNTER — Ambulatory Visit (INDEPENDENT_AMBULATORY_CARE_PROVIDER_SITE_OTHER): Payer: PPO

## 2017-04-22 VITALS — BP 132/67 | HR 73 | Temp 97.3°F | Ht 66.0 in | Wt 160.0 lb

## 2017-04-22 DIAGNOSIS — E039 Hypothyroidism, unspecified: Secondary | ICD-10-CM | POA: Insufficient documentation

## 2017-04-22 DIAGNOSIS — Q676 Pectus excavatum: Secondary | ICD-10-CM | POA: Diagnosis not present

## 2017-04-22 DIAGNOSIS — E890 Postprocedural hypothyroidism: Secondary | ICD-10-CM | POA: Diagnosis not present

## 2017-04-22 DIAGNOSIS — M25512 Pain in left shoulder: Secondary | ICD-10-CM | POA: Diagnosis not present

## 2017-04-22 DIAGNOSIS — R011 Cardiac murmur, unspecified: Secondary | ICD-10-CM | POA: Diagnosis not present

## 2017-04-22 DIAGNOSIS — G8929 Other chronic pain: Secondary | ICD-10-CM | POA: Diagnosis not present

## 2017-04-22 DIAGNOSIS — I10 Essential (primary) hypertension: Secondary | ICD-10-CM

## 2017-04-22 DIAGNOSIS — D649 Anemia, unspecified: Secondary | ICD-10-CM

## 2017-04-22 NOTE — Progress Notes (Signed)
BP 132/67   Pulse 73   Temp (!) 97.3 F (36.3 C) (Oral)   Ht _0  (1.676 m)   Wt 160 lb (72.6 kg)   BMI 25.82 kg/m    Subjective:    Patient ID: Dan Perez, male    DOB: 1932/05/26, 83 y.o.   MRN: [id number]  HPI: Dan Perez is a 83 y.o. male presenting on 04/22/2017 for New Patient (Initial Visit)   HPI Hypertension Patient is a new patient to establish with our practice. Patient is currently on Lisinopril, and their blood pressure today is 132/67. Patient denies any lightheadedness or dizziness. Patient denies headaches, blurred vision, chest pains, shortness of breath, or weakness. Denies any side effects from medication and is content with current medication. He has been on the low dose of medication for quite some time and has been working well for him.  Hypothyroidism recheck Patient is coming in for thyroid recheck today as well. They deny any issues with hair changes or heat or cold problems or diarrhea or constipation. They deny any chest pain or palpitations. They are currently on levothyroxine 25 micrograms 2 times per week.  Abdominal pressure/chest deformity Patient complains of abdominal pressure/pain/chest deformity. He has had a chest deformity that has worsened over his life where his chest goes in and get some pressure in the left upper side of his abdomen and lower ribs on that side. He denies any nausea or vomiting or constipation or diarrhea. He denies any blood in his stool. He denies any shortness of breath. The patient does have a significant pectus excavatum and his daughter was worried about whether or not he could have Marfan syndrome  Anemia Patient has a history of anemia but has not had it checked in some time. He denies any lightheadedness or dizziness or increased fatigue from what he normally is. He denies any bleeding episode that he knows of.  Left shoulder pain Patient has been having left shoulder pain that has been bothering him for  at least a few months if not closer to a year they can remember. The daughter says that it has gradually worsened and is having more pain with mostly overhead range of motion. The pain is described as being in the center of the shoulder and deep is a dull achy pain. The pain is not there all of the time but comes and goes with certain motions and if he sleeps on that side of his body. He does have some weakness in that shoulder especially with overhead range of motion but denies any numbness. He denies any neck pain  Relevant past medical, surgical, family and social history reviewed and updated as indicated. Interim medical history since our last visit reviewed. Allergies and medications reviewed and updated.  Review of Systems  Constitutional: Negative for chills, fatigue and fever.  HENT: Negative for ear pain and tinnitus.   Eyes: Negative for pain and discharge.  Respiratory: Negative for cough, shortness of breath and wheezing.   Cardiovascular: Negative for chest pain, palpitations and leg swelling.  Gastrointestinal: Positive for abdominal pain. Negative for blood in stool, constipation and diarrhea.  Endocrine: Negative for cold intolerance and heat intolerance.  Genitourinary: Negative for dysuria and hematuria.  Musculoskeletal: Positive for arthralgias. Negative for back pain, gait problem and myalgias.  Skin: Negative for rash.  Neurological: Negative for dizziness, weakness and headaches.  Psychiatric/Behavioral: Negative for suicidal ideas.  All other systems reviewed and are negative.   Per HPI  unless specifically indicated above  Social History   Social History  . Marital status: Single    Spouse name: N/A  . Number of children: N/A  . Years of education: N/A   Occupational History  . Not on file.   Social History Main Topics  . Smoking status: Former Smoker    Quit date: 08/12/1983  . Smokeless tobacco: Never Used  . Alcohol use No  . Drug use: No  . Sexual  activity: Not Currently   Other Topics Concern  . Not on file   Social History Narrative  . No narrative on file    Past Surgical History:  Procedure Laterality Date  . CATARACT EXTRACTION, BILATERAL    . EYE SURGERY Left 2004   Cataract   . EYE SURGERY Right 2004   Cataract   . INGUINAL HERNIA REPAIR Right 12/17/2013   Procedure: HERNIA REPAIR INGUINAL ADULT;  Surgeon: Scherry Ran, MD;  Location: [facility];  Service: General;  Laterality: Right;  . THYROIDECTOMY  03/02/2011   Left lobectomy and isthmusectomy -     Family History  Problem Relation Age of Onset  . Heart attack Father   . Dementia Mother   . Stroke Mother   . Diabetes Mother   . Varicose Veins Mother   . Diabetes Brother   . Diabetes Daughter   . Hyperlipidemia Daughter   . Hypertension Daughter   . Varicose Veins Daughter   . Hyperlipidemia Son   . Hypertension Son   . Hyperlipidemia Daughter     Allergies as of 04/22/2017   No Known Allergies     Medication List       Accurate as of 04/22/17  9:55 AM. Always use your most recent med list.          furosemide 20 MG tablet Commonly known as:  LASIX Take 20 mg by mouth daily as needed for edema.   LEVOTHROID 25 MCG tablet Generic drug:  levothyroxine Take 25 mcg by mouth 2 (two) times a week.   lisinopril 5 MG tablet Commonly known as:  PRINIVIL,ZESTRIL Take 5 mg by mouth daily.          Objective:    BP 132/67   Pulse 73   Temp (!) 97.3 F (36.3 C) (Oral)   Ht _0  (1.676 m)   Wt 160 lb (72.6 kg)   BMI 25.82 kg/m   Wt Readings from Last 3 Encounters:  04/22/17 160 lb (72.6 kg)  12/03/16 165 lb (74.8 kg)  11/30/14 172 lb (78 kg)    Physical Exam  Constitutional: He is oriented to person, place, and time. He appears well-developed and well-nourished. No distress.  Eyes: Conjunctivae are normal. No scleral icterus.  Cardiovascular: Normal rate, regular rhythm, normal heart sounds and intact distal pulses.   No murmur  heard. Pulmonary/Chest: Effort normal and breath sounds normal. No respiratory distress. He has no decreased breath sounds. He has no wheezes. He has no rhonchi. He has no rales. He exhibits deformity (Significant pectus excavated and going in approximately 4 cm from his external chest wall). He exhibits no mass and no bony tenderness.  Abdominal: Soft. Bowel sounds are normal. He exhibits no distension. There is no tenderness. There is no rebound and no guarding.  Musculoskeletal: Normal range of motion. He exhibits no edema.  Neurological: He is alert and oriented to person, place, and time. Coordination normal.  Skin: Skin is warm and dry. No rash noted. He is  not diaphoretic.  Psychiatric: He has a normal mood and affect. His behavior is normal.  Nursing note and vitals reviewed.  Left shoulder x-ray: No acute bony abnormalities noted, patient does have mild to moderate amount of degeneration     Assessment & Plan:   Problem List Items Addressed This Visit      Cardiovascular and Mediastinum   Essential hypertension, benign - Primary   Relevant Orders   CMP14+EGFR (Completed)     Endocrine   Hypothyroid   Relevant Orders   Thyroid Panel With TSH (Completed)    Other Visit Diagnoses    Systolic murmur       Relevant Orders   ECHOCARDIOGRAM COMPLETE   Anemia, unspecified type       Relevant Orders   CBC with Differential/Platelet (Completed)   Congenital pectus excavatum       Chronic left shoulder pain       Relevant Orders   DG Shoulder Left (Completed)       Follow up plan: Return in about 4 weeks (around 05/20/2017), or if symptoms worsen or fail to improve, for recheck thyroid.  Caryl Pina, MD [facility] 04/22/2017, 9:55 AM     w

## 2017-04-23 ENCOUNTER — Encounter: Payer: Self-pay | Admitting: Family Medicine

## 2017-04-23 LAB — CBC WITH DIFFERENTIAL/PLATELET
BASOS ABS: 0.1 10*3/uL (ref 0.0–0.2)
Basos: 1 %
EOS (ABSOLUTE): 0.3 10*3/uL (ref 0.0–0.4)
EOS: 5 %
HEMATOCRIT: 35.5 % — AB (ref 37.5–51.0)
Hemoglobin: 11.6 g/dL — ABNORMAL LOW (ref 13.0–17.7)
IMMATURE GRANULOCYTES: 0 %
Immature Grans (Abs): 0 10*3/uL (ref 0.0–0.1)
LYMPHS ABS: 1.9 10*3/uL (ref 0.7–3.1)
Lymphs: 32 %
MCH: 29.2 pg (ref 26.6–33.0)
MCHC: 32.7 g/dL (ref 31.5–35.7)
MCV: 89 fL (ref 79–97)
Monocytes Absolute: 0.7 10*3/uL (ref 0.1–0.9)
Monocytes: 11 %
NEUTROS PCT: 51 %
Neutrophils Absolute: 2.9 10*3/uL (ref 1.4–7.0)
PLATELETS: 238 10*3/uL (ref 150–379)
RBC: 3.97 x10E6/uL — ABNORMAL LOW (ref 4.14–5.80)
RDW: 14.3 % (ref 12.3–15.4)
WBC: 5.9 10*3/uL (ref 3.4–10.8)

## 2017-04-23 LAB — CMP14+EGFR
ALK PHOS: 59 IU/L (ref 39–117)
ALT: 12 IU/L (ref 0–44)
AST: 21 IU/L (ref 0–40)
Albumin/Globulin Ratio: 1.5 (ref 1.2–2.2)
Albumin: 4.3 g/dL (ref 3.5–4.7)
BUN/Creatinine Ratio: 13 (ref 10–24)
BUN: 17 mg/dL (ref 8–27)
Bilirubin Total: 0.4 mg/dL (ref 0.0–1.2)
CO2: 24 mmol/L (ref 20–29)
Calcium: 9.4 mg/dL (ref 8.6–10.2)
Chloride: 99 mmol/L (ref 96–106)
Creatinine, Ser: 1.32 mg/dL — ABNORMAL HIGH (ref 0.76–1.27)
GFR calc Af Amer: 58 mL/min/{1.73_m2} — ABNORMAL LOW (ref >59)
GFR calc non Af Amer: 50 mL/min/{1.73_m2} — ABNORMAL LOW (ref >59)
Globulin, Total: 2.8 g/dL (ref 1.5–4.5)
Glucose: 98 mg/dL (ref 65–99)
POTASSIUM: 4.7 mmol/L (ref 3.5–5.2)
Sodium: 139 mmol/L (ref 134–144)
Total Protein: 7.1 g/dL (ref 6.0–8.5)

## 2017-04-23 LAB — THYROID PANEL WITH TSH
Free Thyroxine Index: 2.8 (ref 1.2–4.9)
T3 UPTAKE RATIO: 28 % (ref 24–39)
T4 TOTAL: 9.9 ug/dL (ref 4.5–12.0)
TSH: 1.44 u[IU]/mL (ref 0.450–4.500)

## 2017-04-27 ENCOUNTER — Encounter: Payer: Self-pay | Admitting: Family Medicine

## 2017-04-30 ENCOUNTER — Other Ambulatory Visit: Payer: Self-pay

## 2017-04-30 ENCOUNTER — Ambulatory Visit (HOSPITAL_COMMUNITY)
Admission: RE | Admit: 2017-04-30 | Discharge: 2017-04-30 | Disposition: A | Discharge status: Home / Self Care | Payer: PPO | Source: Ambulatory Visit | Attending: Family Medicine | Admitting: Family Medicine

## 2017-04-30 DIAGNOSIS — R011 Cardiac murmur, unspecified: Secondary | ICD-10-CM | POA: Diagnosis not present

## 2017-04-30 DIAGNOSIS — I10 Essential (primary) hypertension: Secondary | ICD-10-CM | POA: Diagnosis not present

## 2017-04-30 DIAGNOSIS — Z87891 Personal history of nicotine dependence: Secondary | ICD-10-CM | POA: Diagnosis not present

## 2017-04-30 DIAGNOSIS — I083 Combined rheumatic disorders of mitral, aortic and tricuspid valves: Secondary | ICD-10-CM | POA: Diagnosis not present

## 2017-04-30 LAB — ECHOCARDIOGRAM COMPLETE
AOPV: 0.3 m/s
AV Area VTI index: 0.66 cm2/m2
AV Area VTI: 0.96 cm2
AV Area mean vel: 1.17 cm2
AV Mean grad: 18 mmHg
AV Peak grad: 43 mmHg
AV VEL mean LVOT/AV: 0.37
AV area mean vel ind: 0.63 cm2/m2
AV peak Index: 0.52
AV vel: 1.23
AVPKVEL: 326 cm/s
CHL CUP MV DEC (S): 634
DOP CAL AO MEAN VELOCITY: 190 cm/s
E decel time: 634 msec
E/e' ratio: 5.52
FS: 32 % (ref 28–44)
IVS/LV PW RATIO, ED: 0.98
LA ID, A-P, ES: 34 mm
LA diam end sys: 34 mm
LA diam index: 1.84 cm/m2
LA vol index: 28.4 mL/m2
LAVOL: 52.5 mL
LAVOLA4C: 50.2 mL
LV dias vol index: 38 mL/m2
LV dias vol: 70 mL (ref 62–150)
LV e' LATERAL: 10.7 cm/s
LVEEAVG: 5.52
LVEEMED: 5.52
LVOT SV: 74 mL
LVOT VTI: 23.5 cm
LVOT area: 3.14 cm2
LVOT diameter: 20 mm
LVOT peak VTI: 0.39 cm
LVOT peak grad rest: 4 mmHg
LVOTPV: 99.2 cm/s
LVSYSVOL: 26 mL (ref 21–61)
LVSYSVOLIN: 14 mL/m2
Lateral S' vel: 14.4 cm/s
MV pk A vel: 94.7 m/s
MV pk E vel: 59.1 m/s
PW: 11.2 mm — AB (ref 0.6–1.1)
Simpson's disk: 63
Stroke v: 44 ml
TAPSE: 18.7 mm
TDI e' lateral: 10.7
TDI e' medial: 7.07
VTI: 59.9 cm
Valve area index: 0.66
Valve area: 1.23 cm2

## 2017-04-30 MED ORDER — LISINOPRIL 5 MG PO TABS: 5.0000 mg | ORAL_TABLET | Freq: Every day | ORAL | 1 refills | Status: DC

## 2017-04-30 NOTE — Progress Notes (Signed)
*  PRELIMINARY RESULTS* Echocardiogram 2D Echocardiogram has been performed.  Stacey DrainWhite, Rayquan Amrhein J 04/30/2017, 2:09 PM

## 2017-05-13 ENCOUNTER — Other Ambulatory Visit: Payer: Self-pay | Admitting: *Deleted

## 2017-05-13 DIAGNOSIS — R931 Abnormal findings on diagnostic imaging of heart and coronary circulation: Secondary | ICD-10-CM

## 2017-05-22 ENCOUNTER — Encounter: Payer: Self-pay | Admitting: Family Medicine

## 2017-05-22 ENCOUNTER — Ambulatory Visit (INDEPENDENT_AMBULATORY_CARE_PROVIDER_SITE_OTHER): Payer: PPO | Admitting: Family Medicine

## 2017-05-22 VITALS — BP 159/83 | HR 72 | Temp 97.5°F | Ht 66.0 in | Wt 162.0 lb

## 2017-05-22 DIAGNOSIS — E890 Postprocedural hypothyroidism: Secondary | ICD-10-CM

## 2017-05-22 DIAGNOSIS — Q676 Pectus excavatum: Secondary | ICD-10-CM | POA: Diagnosis not present

## 2017-05-22 DIAGNOSIS — R030 Elevated blood-pressure reading, without diagnosis of hypertension: Secondary | ICD-10-CM

## 2017-05-22 MED ORDER — BACLOFEN 10 MG PO TABS
10.0000 mg | ORAL_TABLET | Freq: Three times a day (TID) | ORAL | 0 refills | Status: DC
Start: 2017-05-22 — End: 2017-09-26

## 2017-05-22 NOTE — Progress Notes (Signed)
BP (!) 159/83   Pulse 72   Temp (!) 97.5 F (36.4 C) (Oral)   Ht 5\' 6"  (1.676 m)   Wt 162 lb (73.5 kg)   BMI 26.15 kg/m    Subjective:    Patient ID: Dan Perez, male    DOB: Feb 05, 1932, 83 y.o.   MRN: [id number]  HPI: Dan Perez is a 83 y.o. male presenting on 05/22/2017 for Hypothyroidism (4 wk follow up); Discuss echocardiogram; and Flank Pain   HPI Hypothyroidism recheck Patient is coming in for thyroid recheck today as well. They deny any issues with hair changes or heat or cold problems or diarrhea or constipation. They deny any chest pain or palpitations. They are currently on levothyroxine 25 micrograms   Left rib pain Patient comes in complaining of left rib pain that has continued due to his severe pectus excavatum rib deformities. He has been using Tylenol for without much success. He is wondering if there is anything to do that could help with this.  Relevant past medical, surgical, family and social history reviewed and updated as indicated. Interim medical history since our last visit reviewed. Allergies and medications reviewed and updated.  Review of Systems  Constitutional: Negative for chills and fever.  Eyes: Negative for discharge.  Respiratory: Negative for shortness of breath and wheezing.   Cardiovascular: Positive for chest pain. Negative for palpitations and leg swelling.  Endocrine: Negative for cold intolerance and heat intolerance.  Musculoskeletal: Negative for back pain and gait problem.  Skin: Negative for rash.  All other systems reviewed and are negative.   Per HPI unless specifically indicated above     Objective:    BP (!) 159/83   Pulse 72   Temp (!) 97.5 F (36.4 C) (Oral)   Ht 5\' 6"  (1.676 m)   Wt 162 lb (73.5 kg)   BMI 26.15 kg/m   Wt Readings from Last 3 Encounters:  05/22/17 162 lb (73.5 kg)  04/22/17 160 lb (72.6 kg)  12/03/16 165 lb (74.8 kg)    Physical Exam  Constitutional: He is oriented to person,  place, and time. He appears well-developed and well-nourished. No distress.  Eyes: Conjunctivae are normal. No scleral icterus.  Cardiovascular: Normal rate, regular rhythm, normal heart sounds and intact distal pulses.   No murmur heard. Pulmonary/Chest: Effort normal and breath sounds normal. No respiratory distress. He has no wheezes. He has no rales. He exhibits tenderness (Left lower chest wall tenderness due to severe pectus excavatum and overlapping and turning in of rib cage) and deformity (Severe pectus excavatum).  Musculoskeletal: Normal range of motion. He exhibits no edema.  Neurological: He is alert and oriented to person, place, and time. Coordination normal.  Skin: Skin is warm and dry. No rash noted. He is not diaphoretic.  Psychiatric: He has a normal mood and affect. His behavior is normal.  Nursing note and vitals reviewed.     Assessment & Plan:   Problem List Items Addressed This Visit      Endocrine   Hypothyroid - Primary   Relevant Orders   TSH (Completed)    Other Visit Diagnoses    Congenital pectus excavatum       Left chest wall pain   Relevant Medications   baclofen (LIORESAL) 10 MG tablet   Elevated blood pressure reading       Patient has elevated blood pressure today because of the pain that is having in his ribs  Follow up plan: Return in about 6 months (around 11/19/2017), or if symptoms worsen or fail to improve, for Recheck thyroid.  Counseling provided for all of the vaccine components Orders Placed This Encounter  Procedures  . TSH    Arville Care, MD [facility] 05/22/2017, 2:04 PM

## 2017-05-23 LAB — TSH: TSH: 1.42 u[IU]/mL (ref 0.450–4.500)

## 2017-05-24 ENCOUNTER — Ambulatory Visit (INDEPENDENT_AMBULATORY_CARE_PROVIDER_SITE_OTHER): Payer: PPO | Admitting: Cardiovascular Disease

## 2017-05-24 ENCOUNTER — Encounter: Payer: Self-pay | Admitting: Cardiovascular Disease

## 2017-05-24 VITALS — BP 151/74 | HR 65 | Ht 67.0 in | Wt 163.0 lb

## 2017-05-24 DIAGNOSIS — I351 Nonrheumatic aortic (valve) insufficiency: Secondary | ICD-10-CM

## 2017-05-24 DIAGNOSIS — I10 Essential (primary) hypertension: Secondary | ICD-10-CM

## 2017-05-24 DIAGNOSIS — I350 Nonrheumatic aortic (valve) stenosis: Secondary | ICD-10-CM

## 2017-05-24 NOTE — Patient Instructions (Signed)
Medication Instructions:  Continue all current medications.  Labwork: none  Testing/Procedures: Your physician has requested that you have an echocardiogram. Echocardiography is a painless test that uses sound waves to create images of your heart. It provides your doctor with information about the size and shape of your heart and how well your heart's chambers and valves are working. This procedure takes approximately one hour. There are no restrictions for this procedure -  DUE JUST PRIOR TO NEXT OFFICE VISIT   Follow-Up: Your physician wants you to follow up in:  1 year.  You will receive a reminder letter in the mail one-two months in advance.  If you don't receive a letter, please call our office to schedule the follow up appointment.    Any Other Special Instructions Will Be Listed Below (If Applicable).  If you need a refill on your cardiac medications before your next appointment, please call your pharmacy.  

## 2017-05-24 NOTE — Progress Notes (Addendum)
CARDIOLOGY CONSULT NOTE  Patient ID: Dan Perez MRN: [id number] DOB/AGE: Jan 23, 1932 83 y.o.  Admit date: (Not on file) Primary Physician: Dettinger, Elige Radon, MD Referring Physician:  Dettinger  Reason for Consultation: abnormal echocardiogram  HPI: Dan Perez is a 83 y.o. male who is being seen today for the evaluation of an abnormal echocardiogram at the request of Dettinger, Elige Radon, MD.   He has a history of hypertension, hypothyroidism, and severe pectus excavatum.  Echocardiogram 04/30/17 demonstrated normal left ventricular systolic function and regional wall motion, LVEF 60-65%, mild LVH, grade 1 diastolic dysfunction, moderate aortic stenosis with a mean gradient of 23 mmHg and peak velocity of 326 cm/s, and mild to moderate aortic regurgitation. There was mild mitral regurgitation, and elevated central venous pressures.  He is a very pleasant man. He is here with his daughter who is a Lawyer. She sees Dr. Wyline Mood for dysautonomia. He used to work in a Medical laboratory scientific officer for 30 years.  He denies chest pain and has occasional chest pressure when walking up an incline. He has noticed this for the past year. He also feels a bandlike sensation which can occur with various positions.  He denies lightheadedness, dizziness, and syncope. He has occasional palpitations. He does have some mild exertional dyspnea when walking up an incline. He denies leg swelling, orthopnea, and paroxysmal nocturnal dyspnea.  ECG performed in the office today which I ordered and personally interpreted demonstrates normal sinus rhythm with no ischemic ST segment or T-wave abnormalities with PAC's.   No Known Allergies  Current Outpatient Prescriptions  Medication Sig Dispense Refill  . furosemide (LASIX) 20 MG tablet Take 20 mg by mouth daily as needed for edema.     Marland Kitchen levothyroxine (LEVOTHROID) 25 MCG tablet Take 25 mcg by mouth 2 (two) times a week.     Marland Kitchen lisinopril (PRINIVIL,ZESTRIL) 5  MG tablet Take 1 tablet (5 mg total) by mouth daily. 90 tablet 1  . naproxen sodium (ANAPROX) 220 MG tablet Take 220 mg by mouth daily.    . baclofen (LIORESAL) 10 MG tablet Take 1 tablet (10 mg total) by mouth 3 (three) times daily. (Patient not taking: Reported on 05/24/2017) 60 tablet 0   No current facility-administered medications for this visit.     Past Medical History:  Diagnosis Date  . AAA (abdominal aortic aneurysm) without rupture (HCC)    [place] (11/03/2013): 3.9 x 3.3 cm  . Chronic abdominal pain   . Delusional disorder (HCC)    chronic  . Essential hypertension, benign   . History of goiter   . Paranoia (HCC)   . Right inguinal hernia    to see Dr. Malvin Johns for repair    Past Surgical History:  Procedure Laterality Date  . CATARACT EXTRACTION, BILATERAL    . EYE SURGERY Left 2004   Cataract   . EYE SURGERY Right 2004   Cataract   . INGUINAL HERNIA REPAIR Right 12/17/2013   Procedure: HERNIA REPAIR INGUINAL ADULT;  Surgeon: Marlane Hatcher, MD;  Location: [facility];  Service: General;  Laterality: Right;  . THYROIDECTOMY  03/02/2011   Left lobectomy and isthmusectomy -     Social History   Social History  . Marital status: Divorced    Spouse name: N/A  . Number of children: N/A  . Years of education: N/A   Occupational History  . Not on file.   Social History Main Topics  . Smoking status: Former Smoker  Quit date: 08/12/1983  . Smokeless tobacco: Never Used  . Alcohol use No  . Drug use: No  . Sexual activity: Not Currently   Other Topics Concern  . Not on file   Social History Narrative  . No narrative on file     No family history of premature CAD in 1st degree relatives.  Current Meds  Medication Sig  . furosemide (LASIX) 20 MG tablet Take 20 mg by mouth daily as needed for edema.   Marland Kitchen. levothyroxine (LEVOTHROID) 25 MCG tablet Take 25 mcg by mouth 2 (two) times a week.   Marland Kitchen. lisinopril (PRINIVIL,ZESTRIL) 5 MG tablet Take 1 tablet (5 mg total)  by mouth daily.  . naproxen sodium (ANAPROX) 220 MG tablet Take 220 mg by mouth daily.      Review of systems complete and found to be negative unless listed above in HPI    Physical exam Blood pressure (!) 151/74, pulse 65, height 5\' 7"  (1.702 m), weight 163 lb (73.9 kg), SpO2 96 %. General: NAD Neck: No JVD, no thyromegaly or thyroid nodule.  Lungs: Clear to auscultation bilaterally with normal respiratory effort. CV: +Pectus excavatum. Regular rate and rhythm, normal S1/S2, no S3/S4, 2/6 ejection systolic murmur heard throughout precordium.  No peripheral edema.  No carotid bruit.    Abdomen: Soft, nontender, no distention.  Skin: Intact without lesions or rashes.  Neurologic: Alert and oriented x 3.  Psych: Normal affect. Extremities: No clubbing or cyanosis.  HEENT: Normal.   ECG: Most recent ECG reviewed.   Labs: Lab Results  Component Value Date/Time   K 4.7 04/22/2017 10:19 AM   BUN 17 04/22/2017 10:19 AM   CREATININE 1.32 (H) 04/22/2017 10:19 AM   ALT 12 04/22/2017 10:19 AM   TSH 1.420 05/22/2017 02:07 PM   HGB 11.6 (L) 04/22/2017 10:19 AM     Lipids: No results found for: LDLCALC, LDLDIRECT, CHOL, TRIG, HDL      ASSESSMENT AND PLAN:  1. Moderate aortic stenosis and mild to moderate aortic regurgitation: Overall symptoms appear to be stable. I educated both the patient and his daughter about eventual disease progression and the need for valve replacement (perhaps TAVR). I told him about symptoms to be mindful of such as chest pain, progressive exertional dyspnea, and syncope. I will repeat an echocardiogram in 1 year.  2. Hypertension: Mildly elevated. Will need further monitoring. No changes to therapy.     Disposition: Follow up in 1 year   Signed: Prentice DockerSuresh Koneswaran, M.D., F.A.C.C.  05/24/2017, 1:40 PM

## 2017-05-27 ENCOUNTER — Telehealth: Payer: Self-pay | Admitting: Cardiovascular Disease

## 2017-05-27 NOTE — Telephone Encounter (Signed)
Pre-cert Verification for the following procedure   Echo set for 05-28-17

## 2017-05-29 NOTE — Addendum Note (Signed)
Addended by: Norva PavlovJOYCE, Shontay Wallner on: 05/29/2017 02:43 PM   Modules accepted: Orders

## 2017-09-19 ENCOUNTER — Encounter: Payer: Self-pay | Admitting: Family Medicine

## 2017-09-19 DIAGNOSIS — Q676 Pectus excavatum: Secondary | ICD-10-CM

## 2017-09-25 ENCOUNTER — Other Ambulatory Visit: Payer: Self-pay

## 2017-09-25 DIAGNOSIS — Q8740 Marfan's syndrome, unspecified: Secondary | ICD-10-CM

## 2017-09-25 NOTE — Progress Notes (Unsigned)
optha

## 2017-09-26 ENCOUNTER — Ambulatory Visit (INDEPENDENT_AMBULATORY_CARE_PROVIDER_SITE_OTHER): Payer: PPO

## 2017-09-26 ENCOUNTER — Ambulatory Visit (INDEPENDENT_AMBULATORY_CARE_PROVIDER_SITE_OTHER): Payer: PPO | Admitting: Family Medicine

## 2017-09-26 ENCOUNTER — Encounter: Payer: Self-pay | Admitting: Family Medicine

## 2017-09-26 VITALS — BP 160/82 | HR 63 | Temp 96.9°F | Ht 67.0 in | Wt 158.0 lb

## 2017-09-26 DIAGNOSIS — R0781 Pleurodynia: Secondary | ICD-10-CM

## 2017-09-26 DIAGNOSIS — R410 Disorientation, unspecified: Secondary | ICD-10-CM | POA: Diagnosis not present

## 2017-09-26 LAB — MICROSCOPIC EXAMINATION
Bacteria, UA: NONE SEEN
EPITHELIAL CELLS (NON RENAL): NONE SEEN /HPF (ref 0–10)
Renal Epithel, UA: NONE SEEN /hpf
WBC, UA: NONE SEEN /hpf (ref 0–?)

## 2017-09-26 LAB — URINALYSIS, COMPLETE
BILIRUBIN UA: NEGATIVE
Glucose, UA: NEGATIVE
Ketones, UA: NEGATIVE
Leukocytes, UA: NEGATIVE
NITRITE UA: NEGATIVE
Protein, UA: NEGATIVE
Urobilinogen, Ur: 0.2 mg/dL (ref 0.2–1.0)
pH, UA: 6.5 (ref 5.0–7.5)

## 2017-09-26 NOTE — Progress Notes (Signed)
BP (!) 166/88   Pulse 63   Temp (!) 96.9 F (36.1 C) (Oral)   Ht 5\' 7"  (1.702 m)   Wt 158 lb (71.7 kg)   BMI 24.75 kg/m    Subjective:    Patient ID: Dan Perez, male    DOB: Dec 14, 1931, 84 y.o.   MRN: [id number]  HPI: Dan Perez is a 84 y.o. male presenting on 09/26/2017 for Left rib pain (began after a fall on 09/20/17)   HPI Left rib pain Patient has left-sided chest wall and rib pain after a fall that occurred on 1/ 12/2017.  He lost his balance and landed on that side of his body against a piece of furniture and it hit right in that lateral aspect of his left chest wall.  He denies any shortness of breath or cough or congestion.  He says the pain does hurt worse with deep inspiration.  He denies any pain radiating anywhere else.  He has not noticed any bruising.  Increased confusion and memory issues His daughter who is here with him says that he has been having increased confusion and memory issues and she is concerned that he may be having some dementia but does not necessarily want to go as far as putting him in a location such as a home but she wants to look into his memory further and see if anything can help.  He is due for his annual wellness visit and we will have him come back for that and do the full memory testing and see where he is at and then discussed medications after that.  Relevant past medical, surgical, family and social history reviewed and updated as indicated. Interim medical history since our last visit reviewed. Allergies and medications reviewed and updated.  Review of Systems  Constitutional: Negative for chills and fever.  HENT: Negative for congestion.   Respiratory: Negative for cough, chest tightness, shortness of breath and wheezing.   Cardiovascular: Positive for chest pain. Negative for palpitations and leg swelling.  Musculoskeletal: Negative for back pain and gait problem.  Skin: Negative for color change and rash.    Psychiatric/Behavioral: Positive for confusion. Negative for dysphoric mood, self-injury, sleep disturbance and suicidal ideas. The patient is not nervous/anxious.   All other systems reviewed and are negative.   Per HPI unless specifically indicated above        Objective:    BP (!) 166/88   Pulse 63   Temp (!) 96.9 F (36.1 C) (Oral)   Ht 5\' 7"  (1.702 m)   Wt 158 lb (71.7 kg)   BMI 24.75 kg/m   Wt Readings from Last 3 Encounters:  09/26/17 158 lb (71.7 kg)  05/24/17 163 lb (73.9 kg)  05/22/17 162 lb (73.5 kg)    Physical Exam  Constitutional: He is oriented to person, place, and time. He appears well-developed and well-nourished. No distress.  Eyes: Conjunctivae are normal. No scleral icterus.  Neck: Neck supple. No thyromegaly present.  Cardiovascular: Normal rate, regular rhythm, normal heart sounds and intact distal pulses.  No murmur heard. Pulmonary/Chest: Effort normal and breath sounds normal. No respiratory distress. He has no wheezes.  Musculoskeletal: Normal range of motion. He exhibits no edema.  Lymphadenopathy:    He has no cervical adenopathy.  Neurological: He is alert and oriented to person, place, and time. Coordination normal.  Skin: Skin is warm and dry. No rash noted. He is not diaphoretic.  Psychiatric: He has a normal mood and  affect. His behavior is normal. Judgment normal. Cognition and memory are impaired. He expresses no suicidal ideation. He expresses no suicidal plans. He exhibits abnormal recent memory. He exhibits normal remote memory.  Nursing note and vitals reviewed.       Assessment & Plan:   Problem List Items Addressed This Visit    None    Visit Diagnoses    Rib pain on left side    -  Primary   Left-sided rib pain from fall, no sign of fracture on x-ray, deep breathing exercises and follow-up as needed   Relevant Orders   DG Ribs Unilateral W/Chest Left (Completed)   Confusion       Patient has increased confusion will  get him to come in for annual wellness visit for memory testing and do a urinalysis   Relevant Orders   Urinalysis, Complete (Completed)       Follow up plan: Return if symptoms worsen or fail to improve.  Counseling provided for all of the vaccine components Orders Placed This Encounter  Procedures  . DG Ribs Unilateral W/Chest Left    Arville CareJoshua Dettinger, MD [facility] 09/26/2017, 12:39 PM

## 2017-10-02 ENCOUNTER — Encounter: Payer: Self-pay | Admitting: Family Medicine

## 2017-10-08 ENCOUNTER — Encounter: Payer: Self-pay | Admitting: *Deleted

## 2017-10-08 ENCOUNTER — Ambulatory Visit (INDEPENDENT_AMBULATORY_CARE_PROVIDER_SITE_OTHER): Payer: PPO | Admitting: *Deleted

## 2017-10-08 VITALS — BP 186/83 | HR 86 | Ht 65.0 in | Wt 164.0 lb

## 2017-10-08 DIAGNOSIS — F0280 Dementia in other diseases classified elsewhere without behavioral disturbance: Secondary | ICD-10-CM | POA: Insufficient documentation

## 2017-10-08 DIAGNOSIS — Z0000 Encounter for general adult medical examination without abnormal findings: Secondary | ICD-10-CM

## 2017-10-08 DIAGNOSIS — F22 Delusional disorders: Secondary | ICD-10-CM

## 2017-10-08 DIAGNOSIS — R3129 Other microscopic hematuria: Secondary | ICD-10-CM

## 2017-10-08 DIAGNOSIS — G309 Alzheimer's disease, unspecified: Secondary | ICD-10-CM | POA: Insufficient documentation

## 2017-10-08 LAB — URINALYSIS, ROUTINE W REFLEX MICROSCOPIC
BILIRUBIN UA: NEGATIVE
Glucose, UA: NEGATIVE
Ketones, UA: NEGATIVE
Leukocytes, UA: NEGATIVE
Nitrite, UA: NEGATIVE
PH UA: 7 (ref 5.0–7.5)
Protein, UA: NEGATIVE
Specific Gravity, UA: 1.015 (ref 1.005–1.030)
Urobilinogen, Ur: 0.2 mg/dL (ref 0.2–1.0)

## 2017-10-08 MED ORDER — FUROSEMIDE 20 MG PO TABS: 20.0000 mg | ORAL_TABLET | Freq: Every day | ORAL | 0 refills | Status: DC | PRN

## 2017-10-08 NOTE — Patient Instructions (Signed)
  [name] , Thank you for taking time to come for your Medicare Wellness Visit. I appreciate your ongoing commitment to your health goals. Please review the following plan we discussed and let me know if I can assist you in the future.   These are the goals we discussed: Goals    . Exercise 150 min/wk Moderate Activity     Chair exercises daily. Handout given.        This is a list of the screening recommended for you and due dates:  Health Maintenance  Topic Date Due  . Flu Shot  11/19/2017*  . Tetanus Vaccine  11/19/2017*  . Pneumonia vaccines (1 of 2 - PCV13) 11/19/2017*  *Topic was postponed. The date shown is not the original due date.   Move carefully to avoid falls

## 2017-10-09 NOTE — Progress Notes (Signed)
Unsuccessfully attempted to contact patient's daughter, Tamera Puntmiranda on number provided.  Will try again tomorrow.

## 2017-10-09 NOTE — Progress Notes (Signed)
I saw this patient yesterday for an AWV and his daughter was concerned that he had hematuria at his last visit. I thought he may have bruised his kidney when he fell and that would account for the microscopic hematuria at that time. He is asymptomatic his daughter is just concerned about other potential causes. I ordered a U/A with micro yesterday but only the U/A was resulted. The dip did show 1+ blood again. Would you like to follow up with possible a urology referral? What's your thoughts?

## 2017-10-15 NOTE — Progress Notes (Signed)
Subjective:   Dan Perez is a 84 y.o. male who presents for an Initial Medicare Annual Wellness Visit. Patient is divorced and lives in a one story home by himself. He has neighbors that visit and his children visit often and take him out to eat. He is retired and spent about 8 years in the [employer]. He is able to read/write and speak [place].   Review of Systems  Health is about the same as last year  Cardiac Risk Factors include: male gender;sedentary lifestyle;advanced age (>79men, >2 women)    Objective:    Today's Vitals   10/08/17 1247  BP: (!) 186/83  Pulse: 86  Weight: 164 lb (74.4 kg)  Height: 5\' 5"  (1.651 m)   Body mass index is 27.29 kg/m.  Advanced Directives 12/03/2016 11/30/2014 12/17/2013 12/14/2013  Does Patient Have a Medical Advance Directive? Yes Yes Patient has advance directive, copy not in chart Patient has advance directive, copy not in chart  Type of Advance Directive Healthcare Power of [place];Living will Living will;Healthcare Power of [employer] Power of [employer] Power of Attorney  Does patient want to make changes to medical advance directive? - No - Patient declined No change requested No change requested  Copy of Healthcare Power of Attorney in Chart? - No - copy requested - -  Pre-existing out of facility DNR order (yellow form or pink MOST form) - - - No    Current Medications (verified) Outpatient Encounter Medications as of 10/08/2017  Medication Sig  . furosemide (LASIX) 20 MG tablet Take 1 tablet (20 mg total) by mouth daily as needed for edema.  Marland Kitchen levothyroxine (LEVOTHROID) 25 MCG tablet Take 25 mcg by mouth 2 (two) times a week.   Marland Kitchen lisinopril (PRINIVIL,ZESTRIL) 5 MG tablet Take 1 tablet (5 mg total) by mouth daily.  . naproxen sodium (ANAPROX) 220 MG tablet Take 220 mg by mouth daily.  . [DISCONTINUED] furosemide (LASIX) 20 MG tablet Take 20 mg by mouth daily as needed for edema.    No facility-administered  encounter medications on file as of 10/08/2017.     Allergies (verified) Patient has no known allergies.   History: Past Medical History:  Diagnosis Date  . AAA (abdominal aortic aneurysm) without rupture (HCC)    [place] (11/03/2013): 3.9 x 3.3 cm  . Chronic abdominal pain   . Delusional disorder (HCC)    chronic  . Essential hypertension, benign   . History of goiter   . Paranoia (HCC)   . Right inguinal hernia    to see Dr. Malvin Johns for repair   Past Surgical History:  Procedure Laterality Date  . CATARACT EXTRACTION, BILATERAL    . EYE SURGERY Left 2004   Cataract   . EYE SURGERY Right 2004   Cataract   . INGUINAL HERNIA REPAIR Right 12/17/2013   Procedure: HERNIA REPAIR INGUINAL ADULT;  Surgeon: Marlane Hatcher, MD;  Location: [facility];  Service: General;  Laterality: Right;  . THYROIDECTOMY  03/02/2011   Left lobectomy and isthmusectomy -    Family History  Problem Relation Age of Onset  . Heart attack Father   . Thyroid cancer Father   . Dementia Mother   . Stroke Mother   . Diabetes Mother   . Varicose Veins Mother   . Thyroid cancer Mother   . Alzheimer's disease Mother   . Diabetes Brother   . Diabetes Daughter   . Hyperlipidemia Daughter   . Hypertension Daughter   .  Varicose Veins Daughter   . Hyperlipidemia Son   . Hypertension Son   . Hyperlipidemia Daughter   . Alzheimer's disease Sister   . Cancer Brother        thyroid  . Heart attack Brother    Social History   Socioeconomic History  . Marital status: Divorced    Spouse name: None  . Number of children: 5  . Years of education: 2713  . Highest education level: Associate degree: occupational, Scientist, product/process developmenttechnical, or vocational program  Social Needs  . Financial resource strain: Not very hard  . Food insecurity - worry: Never true  . Food insecurity - inability: Never true  . Transportation needs - medical: No  . Transportation needs - non-medical: No  Occupational History  . None  Tobacco Use  .  Smoking status: Former Smoker    Last attempt to quit: 08/12/1983    Years since quitting: 34.2  . Smokeless tobacco: Never Used  Substance and Sexual Activity  . Alcohol use: No  . Drug use: No  . Sexual activity: Not Currently  Other Topics Concern  . None  Social History Narrative   Patient is divorced and lives in a one story home by himself. He has neighbors that visit and his children visit often and take him out to eat. He is retired and spent about 8 years in the [employer]. He is able to read/write and speak [place].    Tobacco Counseling No tobacco use  Clinical Intake:     Pain : No/denies pain     Nutritional Status: BMI of 19-24  Normal Diabetes: No  How often do you need to have someone help you when you read instructions, pamphlets, or other written materials from your doctor or pharmacy?: 1 - Never What is the last grade level you completed in school?: 13  Interpreter Needed?: No  Information entered by :: Demetrios LollKristen Jamaris Theard, RN  Activities of Daily Living In your present state of health, do you have any difficulty performing the following activities: 10/08/2017  Hearing? N  Vision? N  Difficulty concentrating or making decisions? Y  Comment Daughter says that some days he has some difficulty and then other days he is very clear  Walking or climbing stairs? N  Dressing or bathing? N  Doing errands, shopping? N  Preparing Food and eating ? N  Using the Toilet? N  In the past six months, have you accidently leaked urine? N  Do you have problems with loss of bowel control? N  Managing your Medications? N  Comment Daughters put his medicine in a pill box  Managing your Finances? N  Housekeeping or managing your Housekeeping? N  Some recent data might be hidden     Immunizations and Health Maintenance  There is no immunization history on file for this patient. There are no preventive care reminders to display for this patient.  Patient Care  Team: Dettinger, Elige RadonJoshua A, MD as PCP - General (Family Medicine) Jonelle SidleMcDowell, Samuel G, MD as Consulting Physician (Cardiology)  No hopspitalizations, ER vists, or surgeries this past year.     Assessment:   This is a routine wellness examination for [employer].  Hearing/Vision screen No deficits noted during visit  Dietary issues and exercise activities discussed: Current Exercise Habits: Home exercise routine, Type of exercise: stretching, Time (Minutes): 10, Frequency (Times/Week): 3, Weekly Exercise (Minutes/Week): 30, Intensity: Mild, Exercise limited by: orthopedic condition(s)   Diet Eats out most meals and has snacks at home  Goals    . Exercise 150 min/wk Moderate Activity     Chair exercises daily. Handout given.       Depression Screen PHQ 2/9 Scores 10/08/2017 05/22/2017 04/22/2017  PHQ - 2 Score 0 0 0    Fall Risk Fall Risk  10/08/2017 09/26/2017 05/22/2017 04/22/2017  Falls in the past year? Yes Yes No No  Comment Got up to go into the kitchen and his foot was planted twhen he turned and he fell.  - - -  Number falls in past yr: 1 1 - -  Injury with Fall? Yes Yes - -  Comment - left rib pain - -  Risk for fall due to : History of fall(s);Impaired balance/gait - - -  Follow up Falls prevention discussed - - -    Is the patient's home free of loose throw rugs in walkways, pet beds, electrical cords, etc?   yes      Grab bars in the bathroom? no      Handrails on the stairs?   yes      Adequate lighting?   yes  Cognitive Function: Unable to complete MMSE accurately. MiniCog was 4/5. Clock was drawn appropriately.      Screening Tests Health Maintenance  Topic Date Due  . INFLUENZA VACCINE  11/19/2017 (Originally 04/17/2017)  . TETANUS/TDAP  11/19/2017 (Originally 07/29/1951)  . PNA vac Low Risk Adult (1 of 2 - PCV13) 11/19/2017 (Originally 07/30/1999)      Plan:  Rck urine for hematuria per daughter's request Chair exercises daily. Handout given. Keep f/u with  PCP Keep scheduled appt with eye doctor  I have personally reviewed and noted the following in the patient's chart:   . Medical and social history . Use of alcohol, tobacco or illicit drugs  . Current medications and supplements . Functional ability and status . Nutritional status . Physical activity . Advanced directives . List of other physicians . Hospitalizations, surgeries, and ER visits in previous 12 months . Vitals . Screenings to include cognitive, depression, and falls . Referrals and appointments  In addition, I have reviewed and discussed with patient certain preventive protocols, quality metrics, and best practice recommendations. A written personalized care plan for preventive services as well as general preventive health recommendations were provided to patient.     Demetrios Loll, RN   10/08/2017     I have reviewed and agree with the above AWV documentation.   Jannifer Rodney, FNP

## 2017-10-16 ENCOUNTER — Encounter: Payer: Self-pay | Admitting: *Deleted

## 2018-01-01 DIAGNOSIS — H524 Presbyopia: Secondary | ICD-10-CM | POA: Diagnosis not present

## 2018-01-01 DIAGNOSIS — Z961 Presence of intraocular lens: Secondary | ICD-10-CM | POA: Diagnosis not present

## 2018-01-01 DIAGNOSIS — H52223 Regular astigmatism, bilateral: Secondary | ICD-10-CM | POA: Diagnosis not present

## 2018-01-09 DIAGNOSIS — J40 Bronchitis, not specified as acute or chronic: Secondary | ICD-10-CM | POA: Diagnosis not present

## 2018-01-09 DIAGNOSIS — R05 Cough: Secondary | ICD-10-CM | POA: Diagnosis not present

## 2018-01-09 DIAGNOSIS — M545 Low back pain: Secondary | ICD-10-CM | POA: Diagnosis not present

## 2018-01-09 DIAGNOSIS — R0789 Other chest pain: Secondary | ICD-10-CM | POA: Diagnosis not present

## 2018-01-14 ENCOUNTER — Ambulatory Visit (INDEPENDENT_AMBULATORY_CARE_PROVIDER_SITE_OTHER): Payer: PPO | Admitting: Family Medicine

## 2018-01-14 ENCOUNTER — Encounter: Payer: Self-pay | Admitting: Family Medicine

## 2018-01-14 VITALS — BP 125/65 | HR 80 | Temp 97.8°F | Ht 65.0 in | Wt 168.4 lb

## 2018-01-14 DIAGNOSIS — R011 Cardiac murmur, unspecified: Secondary | ICD-10-CM | POA: Diagnosis not present

## 2018-01-14 DIAGNOSIS — I714 Abdominal aortic aneurysm, without rupture, unspecified: Secondary | ICD-10-CM

## 2018-01-14 DIAGNOSIS — R0789 Other chest pain: Secondary | ICD-10-CM | POA: Diagnosis not present

## 2018-01-14 DIAGNOSIS — F22 Delusional disorders: Secondary | ICD-10-CM | POA: Diagnosis not present

## 2018-01-14 MED ORDER — PREDNISONE 10 MG PO TABS: ORAL_TABLET | ORAL | 0 refills | Status: DC

## 2018-01-14 MED ORDER — BETAMETHASONE SOD PHOS & ACET 6 (3-3) MG/ML IJ SUSP
6.0000 mg | Freq: Once | INTRAMUSCULAR | Status: AC
  Administered 2018-01-14: 6 mg via INTRAMUSCULAR

## 2018-01-14 NOTE — Progress Notes (Signed)
Subjective:  Patient ID: Dan Perez, male    DOB: 1932-07-03  Age: 84 y.o. MRN: [id number]  CC: constant pain in right chest   HPI Dan Perez presents for continued right-sided chest pain.  This is apparently radiating from the mid back.  The pain is moderately severe.  He was seen in urgent care and had a chest x-ray which was read as nonacute.  The pain has not been associated with a cough.  It is not radiating to the left chest.  There is no shortness of breath.  No fever chills or sweats.  Onset was approximately a week ago he went to urgent care on 01/09/2018 and was given a Z-Pak and a 5 to 6-day prednisone taper.  He was also given a Toradol shot.  The symptoms got better for a couple of days but returned promptly and have comes in annually gotten worse since that time.  Depression screen [facility] 2/9 01/14/2018 10/08/2017 05/22/2017  Decreased Interest 0 0 0  Down, Depressed, Hopeless 0 0 0  PHQ - 2 Score 0 0 0    History Dan Perez has a past medical history of AAA (abdominal aortic aneurysm) without rupture (HCC), Chronic abdominal pain, Delusional disorder (HCC), Essential hypertension, benign, History of goiter, Paranoia (HCC), and Right inguinal hernia.   He has a past surgical history that includes Thyroidectomy (03/02/2011); Cataract extraction, bilateral; Inguinal hernia repair (Right, 12/17/2013); Eye surgery (Left, 2004); and Eye surgery (Right, 2004).   His family history includes Alzheimer's disease in his mother and sister; Cancer in his brother; Dementia in his mother; Diabetes in his brother, daughter, and mother; Heart attack in his brother and father; Hyperlipidemia in his daughter, daughter, and son; Hypertension in his daughter and son; Stroke in his mother; Thyroid cancer in his father and mother; Varicose Veins in his daughter and mother.He reports that he quit smoking about 34 years ago. He has never used smokeless tobacco. He reports that he does not drink alcohol  or use drugs.    ROS Review of Systems  Constitutional: Negative for fever.  Respiratory: Negative for shortness of breath.   Cardiovascular: Positive for chest pain (Atypical radiating from the right mid thoracic back to the right chest and a band but there is no rash it does cross the midline in the back).  Musculoskeletal: Negative for arthralgias.  Skin: Negative for rash.    Objective:  BP 125/65   Pulse 80   Temp 97.8 F (36.6 C) (Oral)   Ht  (1.651 m)   Wt 168 lb 6.4 oz (76.4 kg)   BMI 28.02 kg/m   BP Readings from Last 3 Encounters:  01/14/18 125/65  10/08/17 (!) 186/83  09/26/17 (!) 160/82    Wt Readings from Last 3 Encounters:  01/14/18 168 lb 6.4 oz (76.4 kg)  10/08/17 164 lb (74.4 kg)  09/26/17 158 lb (71.7 kg)     Physical Exam  Constitutional: He appears well-developed and well-nourished.  HENT:  Head: Normocephalic and atraumatic.  Right Ear: Tympanic membrane and external ear normal. No decreased hearing is noted.  Left Ear: Tympanic membrane and external ear normal. No decreased hearing is noted.  Mouth/Throat: No oropharyngeal exudate or posterior oropharyngeal erythema.  Eyes: Pupils are equal, round, and reactive to light.  Neck: Normal range of motion. Neck supple.  Cardiovascular: Normal rate and regular rhythm.  Murmur heard.  Crescendo systolic murmur is present with a grade of 2/6. Pulmonary/Chest: Breath sounds normal. No  respiratory distress.  Abdominal: Soft. Bowel sounds are normal. He exhibits no mass. There is no tenderness.  Musculoskeletal: He exhibits tenderness (There is mild to moderate tenderness to percussion of the spine at approximately T9-11..  There is moderate dorsal kyphosis.).  Vitals reviewed.     Assessment & Plan:   Dan Perez was seen today for constant pain in right chest.  Diagnoses and all orders for this visit:  Right-sided chest wall pain -     betamethasone acetate-betamethasone sodium phosphate  (CELESTONE) injection 6 mg  Systolic murmur  Delusional disorder (HCC)  AAA (abdominal aortic aneurysm) without rupture (HCC)  Other orders -     predniSONE (DELTASONE) 10 MG tablet; Take 5 daily for 2 days followed by 4,3,2 and 1 for 2 days each.       I have discontinued Dan Perez's metformin. I am also having him start on predniSONE. Additionally, I am having him maintain his levothyroxine, lisinopril, naproxen sodium, furosemide, and loratadine. We administered betamethasone acetate-betamethasone sodium phosphate.  Allergies as of 01/14/2018   No Known Allergies     Medication List        Accurate as of 01/14/18  8:03 PM. Always use your most recent med list.          furosemide 20 MG tablet Commonly known as:  LASIX Take 1 tablet (20 mg total) by mouth daily as needed for edema.   LEVOTHROID 25 MCG tablet Generic drug:  levothyroxine Take 25 mcg by mouth 2 (two) times a week.   lisinopril 5 MG tablet Commonly known as:  PRINIVIL,ZESTRIL Take 1 tablet (5 mg total) by mouth daily.   loratadine 10 MG tablet Commonly known as:  CLARITIN Take by mouth.   naproxen sodium 220 MG tablet Commonly known as:  ALEVE Take 220 mg by mouth daily.   predniSONE 10 MG tablet Commonly known as:  DELTASONE Take 5 daily for 2 days followed by 4,3,2 and 1 for 2 days each.        Follow-up: Return in about 2 weeks (around 01/28/2018).  Mechele Claude, M.D.

## 2018-01-17 ENCOUNTER — Other Ambulatory Visit: Payer: Self-pay

## 2018-01-17 MED ORDER — LISINOPRIL 5 MG PO TABS: 5.0000 mg | ORAL_TABLET | Freq: Every day | ORAL | 1 refills | Status: DC

## 2018-01-30 ENCOUNTER — Emergency Department (HOSPITAL_COMMUNITY): Payer: PPO

## 2018-01-30 ENCOUNTER — Other Ambulatory Visit: Payer: Self-pay

## 2018-01-30 ENCOUNTER — Emergency Department (HOSPITAL_COMMUNITY)
Admission: EM | Admit: 2018-01-30 | Discharge: 2018-01-30 | Disposition: A | Discharge status: Home / Self Care | Payer: PPO | Attending: Emergency Medicine | Admitting: Emergency Medicine

## 2018-01-30 ENCOUNTER — Encounter (HOSPITAL_COMMUNITY): Payer: Self-pay | Admitting: Emergency Medicine

## 2018-01-30 DIAGNOSIS — R0789 Other chest pain: Secondary | ICD-10-CM | POA: Diagnosis not present

## 2018-01-30 DIAGNOSIS — R1011 Right upper quadrant pain: Secondary | ICD-10-CM | POA: Diagnosis not present

## 2018-01-30 DIAGNOSIS — I714 Abdominal aortic aneurysm, without rupture: Secondary | ICD-10-CM | POA: Diagnosis not present

## 2018-01-30 DIAGNOSIS — Z87891 Personal history of nicotine dependence: Secondary | ICD-10-CM | POA: Diagnosis not present

## 2018-01-30 DIAGNOSIS — Z79899 Other long term (current) drug therapy: Secondary | ICD-10-CM | POA: Diagnosis not present

## 2018-01-30 DIAGNOSIS — E039 Hypothyroidism, unspecified: Secondary | ICD-10-CM | POA: Insufficient documentation

## 2018-01-30 DIAGNOSIS — R109 Unspecified abdominal pain: Secondary | ICD-10-CM | POA: Diagnosis not present

## 2018-01-30 DIAGNOSIS — I10 Essential (primary) hypertension: Secondary | ICD-10-CM | POA: Insufficient documentation

## 2018-01-30 LAB — COMPREHENSIVE METABOLIC PANEL
ALBUMIN: 3.8 g/dL (ref 3.5–5.0)
ALT: 21 U/L (ref 17–63)
ANION GAP: 7 (ref 5–15)
AST: 24 U/L (ref 15–41)
Alkaline Phosphatase: 67 U/L (ref 38–126)
BUN: 19 mg/dL (ref 6–20)
CALCIUM: 8.9 mg/dL (ref 8.9–10.3)
CHLORIDE: 103 mmol/L (ref 101–111)
CO2: 29 mmol/L (ref 22–32)
Creatinine, Ser: 1.07 mg/dL (ref 0.61–1.24)
GFR calc non Af Amer: >60 mL/min (ref >60)
Glucose, Bld: 89 mg/dL (ref 65–99)
Potassium: 4.4 mmol/L (ref 3.5–5.1)
SODIUM: 139 mmol/L (ref 135–145)
Total Bilirubin: 0.9 mg/dL (ref 0.3–1.2)
Total Protein: 7.2 g/dL (ref 6.5–8.1)

## 2018-01-30 LAB — CBC WITH DIFFERENTIAL/PLATELET
BASOS PCT: 1 %
Basophils Absolute: 0.1 10*3/uL (ref 0.0–0.1)
EOS ABS: 0.3 10*3/uL (ref 0.0–0.7)
Eosinophils Relative: 4 %
HCT: 38.4 % — ABNORMAL LOW (ref 39.0–52.0)
Hemoglobin: 11.9 g/dL — ABNORMAL LOW (ref 13.0–17.0)
LYMPHS ABS: 1.8 10*3/uL (ref 0.7–4.0)
Lymphocytes Relative: 25 %
MCH: 28.9 pg (ref 26.0–34.0)
MCHC: 31 g/dL (ref 30.0–36.0)
MCV: 93.2 fL (ref 78.0–100.0)
MONOS PCT: 12 %
Monocytes Absolute: 0.9 10*3/uL (ref 0.1–1.0)
NEUTROS ABS: 4.2 10*3/uL (ref 1.7–7.7)
NEUTROS PCT: 58 %
PLATELETS: 231 10*3/uL (ref 150–400)
RBC: 4.12 MIL/uL — ABNORMAL LOW (ref 4.22–5.81)
RDW: 14 % (ref 11.5–15.5)
WBC: 7.2 10*3/uL (ref 4.0–10.5)

## 2018-01-30 LAB — URINALYSIS, ROUTINE W REFLEX MICROSCOPIC
BILIRUBIN URINE: NEGATIVE
Bacteria, UA: NONE SEEN
Glucose, UA: NEGATIVE mg/dL
KETONES UR: NEGATIVE mg/dL
LEUKOCYTES UA: NEGATIVE
NITRITE: NEGATIVE
PROTEIN: NEGATIVE mg/dL
Specific Gravity, Urine: 1.003 — ABNORMAL LOW (ref 1.005–1.030)
pH: 6 (ref 5.0–8.0)

## 2018-01-30 MED ORDER — MELOXICAM 7.5 MG PO TABS: 7.5000 mg | ORAL_TABLET | Freq: Every day | ORAL | 0 refills | Status: DC

## 2018-01-30 MED ORDER — KETOROLAC TROMETHAMINE 30 MG/ML IJ SOLN
15.0000 mg | Freq: Once | INTRAMUSCULAR | Status: AC
  Administered 2018-01-30: 15 mg via INTRAVENOUS
  Filled 2018-01-30: qty 1

## 2018-01-30 MED ORDER — IOPAMIDOL (ISOVUE-370) INJECTION 76%
100.0000 mL | Freq: Once | INTRAVENOUS | Status: AC | PRN
  Administered 2018-01-30: 100 mL via INTRAVENOUS

## 2018-01-30 MED ORDER — ACETAMINOPHEN 325 MG PO TABS
650.0000 mg | ORAL_TABLET | Freq: Once | ORAL | Status: AC
  Administered 2018-01-30: 650 mg via ORAL
  Filled 2018-01-30: qty 2

## 2018-01-30 NOTE — ED Provider Notes (Signed)
Emergency Department Provider Note   I have reviewed the triage vital signs and the nursing notes.   HISTORY  Chief Complaint Flank Pain   HPI Dan Perez is a 84 y.o. male with a distant past smoking history of approximately 50 years, multiple medical problems documented below the presents emergency department today secondary to persistent right-sided chest pain.  Patient was initially seen in urgent care on April 25 was diagnosed with bronchitis was given Toradol shot, prednisone and azithromycin he states his symptoms improved for a couple days but then came back she went saw his primary doctor on April 30.  They started steroids again and the patient had relief again for about a week but then the last couple days the pain is come back.  He states that sharp is in his right chest radiates from his right back.  Is not associate with cough, fever, shortness of breath, diarrhea, constipation.  States before the last few weeks he never anything like this before.  No trauma. No other associated or modifying symptoms.    Past Medical History:  Diagnosis Date  . AAA (abdominal aortic aneurysm) without rupture (HCC)    [place] (11/03/2013): 3.9 x 3.3 cm  . Chronic abdominal pain   . Delusional disorder (HCC)    chronic  . Essential hypertension, benign   . History of goiter   . Paranoia (HCC)   . Right inguinal hernia    to see Dr. Malvin Johns for repair    Patient Active Problem List   Diagnosis Date Noted  . 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 Hypothyroid 04/22/2017  . Inguinal hernia unilateral, non-recurrent 12/17/2013  . AAA (abdominal aortic aneurysm) without rupture (HCC) 11/24/2013  . Essential hypertension, benign 10/23/2013  . Cardiac murmur 10/23/2013  . Mild neurocognitive disorder due to Alzheimer's disease 07/25/2013  . Venous stasis dermatitis 07/25/2013  . Delusional disorder (HCC) 07/16/2013  . Substernal thyroid goiter 03/27/2011      Past Surgical History:  Procedure Laterality Date  . CATARACT EXTRACTION, BILATERAL    . EYE SURGERY Left 2004   Cataract   . EYE SURGERY Right 2004   Cataract   . INGUINAL HERNIA REPAIR Right 12/17/2013   Procedure: HERNIA REPAIR INGUINAL ADULT;  Surgeon: Marlane Hatcher, MD;  Location: [facility];  Service: General;  Laterality: Right;  . THYROIDECTOMY  03/02/2011   Left lobectomy and isthmusectomy -     Current Outpatient Rx  . Order #: [id number] Class: Normal  . Order #: [id number] Class: Historical Med  . Order #: [id number] Class: Normal  . Order #: [id number] Class: Historical Med  . Order #: [id number] Class: Print  . Order #: [id number] Class: Historical Med  . Order #: [id number] Class: Normal    Allergies Patient has no known allergies.  Family History  Problem Relation Age of Onset  . Heart attack Father   . Thyroid cancer Father   . Dementia Mother   . Stroke Mother   . Diabetes Mother   . Varicose Veins Mother   . Thyroid cancer Mother   . Alzheimer's disease Mother   . Diabetes Brother   . Diabetes Daughter   . Hyperlipidemia Daughter   . Hypertension Daughter   . Varicose Veins Daughter   . Hyperlipidemia Son   . Hypertension Son   . Hyperlipidemia Daughter   . Alzheimer's disease Sister   . Cancer Brother        thyroid  . Heart attack Brother  Social History Social History   Tobacco Use  . Smoking status: Former Smoker    Last attempt to quit: 08/12/1983    Years since quitting: 34.4  . Smokeless tobacco: Never Used  Substance Use Topics  . Alcohol use: No  . Drug use: No    Review of Systems  All other systems negative except as documented in the HPI. All pertinent positives and negatives as reviewed in the HPI. ____________________________________________   PHYSICAL EXAM:  VITAL SIGNS: ED Triage Vitals  Enc Vitals Group     BP 01/30/18 1032 (!) 184/93     Pulse Rate 01/30/18 1032 67     Resp 01/30/18 1032 18     Temp 01/30/18  1032 (!) 97.5 F (36.4 C)     Temp Source 01/30/18 1032 Oral     SpO2 01/30/18 1032 99 %     Weight 01/30/18 1032 155 lb (70.3 kg)     Height 01/30/18 1032  (1.651 m)     Head Circumference --      Peak Flow --      Pain Score 01/30/18 1033 5     Pain Loc --      Pain Edu? --      Excl. in GC? --     Constitutional: Alert and oriented. Well appearing and in no acute distress. Eyes: Conjunctivae are normal. PERRL. EOMI. Head: Atraumatic. Nose: No congestion/rhinnorhea. Mouth/Throat: Mucous membranes are moist.  Oropharynx non-erythematous. Neck: No stridor.  No meningeal signs.   Cardiovascular: Normal rate, regular rhythm. Good peripheral circulation. 4/6 heart murmur.   Respiratory: Normal respiratory effort.  No retractions. Lungs with wheezing in RLL. Gastrointestinal: Soft and nontender. No distention.  Musculoskeletal: No lower extremity tenderness nor edema. No gross deformities of extremities. Neurologic:  Normal speech and language. No gross focal neurologic deficits are appreciated.  Skin:  Skin is warm, dry and intact. No rash noted.   ____________________________________________   LABS (all labs ordered are listed, but only abnormal results are displayed)  Labs Reviewed  URINALYSIS, ROUTINE W REFLEX MICROSCOPIC - Abnormal; Notable for the following components:      Result Value   Color, Urine STRAW (*)    Specific Gravity, Urine 1.003 (*)    Hgb urine dipstick MODERATE (*)    All other components within normal limits  CBC WITH DIFFERENTIAL/PLATELET - Abnormal; Notable for the following components:   RBC 4.12 (*)    Hemoglobin 11.9 (*)    HCT 38.4 (*)    All other components within normal limits  COMPREHENSIVE METABOLIC PANEL   ____________________________________________  EKG   EKG Interpretation  Date/Time:  Thursday Jan 30 2018 15:23:27 EDT Ventricular Rate:  79 PR Interval:    QRS Duration: 117 QT Interval:  439 QTC Calculation: 467 R  Axis:   -39 Text Interpretation:  Sinus rhythm Ventricular premature complex Nonspecific IVCD with LAD Inferior infarct, old Probable anteroseptal infarct, old Baseline wander in lead(s) V4 poor baseline, given limitation no obvious STEMI or other acute problem Confirmed by Marily Memos [phone]) on 01/30/2018 3:46:58 PM       ____________________________________________  RADIOLOGY  Ct Angio Chest/abd/pel For Dissection W And/or Wo Contrast  Result Date: 01/30/2018 CLINICAL DATA:  Right-sided flank pain EXAM: CT ANGIOGRAPHY CHEST, ABDOMEN AND PELVIS TECHNIQUE: Multidetector CT imaging through the chest, abdomen and pelvis was performed using the standard protocol during bolus administration of intravenous contrast. Multiplanar reconstructed images and MIPs were obtained and reviewed to evaluate the  vascular anatomy. CONTRAST:  ISOVUE-370 COMPARISON:  CT of the abdomen dated 12/03/2016, CT of the chest dated 11/28/2010. FINDINGS: CTA CHEST FINDINGS Cardiovascular: Thoracic aorta demonstrates atherosclerotic calcifications without aneurysmal dilatation. Significant tortuosity of the thoracic aorta is noted. Some mildly ulcerated soft atherosclerotic plaque is noted in the descending thoracic aorta. The pulmonary artery is incompletely evaluated on this exam although no large central pulmonary embolus is seen. No cardiac enlargement is seen. Coronary calcifications are noted. Mediastinum/Nodes: The thoracic inlet is within normal limits. Postsurgical changes in the left neck are seen consistent with prior surgery of the thyroid gland. The esophagus is within normal limits. No hilar or mediastinal adenopathy is noted. Lungs/Pleura: Mild atelectatic changes are noted in the bases bilaterally. No focal confluent infiltrate is seen. No sizable parenchymal nodule is noted. Musculoskeletal: Changes consistent with pectus excavatum are noted. Multilevel degenerative changes of the cervical and thoracic spine  are noted. Compression deformity at T8 is noted which is new from a prior exam of 2012 but appears chronic in nature. Correlation to point tenderness is recommended. Mild scoliosis is noted. No definitive rib abnormality is noted. Review of the MIP images confirms the above findings. CTA ABDOMEN AND PELVIS FINDINGS VASCULAR Aorta: Abdominal aorta demonstrates significant tortuosity as well as mild dilatation of the abdominal aorta to 3.9 cm. This has increased slightly in the interval from the prior exam at which time it measured 3.5 cm. Tapering is noted at the iliac bifurcation. Celiac: Patent without evidence of aneurysm, dissection, vasculitis or significant stenosis. SMA: Patent without evidence of aneurysm, dissection, vasculitis or significant stenosis. Renals: Mild atherosclerotic changes are noted in the renal arteries. Single renal arteries are identified bilaterally without focal stenosis. IMA: Patent without evidence of aneurysm, dissection, vasculitis or significant stenosis. Iliacs: The iliac arteries demonstrate atherosclerotic calcifications without aneurysmal dilatation. Veins: No specific vein abnormality is noted. Review of the MIP images confirms the above findings. NON-VASCULAR Hepatobiliary: No focal liver abnormality is seen. No gallstones, gallbladder wall thickening, or biliary dilatation. Pancreas: Unremarkable. No pancreatic ductal dilatation or surrounding inflammatory changes. Spleen: Normal in size without focal abnormality. Adrenals/Urinary Tract: Adrenal glands are within normal limits. Kidneys are well visualized bilaterally. No renal calculi are noted. No obstructive changes are seen. The bladder is partially distended. Stomach/Bowel: Fecal material is noted throughout the colon without obstructive change. No obstructive or inflammatory changes are noted. Mild diverticular change is seen. Small bowel is within normal limits. The appendix is not well visualized although no  inflammatory changes are seen. Lymphatic: No significant lymphadenopathy is noted. Reproductive: Prostate is unremarkable. Other: No abdominal wall hernia or abnormality. No abdominopelvic ascites. Musculoskeletal: Degenerative changes of the lumbar spine are noted. Scoliosis is noted concave to the right. Chronic compression deformity of L4 is seen. Review of the MIP images confirms the above findings. IMPRESSION: No evidence of aortic dissection. Mild aneurysmal dilatation of the abdominal aorta. Recommend followup by ultrasound in 2 years. This recommendation follows ACR consensus guidelines: White Paper of the ACR Incidental Findings Committee II on Vascular Findings. J Am Coll Radiol 2013; 10:789-794. T8 and L4 compression deformities which appear chronic in nature. Diffuse atherosclerotic changes without acute abnormality. Mild diverticular change of the colon without diverticulitis. Electronically Signed   By: Alcide Clever M.D.   On: 01/30/2018 19:30    ____________________________________________   PROCEDURES  Procedure(s) performed:   Procedures   ____________________________________________   INITIAL IMPRESSION / ASSESSMENT AND PLAN / ED COURSE  Workup unremarkable. No evidence  of kidney stone, cancer, lung mass, pulmonary embolus or aortic dissection.  No evidence of bronchitis or pneumonia.  Unclear the etiology of his symptoms however I do not think it is an emergent cause at this time he can follow-up with his primary doctor.  Blood pressure is high but likely because he did not take his medication yet today.     Pertinent labs & imaging results that were available during my care of the patient were reviewed by me and considered in my medical decision making (see chart for details).  ____________________________________________  FINAL CLINICAL IMPRESSION(S) / ED DIAGNOSES  Final diagnoses:  Flank pain     MEDICATIONS GIVEN DURING THIS VISIT:  Medications  ketorolac  (TORADOL) 30 MG/ML injection 15 mg (15 mg Intravenous Given 01/30/18 1638)  iopamidol (ISOVUE-370) 76 % injection 100 mL (100 mLs Intravenous Contrast Given 01/30/18 1844)  acetaminophen (TYLENOL) tablet 650 mg (650 mg Oral Given 01/30/18 2029)     NEW OUTPATIENT MEDICATIONS STARTED DURING THIS VISIT:  Discharge Medication List as of 01/30/2018  9:04 PM    START taking these medications   Details  meloxicam (MOBIC) 7.5 MG tablet Take 1 tablet (7.5 mg total) by mouth daily., Starting Thu 01/30/2018, Print        Note:  This note was prepared with assistance of Dragon voice recognition software. Occasional wrong-word or sound-a-like substitutions may have occurred due to the inherent limitations of voice recognition software.   Eeshan Verbrugge, Barbara Cower, MD 01/31/18 [phone]

## 2018-01-30 NOTE — ED Triage Notes (Signed)
Pt reports intermittent R sided flank pain around to rib. Denies injury, hematuria, dysuria, or increased pain with breathing.

## 2018-02-03 ENCOUNTER — Ambulatory Visit (INDEPENDENT_AMBULATORY_CARE_PROVIDER_SITE_OTHER): Payer: PPO | Admitting: Family Medicine

## 2018-02-03 ENCOUNTER — Encounter: Payer: Self-pay | Admitting: Family Medicine

## 2018-02-03 VITALS — BP 142/83 | HR 79 | Temp 97.4°F | Ht 65.0 in | Wt 162.0 lb

## 2018-02-03 DIAGNOSIS — J309 Allergic rhinitis, unspecified: Secondary | ICD-10-CM | POA: Diagnosis not present

## 2018-02-03 DIAGNOSIS — R0789 Other chest pain: Secondary | ICD-10-CM

## 2018-02-03 MED ORDER — PREDNISONE 20 MG PO TABS: ORAL_TABLET | ORAL | 0 refills | Status: DC

## 2018-02-03 MED ORDER — FLUTICASONE PROPIONATE 50 MCG/ACT NA SUSP: 1.0000 | Freq: Two times a day (BID) | NASAL | 6 refills | Status: DC | PRN

## 2018-02-03 MED ORDER — LORATADINE 10 MG PO TABS
10.0000 mg | ORAL_TABLET | Freq: Every day | ORAL | 11 refills | Status: DC
Start: 2018-02-03 — End: 2019-02-16

## 2018-02-03 NOTE — Progress Notes (Signed)
BP (!) 142/83   Pulse 79   Temp (!) 97.4 F (36.3 C) (Oral)   Ht  (1.651 m)   Wt 162 lb (73.5 kg)   BMI 26.96 kg/m    Subjective:    Patient ID: Dan Perez, male    DOB: 1931-10-09, 84 y.o.   MRN: [id number]  HPI: ECHO ALLSBROOK is a 84 y.o. male presenting on 02/03/2018 for ER follow up and Throat is scratchy   HPI Sinus congestion and allergies and scratchy throat Patient has been having sinus congestion and scratchy throat and allergies and sinus drainage.  Patient does complain of some left and right lower rib pain but worse on the left side.  He does have significant pectus excavatum that strains on his muscles and has been coughing a lot.  He says the pain does hurt when he coughs or takes deep breaths.  He denies any fevers or chills or shortness of breath or wheezing  Relevant past medical, surgical, family and social history reviewed and updated as indicated. Interim medical history since our last visit reviewed. Allergies and medications reviewed and updated.  Review of Systems  Constitutional: Negative for chills and fever.  HENT: Positive for congestion, postnasal drip, rhinorrhea, sinus pressure, sneezing and sore throat. Negative for ear discharge, ear pain and voice change.   Eyes: Negative for pain, discharge, redness and visual disturbance.  Respiratory: Positive for cough. Negative for shortness of breath and wheezing.   Cardiovascular: Positive for chest pain. Negative for leg swelling.  Musculoskeletal: Negative for gait problem.  Skin: Negative for rash.  All other systems reviewed and are negative.   Per HPI unless specifically indicated above   Allergies as of 02/03/2018   No Known Allergies     Medication List        Accurate as of 02/03/18 12:04 PM. Always use your most recent med list.          fluticasone 50 MCG/ACT nasal spray Commonly known as:  FLONASE Place 1 spray into both nostrils 2 (two) times daily as needed for  allergies or rhinitis.   furosemide 20 MG tablet Commonly known as:  LASIX Take 1 tablet (20 mg total) by mouth daily as needed for edema.   LEVOTHROID 25 MCG tablet Generic drug:  levothyroxine Take 25 mcg by mouth 2 (two) times a week.   lisinopril 5 MG tablet Commonly known as:  PRINIVIL,ZESTRIL Take 1 tablet (5 mg total) by mouth daily.   loratadine 10 MG tablet Commonly known as:  CLARITIN Take 1 tablet (10 mg total) by mouth daily.   meloxicam 7.5 MG tablet Commonly known as:  MOBIC Take 1 tablet (7.5 mg total) by mouth daily.   naproxen sodium 220 MG tablet Commonly known as:  ALEVE Take 220 mg by mouth daily.          Objective:    BP (!) 142/83   Pulse 79   Temp (!) 97.4 F (36.3 C) (Oral)   Ht  (1.651 m)   Wt 162 lb (73.5 kg)   BMI 26.96 kg/m   Wt Readings from Last 3 Encounters:  02/03/18 162 lb (73.5 kg)  01/30/18 155 lb (70.3 kg)  01/14/18 168 lb 6.4 oz (76.4 kg)    Physical Exam  Constitutional: He is oriented to person, place, and time. He appears well-developed and well-nourished. No distress.  HENT:  Right Ear: Tympanic membrane, external ear and ear canal normal.  Left Ear:  Tympanic membrane, external ear and ear canal normal.  Nose: Mucosal edema and rhinorrhea present. No sinus tenderness. No epistaxis. Right sinus exhibits maxillary sinus tenderness. Right sinus exhibits no frontal sinus tenderness. Left sinus exhibits maxillary sinus tenderness. Left sinus exhibits no frontal sinus tenderness.  Mouth/Throat: Uvula is midline and mucous membranes are normal. Posterior oropharyngeal edema and posterior oropharyngeal erythema present. No oropharyngeal exudate or tonsillar abscesses.  Eyes: Conjunctivae are normal. No scleral icterus.  Neck: Neck supple. No thyromegaly present.  Cardiovascular: Normal rate, regular rhythm, normal heart sounds and intact distal pulses.  No murmur heard. Pulmonary/Chest: Effort normal and breath sounds  normal. No respiratory distress. He has no wheezes. He has no rales. He exhibits tenderness (Lower rib tenderness along chest wall both on right and left lower ribs).  Musculoskeletal: Normal range of motion. He exhibits no edema.  Lymphadenopathy:    He has no cervical adenopathy.  Neurological: He is alert and oriented to person, place, and time. Coordination normal.  Skin: Skin is warm and dry. No rash noted. He is not diaphoretic.  Psychiatric: He has a normal mood and affect. His behavior is normal.  Nursing note and vitals reviewed.      Assessment & Plan:   Problem List Items Addressed This Visit    None    Visit Diagnoses    Allergic sinusitis    -  Primary   Relevant Medications   loratadine (CLARITIN) 10 MG tablet   fluticasone (FLONASE) 50 MCG/ACT nasal spray   predniSONE (DELTASONE) 20 MG tablet   Chest wall discomfort       Likely related to allergies, will get allergies under control and see if it improves.  Was seen in ED and had CT and ruled out heart, recommend meloxicam    Relevant Medications   predniSONE (DELTASONE) 20 MG tablet      Will try and control his allergies and get him to stop coughing and see if that will improve his chest wall pain in his throat and is sinuses.  Follow up plan: Return if symptoms worsen or fail to improve.  Counseling provided for all of the vaccine components No orders of the defined types were placed in this encounter.   Arville Care, MD [facility] 02/03/2018, 12:04 PM

## 2018-02-11 ENCOUNTER — Emergency Department (HOSPITAL_COMMUNITY): Payer: PPO

## 2018-02-11 ENCOUNTER — Emergency Department (HOSPITAL_COMMUNITY)
Admission: EM | Admit: 2018-02-11 | Discharge: 2018-02-11 | Disposition: A | Discharge status: Home / Self Care | Payer: PPO | Attending: Emergency Medicine | Admitting: Emergency Medicine

## 2018-02-11 ENCOUNTER — Encounter (HOSPITAL_COMMUNITY): Payer: Self-pay | Admitting: *Deleted

## 2018-02-11 DIAGNOSIS — R52 Pain, unspecified: Secondary | ICD-10-CM

## 2018-02-11 DIAGNOSIS — F0280 Dementia in other diseases classified elsewhere without behavioral disturbance: Secondary | ICD-10-CM | POA: Insufficient documentation

## 2018-02-11 DIAGNOSIS — Z87891 Personal history of nicotine dependence: Secondary | ICD-10-CM | POA: Diagnosis not present

## 2018-02-11 DIAGNOSIS — I10 Essential (primary) hypertension: Secondary | ICD-10-CM | POA: Diagnosis not present

## 2018-02-11 DIAGNOSIS — G309 Alzheimer's disease, unspecified: Secondary | ICD-10-CM | POA: Diagnosis not present

## 2018-02-11 DIAGNOSIS — M546 Pain in thoracic spine: Secondary | ICD-10-CM | POA: Insufficient documentation

## 2018-02-11 DIAGNOSIS — E039 Hypothyroidism, unspecified: Secondary | ICD-10-CM | POA: Insufficient documentation

## 2018-02-11 DIAGNOSIS — Z79899 Other long term (current) drug therapy: Secondary | ICD-10-CM | POA: Insufficient documentation

## 2018-02-11 DIAGNOSIS — S22060A Wedge compression fracture of T7-T8 vertebra, initial encounter for closed fracture: Secondary | ICD-10-CM | POA: Diagnosis not present

## 2018-02-11 DIAGNOSIS — M549 Dorsalgia, unspecified: Secondary | ICD-10-CM | POA: Diagnosis present

## 2018-02-11 MED ORDER — ACETAMINOPHEN 500 MG PO TABS
1000.0000 mg | ORAL_TABLET | Freq: Once | ORAL | Status: AC
  Administered 2018-02-11: 1000 mg via ORAL
  Filled 2018-02-11: qty 2

## 2018-02-11 NOTE — Discharge Instructions (Addendum)
Your x-ray shows arthritis changes present.  The fracture in your thoracic spine has not changed.  Your lungs remain clear.  Your oxygen level is 98%.  Please use Tylenol extra strength every 4 hours as needed for pain or discomfort.  Please see Dr. Louanne Skye or return to the emergency department if any changes in your condition, problems, or concerns.

## 2018-02-11 NOTE — ED Provider Notes (Signed)
[facility] EMERGENCY DEPARTMENT Provider Note   CSN: [id number] Arrival date & time: 02/11/18  1306     History   Chief Complaint Chief Complaint  Patient presents with  . Back Pain    HPI Dan Perez is a 84 y.o. male.  Patient is an 84 year old male who presents to the emergency department with upper back pain.  Patient has a history of degenerative joint disease, kyphosis, and an abdominal aortic aneurysm.  The patient states that he has been having some slight increase in pain on the right between his shoulder blades.  This is getting worse over the past 2 days.  He wanted to be evaluated concerning this.  The patient denies being short of breath, and he denies cough.  There is been no new nausea, or vomiting reported.  No recent falls or injury reported at this time.  No other injury reported at this time.  The history is provided by the patient.  Back Pain   Pertinent negatives include no chest pain, no abdominal pain and no dysuria.    Past Medical History:  Diagnosis Date  . AAA (abdominal aortic aneurysm) without rupture (HCC)    [place] (11/03/2013): 3.9 x 3.3 cm  . Chronic abdominal pain   . Delusional disorder (HCC)    chronic  . Essential hypertension, benign   . History of goiter   . Paranoia (HCC)   . Right inguinal hernia    to see Dr. Malvin Johns for repair    Patient Active Problem List   Diagnosis Date Noted  . 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 Hypothyroid 04/22/2017  . Inguinal hernia unilateral, non-recurrent 12/17/2013  . AAA (abdominal aortic aneurysm) without rupture (HCC) 11/24/2013  . Essential hypertension, benign 10/23/2013  . Cardiac murmur 10/23/2013  . Mild neurocognitive disorder due to Alzheimer's disease 07/25/2013  . Venous stasis dermatitis 07/25/2013  . Delusional disorder (HCC) 07/16/2013  . Substernal thyroid goiter 03/27/2011    Past Surgical History:  Procedure Laterality Date  . CATARACT  EXTRACTION, BILATERAL    . EYE SURGERY Left 2004   Cataract   . EYE SURGERY Right 2004   Cataract   . INGUINAL HERNIA REPAIR Right 12/17/2013   Procedure: HERNIA REPAIR INGUINAL ADULT;  Surgeon: Marlane Hatcher, MD;  Location: [facility];  Service: General;  Laterality: Right;  . THYROIDECTOMY  03/02/2011   Left lobectomy and isthmusectomy -         Home Medications    Prior to Admission medications   Medication Sig Start Date End Date Taking? Authorizing Provider  fluticasone (FLONASE) 50 MCG/ACT nasal spray Place 1 spray into both nostrils 2 (two) times daily as needed for allergies or rhinitis. 02/03/18   Dettinger, Elige Radon, MD  furosemide (LASIX) 20 MG tablet Take 1 tablet (20 mg total) by mouth daily as needed for edema. 10/08/17   Junie Spencer, FNP  levothyroxine (LEVOTHROID) 25 MCG tablet Take 25 mcg by mouth 2 (two) times a week.     [provider]  lisinopril (PRINIVIL,ZESTRIL) 5 MG tablet Take 1 tablet (5 mg total) by mouth daily. 01/17/18   Mechele Claude, MD  loratadine (CLARITIN) 10 MG tablet Take 1 tablet (10 mg total) by mouth daily. 02/03/18   Dettinger, Elige Radon, MD  meloxicam (MOBIC) 7.5 MG tablet Take 1 tablet (7.5 mg total) by mouth daily. 01/30/18   Mesner, Barbara Cower, MD  naproxen sodium (ANAPROX) 220 MG tablet Take 220 mg by  mouth daily.    [provider]  predniSONE (DELTASONE) 20 MG tablet Take 3 tabs daily for 1 week, then 2 tabs daily for week 2, then 1 tab daily for week 3. 02/03/18   Dettinger, Elige Radon, MD    Family History Family History  Problem Relation Age of Onset  . Heart attack Father   . Thyroid cancer Father   . Dementia Mother   . Stroke Mother   . Diabetes Mother   . Varicose Veins Mother   . Thyroid cancer Mother   . Alzheimer's disease Mother   . Diabetes Brother   . Diabetes Daughter   . Hyperlipidemia Daughter   . Hypertension Daughter   . Varicose Veins Daughter   . Hyperlipidemia Son   . Hypertension Son   .  Hyperlipidemia Daughter   . Alzheimer's disease Sister   . Cancer Brother        thyroid  . Heart attack Brother     Social History Social History   Tobacco Use  . Smoking status: Former Smoker    Last attempt to quit: 08/12/1983    Years since quitting: 34.5  . Smokeless tobacco: Never Used  Substance Use Topics  . Alcohol use: No  . Drug use: No     Allergies   Patient has no known allergies.   Review of Systems Review of Systems  Constitutional: Negative for activity change.       All ROS Neg except as noted in HPI  HENT: Negative for nosebleeds.   Eyes: Negative for photophobia and discharge.  Respiratory: Negative for cough, shortness of breath and wheezing.   Cardiovascular: Negative for chest pain and palpitations.  Gastrointestinal: Negative for abdominal pain and blood in stool.  Genitourinary: Negative for dysuria, frequency and hematuria.  Musculoskeletal: Positive for arthralgias and back pain. Negative for neck pain.  Skin: Negative.   Neurological: Negative for dizziness, seizures and speech difficulty.  Psychiatric/Behavioral: Negative for confusion and hallucinations.       Paranoia     Physical Exam Updated Vital Signs BP (!) 153/107 (BP Location: Right Arm)   Pulse 95   Temp (!) 97.4 F (36.3 C) (Oral)   Resp 17   SpO2 98%   Physical Exam  Constitutional: He is oriented to person, place, and time. He appears well-developed and well-nourished.  Non-toxic appearance.  HENT:  Head: Normocephalic.  Right Ear: Tympanic membrane and external ear normal.  Left Ear: Tympanic membrane and external ear normal.  Eyes: Pupils are equal, round, and reactive to light. EOM and lids are normal.  Neck: Normal range of motion. Neck supple. Carotid bruit is not present.  Cardiovascular: Normal rate, regular rhythm, intact distal pulses and normal pulses.  Murmur heard.  Systolic murmur is present with a grade of 2/6. Pulmonary/Chest: Breath sounds normal.  No respiratory distress.  Abdominal: Soft. Bowel sounds are normal. There is no tenderness. There is no guarding.  There is no mass or pulsating mass noted in the abdomen.  Musculoskeletal: Normal range of motion.  There is a kyphosis present of the upper back area.  There is some tenderness to percussion of the thoracic area mostly in the area of T7, T8, T9.  Some swelling of the lower extremities.  There is noted stasis dermatitis of the lower extremities.  Lymphadenopathy:       Head (right side): No submandibular adenopathy present.       Head (left side): No submandibular adenopathy present.  He has no cervical adenopathy.  Neurological: He is alert and oriented to person, place, and time. He has normal strength. No cranial nerve deficit or sensory deficit.  Patient is ambulatory with minimal problem.  Skin: Skin is warm and dry.  Psychiatric: He has a normal mood and affect. His speech is normal.  Nursing note and vitals reviewed.    ED Treatments / Results  Labs (all labs ordered are listed, but only abnormal results are displayed) Labs Reviewed - No data to display  EKG None  Radiology Dg Thoracic Spine W/swimmers  Result Date: 02/11/2018 CLINICAL DATA:  Pain between shoulder blades for 1 week. Exaggerated kyphosis and scoliosis. EXAM: THORACIC SPINE - 3 VIEWS COMPARISON:  CT chest 01/30/2018. [facility] chest x-ray and left rib radiographs 09/26/2017. FINDINGS: The heart size is normal. Elevated left hemidiaphragm is again noted. Atherosclerotic changes are present at the aortic arch. Exaggerated thoracic kyphosis is again noted. No focal airspace disease is present. There is no edema or effusion. Remote vertebral plana compression fracture at T8 is stable. IMPRESSION: 1. No acute cardiopulmonary disease. 2. Similar appearance of exaggerated thoracic kyphosis and scoliosis. 3. Vertebral plana compression fracture at T8 is stable. Electronically Signed   By: Marin Roberts M.D.    On: 02/11/2018 14:01    Procedures Procedures (including critical care time)  Medications Ordered in ED Medications  acetaminophen (TYLENOL) tablet 1,000 mg (has no administration in time range)     Initial Impression / Assessment and Plan / ED Course  I have reviewed the triage vital signs and the nursing notes.  Pertinent labs & imaging results that were available during my care of the patient were reviewed by me and considered in my medical decision making (see chart for details).       Final Clinical Impressions(s) / ED Diagnoses  MDM  Blood pressure is elevated at 153/107.  The vital signs are otherwise within normal limits.  The patient has a long-term kyphosis present.  The x-ray shows that the compression fracture at the T8 area remained stable.  The lungs are clear.  I have reassured the patient of the findings on the examination as well as the findings on the x-ray.  Questions were answered.  I have asked the patient use Tylenol extra strength for soreness, and to follow-up with Dr. Louanne Skye if any changes in his condition or problems.  The patient will return to the emergency department if any complications or problems.   Final diagnoses:  Acute right-sided thoracic back pain    ED Discharge Orders    None       Ivery Quale, PA-C 02/11/18 1542    Samuel Jester, DO 02/16/18 [phone]

## 2018-02-11 NOTE — ED Triage Notes (Signed)
Pt with pain to mid upper and toward right upper back between shoulder blades off and on for past 2 days. Denies sob, denies cough. Denies N/V/D.

## 2018-02-12 ENCOUNTER — Other Ambulatory Visit (HOSPITAL_COMMUNITY): Admit: 2018-02-12 | Payer: PPO

## 2018-02-18 ENCOUNTER — Other Ambulatory Visit: Payer: Self-pay | Admitting: Family

## 2018-03-06 ENCOUNTER — Encounter: Payer: Self-pay | Admitting: Family Medicine

## 2018-03-07 ENCOUNTER — Other Ambulatory Visit: Payer: Self-pay | Admitting: Family Medicine

## 2018-03-21 ENCOUNTER — Encounter: Payer: Self-pay | Admitting: Family Medicine

## 2018-03-21 ENCOUNTER — Ambulatory Visit (INDEPENDENT_AMBULATORY_CARE_PROVIDER_SITE_OTHER): Payer: PPO | Admitting: Family Medicine

## 2018-03-21 VITALS — BP 99/55 | HR 85 | Temp 97.0°F | Ht 65.0 in | Wt 150.0 lb

## 2018-03-21 DIAGNOSIS — G301 Alzheimer's disease with late onset: Secondary | ICD-10-CM | POA: Diagnosis not present

## 2018-03-21 DIAGNOSIS — F0280 Dementia in other diseases classified elsewhere without behavioral disturbance: Secondary | ICD-10-CM | POA: Diagnosis not present

## 2018-03-21 LAB — MICROSCOPIC EXAMINATION
Bacteria, UA: NONE SEEN
EPITHELIAL CELLS (NON RENAL): NONE SEEN /HPF (ref 0–10)
RENAL EPITHEL UA: NONE SEEN /HPF
WBC UA: NONE SEEN /HPF (ref 0–5)

## 2018-03-21 LAB — URINALYSIS, COMPLETE
Bilirubin, UA: NEGATIVE
Glucose, UA: NEGATIVE
Ketones, UA: NEGATIVE
LEUKOCYTES UA: NEGATIVE
Nitrite, UA: NEGATIVE
PH UA: 5.5 (ref 5.0–7.5)
Protein, UA: NEGATIVE
Specific Gravity, UA: 1.02 (ref 1.005–1.030)
Urobilinogen, Ur: 0.2 mg/dL (ref 0.2–1.0)

## 2018-03-21 MED ORDER — MEMANTINE HCL-DONEPEZIL HCL 28-10 MG PO CP24
1.0000 | ORAL_CAPSULE | Freq: Every day | ORAL | 1 refills | Status: DC
Start: 2018-03-21 — End: 2018-08-25

## 2018-03-21 NOTE — Progress Notes (Signed)
BP (!) 99/55   Pulse 85   Temp (!) 97 F (36.1 C) (Oral)   Ht 5\' 5"  (1.651 m)   Wt 150 lb (68 kg)   BMI 24.96 kg/m    Subjective:    Patient ID: Dan Perez, male    DOB: 1931-11-26, 84 y.o.   MRN: [id number]  HPI: Dan Perez is a 84 y.o. male presenting on 03/21/2018 for Memory Loss (Daughter states that it has gotten worse.)   HPI Worsening memory loss  patient is coming in today with worsening memory issues.  Daughter says that he has been Increasingly having difficulty remembering things and especially there names and other things that he normally remembers over the past 4 weeks.  She says he has had UTIs in the past and wants to check for that also wants to have his memory tested.  She denies him complaining of any urinary tract symptoms currently.  She says his blood pressure is down from where it has been.  He is on the lisinopril because of protection for his aortic aneurysm.  He denies any abdominal pain or flank pain.  Relevant past medical, surgical, family and social history reviewed and updated as indicated. Interim medical history since our last visit reviewed. Allergies and medications reviewed and updated.  Review of Systems  Constitutional: Negative for chills and fever.  Respiratory: Negative for shortness of breath and wheezing.   Cardiovascular: Negative for chest pain and leg swelling.  Gastrointestinal: Negative for abdominal pain.  Genitourinary: Negative for dysuria, frequency and urgency.  Musculoskeletal: Negative for back pain and gait problem.  Skin: Negative for rash.  Psychiatric/Behavioral: Positive for confusion.  All other systems reviewed and are negative.   Per HPI unless specifically indicated above   Allergies as of 03/21/2018   No Known Allergies     Medication List        Accurate as of 03/21/18  2:57 PM. Always use your most recent med list.          furosemide 20 MG tablet Commonly known as:  LASIX TAKE 1 TABLET  BY MOUTH ONCE DAILY AS NEEDED FOR  EDEMA   lisinopril 5 MG tablet Commonly known as:  PRINIVIL,ZESTRIL Take 1 tablet (5 mg total) by mouth daily.   loratadine 10 MG tablet Commonly known as:  CLARITIN Take 1 tablet (10 mg total) by mouth daily.   naproxen sodium 220 MG tablet Commonly known as:  ALEVE Take 220 mg by mouth daily.          Objective:    BP (!) 99/55   Pulse 85   Temp (!) 97 F (36.1 C) (Oral)   Ht 5\' 5"  (1.651 m)   Wt 150 lb (68 kg)   BMI 24.96 kg/m   Wt Readings from Last 3 Encounters:  03/21/18 150 lb (68 kg)  02/03/18 162 lb (73.5 kg)  01/30/18 155 lb (70.3 kg)    Physical Exam  Constitutional: He is oriented to person, place, and time. He appears well-developed and well-nourished. No distress.  Eyes: Conjunctivae are normal. No scleral icterus.  Neck: Neck supple. No thyromegaly present.  Cardiovascular: Normal rate, regular rhythm, normal heart sounds and intact distal pulses.  No murmur heard. Pulmonary/Chest: Effort normal and breath sounds normal. No respiratory distress. He has no wheezes.  Abdominal: Soft. Bowel sounds are normal. He exhibits no distension and no mass. There is no tenderness. There is no guarding.  Musculoskeletal: Normal range of motion.  He exhibits no edema.  Lymphadenopathy:    He has no cervical adenopathy.  Neurological: He is alert and oriented to person, place, and time. Coordination normal.  Skin: Skin is warm and dry. No rash noted. He is not diaphoretic.  Psychiatric: He has a normal mood and affect. His behavior is normal.  Nursing note and vitals reviewed.   MMSE - Mini Mental State Exam 03/21/2018  Orientation to time 3  Orientation to Place 5  Registration 3  Attention/ Calculation 3  Recall 0  Language- name 2 objects 2  Language- repeat 1  Language- follow 3 step command 3  Language- read & follow direction 1  Write a sentence 1  Copy design 0  Total score 22   Urinalysis: Await patient to leave  urine    Assessment & Plan:   Problem List Items Addressed This Visit    None    Visit Diagnoses    Late onset Alzheimer's disease without behavioral disturbance    -  Primary   Relevant Medications   Memantine HCl-Donepezil HCl (NAMZARIC) 28-10 MG CP24   Other Relevant Orders   Urinalysis, Complete   Urine Culture    Gave samples of a starting pack for Namzaric  Follow up plan: Return in about 3 months (around 06/21/2018), or if symptoms worsen or fail to improve, for Follow-up memory.  Counseling provided for all of the vaccine components No orders of the defined types were placed in this encounter.   Arville Care, MD [facility] 03/21/2018, 2:57 PM

## 2018-03-23 LAB — URINE CULTURE

## 2018-03-24 ENCOUNTER — Encounter: Payer: Self-pay | Admitting: Family Medicine

## 2018-05-28 ENCOUNTER — Other Ambulatory Visit: Payer: PPO

## 2018-06-02 ENCOUNTER — Ambulatory Visit: Payer: PPO | Admitting: Cardiovascular Disease

## 2018-06-03 ENCOUNTER — Encounter: Payer: Self-pay | Admitting: Cardiovascular Disease

## 2018-06-10 ENCOUNTER — Telehealth: Payer: Self-pay | Admitting: Cardiovascular Disease

## 2018-06-10 NOTE — Telephone Encounter (Signed)
°  Precert needed for: ECHO   Location: [facility]    Date: Jul 03, 2018

## 2018-07-03 ENCOUNTER — Other Ambulatory Visit: Payer: PPO

## 2018-07-03 ENCOUNTER — Other Ambulatory Visit: Payer: Self-pay

## 2018-07-03 ENCOUNTER — Ambulatory Visit (INDEPENDENT_AMBULATORY_CARE_PROVIDER_SITE_OTHER): Payer: PPO

## 2018-07-03 DIAGNOSIS — I350 Nonrheumatic aortic (valve) stenosis: Secondary | ICD-10-CM

## 2018-07-08 ENCOUNTER — Telehealth: Payer: Self-pay | Admitting: Cardiovascular Disease

## 2018-07-08 DIAGNOSIS — I350 Nonrheumatic aortic (valve) stenosis: Secondary | ICD-10-CM

## 2018-07-08 DIAGNOSIS — R011 Cardiac murmur, unspecified: Secondary | ICD-10-CM

## 2018-07-08 NOTE — Telephone Encounter (Signed)
Returned call

## 2018-07-09 NOTE — Telephone Encounter (Signed)
-----   Message from Albertine Patricia, CMA sent at 07/03/2018 12:59 PM EDT -----   ----- Message ----- From: Laqueta Linden, MD Sent: 07/03/2018  12:43 PM EDT To: Albertine Patricia, CMA  This study demonstrates: Normal pumping function, significant calcium buildup on aortic valve leading to moderate to severe narrowing, moderate mitral valve leakage. Medication changes / Follow up studies / Other recommendations:   Repeat echocardiogram in 6 to 12 months. Please send results to the PCP:  Dettinger, Elige Radon, MD  Prentice Docker, MD 07/03/2018 12:42 PM

## 2018-07-09 NOTE — Telephone Encounter (Signed)
Patient's daughter Tamera Punt informed. Copy sent to PCP.

## 2018-07-15 ENCOUNTER — Emergency Department (HOSPITAL_COMMUNITY): Payer: PPO

## 2018-07-15 ENCOUNTER — Encounter (HOSPITAL_COMMUNITY): Payer: Self-pay | Admitting: Emergency Medicine

## 2018-07-15 ENCOUNTER — Other Ambulatory Visit: Payer: Self-pay

## 2018-07-15 ENCOUNTER — Emergency Department (HOSPITAL_COMMUNITY)
Admission: EM | Admit: 2018-07-15 | Discharge: 2018-07-15 | Disposition: A | Discharge status: Home / Self Care | Payer: PPO | Attending: Emergency Medicine | Admitting: Emergency Medicine

## 2018-07-15 DIAGNOSIS — I10 Essential (primary) hypertension: Secondary | ICD-10-CM | POA: Diagnosis not present

## 2018-07-15 DIAGNOSIS — E039 Hypothyroidism, unspecified: Secondary | ICD-10-CM | POA: Insufficient documentation

## 2018-07-15 DIAGNOSIS — R0789 Other chest pain: Secondary | ICD-10-CM | POA: Diagnosis not present

## 2018-07-15 DIAGNOSIS — Z79899 Other long term (current) drug therapy: Secondary | ICD-10-CM | POA: Diagnosis not present

## 2018-07-15 DIAGNOSIS — G309 Alzheimer's disease, unspecified: Secondary | ICD-10-CM | POA: Diagnosis not present

## 2018-07-15 DIAGNOSIS — R079 Chest pain, unspecified: Secondary | ICD-10-CM | POA: Diagnosis not present

## 2018-07-15 DIAGNOSIS — Z87891 Personal history of nicotine dependence: Secondary | ICD-10-CM | POA: Diagnosis not present

## 2018-07-15 LAB — COMPREHENSIVE METABOLIC PANEL
ALK PHOS: 56 U/L (ref 38–126)
ALT: 14 U/L (ref 0–44)
ANION GAP: 9 (ref 5–15)
AST: 25 U/L (ref 15–41)
Albumin: 3.9 g/dL (ref 3.5–5.0)
BILIRUBIN TOTAL: 0.8 mg/dL (ref 0.3–1.2)
BUN: 22 mg/dL (ref 8–23)
CALCIUM: 8.6 mg/dL — AB (ref 8.9–10.3)
CO2: 28 mmol/L (ref 22–32)
Chloride: 101 mmol/L (ref 98–111)
Creatinine, Ser: 1.19 mg/dL (ref 0.61–1.24)
GFR calc non Af Amer: 55 mL/min — ABNORMAL LOW (ref >60)
Glucose, Bld: 92 mg/dL (ref 70–99)
Potassium: 4.1 mmol/L (ref 3.5–5.1)
SODIUM: 138 mmol/L (ref 135–145)
TOTAL PROTEIN: 7.2 g/dL (ref 6.5–8.1)

## 2018-07-15 LAB — CBC WITH DIFFERENTIAL/PLATELET
Abs Immature Granulocytes: 0.02 10*3/uL (ref 0.00–0.07)
BASOS PCT: 0 %
Basophils Absolute: 0 10*3/uL (ref 0.0–0.1)
EOS ABS: 0.1 10*3/uL (ref 0.0–0.5)
EOS PCT: 2 %
HCT: 37.9 % — ABNORMAL LOW (ref 39.0–52.0)
Hemoglobin: 11.6 g/dL — ABNORMAL LOW (ref 13.0–17.0)
Immature Granulocytes: 0 %
Lymphocytes Relative: 14 %
Lymphs Abs: 1.1 10*3/uL (ref 0.7–4.0)
MCH: 29.7 pg (ref 26.0–34.0)
MCHC: 30.6 g/dL (ref 30.0–36.0)
MCV: 96.9 fL (ref 80.0–100.0)
MONO ABS: 0.8 10*3/uL (ref 0.1–1.0)
MONOS PCT: 10 %
Neutro Abs: 5.4 10*3/uL (ref 1.7–7.7)
Neutrophils Relative %: 74 %
Platelets: 206 10*3/uL (ref 150–400)
RBC: 3.91 MIL/uL — ABNORMAL LOW (ref 4.22–5.81)
RDW: 13.2 % (ref 11.5–15.5)
WBC: 7.4 10*3/uL (ref 4.0–10.5)
nRBC: 0 % (ref 0.0–0.2)

## 2018-07-15 LAB — TROPONIN I

## 2018-07-15 MED ORDER — TRAMADOL HCL 50 MG PO TABS: 50.0000 mg | ORAL_TABLET | Freq: Four times a day (QID) | ORAL | 0 refills | Status: DC | PRN

## 2018-07-15 NOTE — ED Notes (Signed)
Patient given discharge instruction, verbalized understand. IV removed, band aid applied. Patient ambulatory out of the department.  

## 2018-07-15 NOTE — ED Provider Notes (Signed)
[facility] EMERGENCY DEPARTMENT Provider Note   CSN: [id number] Arrival date & time: 07/15/18  0845     History   Chief Complaint Chief Complaint  Patient presents with  . Chest Pain    HPI Dan Perez is a 84 y.o. male.  Patient complains of left-sided chest discomfort.  Patient states it hurts with movement.  This is been going on for a number days  The history is provided by the patient. No language interpreter was used.  Chest Pain   This is a new problem. The current episode started more than 2 days ago. The problem occurs constantly. The problem has not changed since onset.The pain is associated with movement. The pain is present in the lateral region. The pain is at a severity of 6/10. The pain is moderate. The quality of the pain is described as sharp. The pain does not radiate. Pertinent negatives include no abdominal pain, no back pain, no cough and no headaches.  Pertinent negatives for past medical history include no seizures.    Past Medical History:  Diagnosis Date  . AAA (abdominal aortic aneurysm) without rupture (HCC)    [place] (11/03/2013): 3.9 x 3.3 cm  . Chronic abdominal pain   . Delusional disorder (HCC)    chronic  . Essential hypertension, benign   . History of goiter   . Paranoia (HCC)   . Right inguinal hernia    to see Dr. Malvin Johns for repair    Patient Active Problem List   Diagnosis Date Noted  . 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 Hypothyroid 04/22/2017  . Inguinal hernia unilateral, non-recurrent 12/17/2013  . AAA (abdominal aortic aneurysm) without rupture (HCC) 11/24/2013  . Essential hypertension, benign 10/23/2013  . Cardiac murmur 10/23/2013  . Mild neurocognitive disorder due to Alzheimer's disease (HCC) 07/25/2013  . Venous stasis dermatitis 07/25/2013  . Delusional disorder (HCC) 07/16/2013  . Substernal thyroid goiter 03/27/2011    Past Surgical History:  Procedure Laterality Date  .  CATARACT EXTRACTION, BILATERAL    . EYE SURGERY Left 2004   Cataract   . EYE SURGERY Right 2004   Cataract   . INGUINAL HERNIA REPAIR Right 12/17/2013   Procedure: HERNIA REPAIR INGUINAL ADULT;  Surgeon: Marlane Hatcher, MD;  Location: [facility];  Service: General;  Laterality: Right;  . THYROIDECTOMY  03/02/2011   Left lobectomy and isthmusectomy -         Home Medications    Prior to Admission medications   Medication Sig Start Date End Date Taking? Authorizing Provider  furosemide (LASIX) 20 MG tablet TAKE 1 TABLET BY MOUTH ONCE DAILY AS NEEDED FOR  EDEMA 03/07/18  Yes Dettinger, Elige Radon, MD  lisinopril (PRINIVIL,ZESTRIL) 5 MG tablet Take 1 tablet (5 mg total) by mouth daily. 01/17/18  Yes Stacks, Broadus John, MD  naproxen sodium (ANAPROX) 220 MG tablet Take 220 mg by mouth daily.   Yes [provider]  loratadine (CLARITIN) 10 MG tablet Take 1 tablet (10 mg total) by mouth daily. Patient not taking: Reported on 07/15/2018 02/03/18   Dettinger, Elige Radon, MD  Memantine HCl-Donepezil HCl [facility]) 28-10 MG CP24 Take 1 capsule by mouth daily. Patient not taking: Reported on 07/15/2018 03/21/18   Dettinger, Elige Radon, MD  traMADol (ULTRAM) 50 MG tablet Take 1 tablet (50 mg total) by mouth every 6 (six) hours as needed. 07/15/18   Bethann Berkshire, MD    Family History Family History  Problem Relation Age of  Onset  . Heart attack Father   . Thyroid cancer Father   . Dementia Mother   . Stroke Mother   . Diabetes Mother   . Varicose Veins Mother   . Thyroid cancer Mother   . Alzheimer's disease Mother   . Diabetes Brother   . Diabetes Daughter   . Hyperlipidemia Daughter   . Hypertension Daughter   . Varicose Veins Daughter   . Hyperlipidemia Son   . Hypertension Son   . Hyperlipidemia Daughter   . Alzheimer's disease Sister   . Cancer Brother        thyroid  . Heart attack Brother     Social History Social History   Tobacco Use  . Smoking status: Former Smoker     Last attempt to quit: 08/12/1983    Years since quitting: 34.9  . Smokeless tobacco: Never Used  Substance Use Topics  . Alcohol use: No  . Drug use: No     Allergies   Patient has no known allergies.   Review of Systems Review of Systems  Constitutional: Negative for appetite change and fatigue.  HENT: Negative for congestion, ear discharge and sinus pressure.   Eyes: Negative for discharge.  Respiratory: Negative for cough.   Cardiovascular: Positive for chest pain.  Gastrointestinal: Negative for abdominal pain and diarrhea.  Genitourinary: Negative for frequency and hematuria.  Musculoskeletal: Negative for back pain.  Skin: Negative for rash.  Neurological: Negative for seizures and headaches.  Psychiatric/Behavioral: Negative for hallucinations.     Physical Exam Updated Vital Signs BP (!) 197/100   Pulse (!) 135   Temp 98.3 F (36.8 C) (Oral)   Resp 15   Ht 5\' 5"  (1.651 m)   Wt 68 kg   SpO2 97%   BMI 24.96 kg/m   Physical Exam  Constitutional: He is oriented to person, place, and time. He appears well-developed.  HENT:  Head: Normocephalic.  Eyes: Conjunctivae and EOM are normal. No scleral icterus.  Neck: Neck supple. No thyromegaly present.  Cardiovascular: Normal rate and regular rhythm. Exam reveals no gallop and no friction rub.  No murmur heard. Pulmonary/Chest: No stridor. He has no wheezes. He has no rales. He exhibits tenderness.  Abdominal: He exhibits no distension. There is no tenderness. There is no rebound.  Musculoskeletal: Normal range of motion. He exhibits no edema.  Lymphadenopathy:    He has no cervical adenopathy.  Neurological: He is oriented to person, place, and time. He exhibits normal muscle tone. Coordination normal.  Skin: No rash noted. No erythema.  Psychiatric: He has a normal mood and affect. His behavior is normal.     ED Treatments / Results  Labs (all labs ordered are listed, but only abnormal results are  displayed) Labs Reviewed  CBC WITH DIFFERENTIAL/PLATELET - Abnormal; Notable for the following components:      Result Value   RBC 3.91 (*)    Hemoglobin 11.6 (*)    HCT 37.9 (*)    All other components within normal limits  COMPREHENSIVE METABOLIC PANEL - Abnormal; Notable for the following components:   Calcium 8.6 (*)    GFR calc non Af Amer 55 (*)    All other components within normal limits  TROPONIN I    EKG EKG Interpretation  Date/Time:  Tuesday July 15 2018 09:14:45 EDT Ventricular Rate:  76 PR Interval:    QRS Duration: 104 QT Interval:  386 QTC Calculation: 434 R Axis:   -17 Text Interpretation:  Sinus  rhythm Atrial premature complex Borderline left axis deviation Probable anteroseptal infarct, old Confirmed by Bethann Berkshire [phone]) on 07/15/2018 10:21:52 AM   Radiology Dg Chest 2 View  Result Date: 07/15/2018 CLINICAL DATA:  Left-sided chest pain started Sunday. Hurts with activity. EXAM: CHEST - 2 VIEW COMPARISON:  02/11/2018 FINDINGS: There is no focal parenchymal opacity. There is no pleural effusion or pneumothorax. The heart and mediastinal contours are unremarkable. There there is thoracic aortic atherosclerosis. There is a thoracic spine kyphosis centered around a chronic midthoracic spine vertebral body compression fracture. IMPRESSION: No active cardiopulmonary disease. Electronically Signed   By: Hetal  Patel   On: 07/15/2018 10:45    Procedures Procedures (including critical care time)  Medications Ordered in ED Medications - No data to display   Initial Impression / Assessment and Plan / ED Course  I have reviewed the triage vital signs and the nursing notes.  Pertinent labs & imaging results that were available during my care of the patient were reviewed by me and considered in my medical decision making (see chart for details).     Labs and EKG markable.  Patient with chest wall pain he will be given Ultram will follow-up with PCP.  Also  patient has moderately elevated blood pressure.  He will have that rechecked at his doctor l Final Clinical Impressions(s) / ED Diagnoses   Final diagnoses:  Chest wall pain    ED Discharge Orders         Ordered    traMADol (ULTRAM) 50 MG tablet  Every 6 hours PRN     10 /29/19 1216           Bethann Berkshire, MD 07/15/18 1221

## 2018-07-15 NOTE — Discharge Instructions (Signed)
Follow-up with a family doctor in the next week for recheck

## 2018-07-15 NOTE — ED Triage Notes (Signed)
Onset chest pain Sunday morning,  Left sided chest pain, only hurts with activity or any movement.

## 2018-07-16 ENCOUNTER — Ambulatory Visit (INDEPENDENT_AMBULATORY_CARE_PROVIDER_SITE_OTHER): Payer: PPO | Admitting: Cardiovascular Disease

## 2018-07-16 ENCOUNTER — Encounter: Payer: Self-pay | Admitting: Cardiovascular Disease

## 2018-07-16 VITALS — BP 150/80 | HR 67 | Resp 97 | Ht 66.0 in | Wt 145.0 lb

## 2018-07-16 DIAGNOSIS — I340 Nonrheumatic mitral (valve) insufficiency: Secondary | ICD-10-CM | POA: Diagnosis not present

## 2018-07-16 DIAGNOSIS — R0789 Other chest pain: Secondary | ICD-10-CM

## 2018-07-16 DIAGNOSIS — I10 Essential (primary) hypertension: Secondary | ICD-10-CM | POA: Diagnosis not present

## 2018-07-16 DIAGNOSIS — Z9289 Personal history of other medical treatment: Secondary | ICD-10-CM

## 2018-07-16 DIAGNOSIS — I350 Nonrheumatic aortic (valve) stenosis: Secondary | ICD-10-CM | POA: Diagnosis not present

## 2018-07-16 NOTE — Patient Instructions (Signed)

## 2018-07-16 NOTE — Progress Notes (Signed)
SUBJECTIVE: The patient presents for posthospitalization follow-up.  Echocardiogram 07/03/2018 demonstrated normal left ventricular systolic function, LVEF 60 to 81%, grade 1 diastolic dysfunction, moderately calcified aortic valve leaflets with moderate to severe stenosis, and at least moderate eccentric mitral regurgitation.  He was evaluated in the ED for chest pain on 07/15/2018.  Blood pressure was markedly elevated at 197/100.  It was felt musculoskeletal in etiology and he was prescribed tramadol.  Troponins were normal.  He was mildly anemic, hemoglobin 11.6.  This has remained stable.  Chest x-ray showed no active cardiopulmonary disease.  I personally reviewed the ECG performed yesterday which demonstrated normal sinus rhythm with no ischemic ST segment or T wave abnormalities, nor any arrhythmias.  He is here with his daughter.  The patient denies exertional chest pain and shortness of breath.  He has struggled for quite some time with musculoskeletal chest wall pain.  He has significant pectus excavatum.    Social history: His daughter is a Lawyer. She sees Dr. Wyline Mood for dysautonomia. He used to work in a Medical laboratory scientific officer for 30 years.  Review of Systems: As per "subjective", otherwise negative.  No Known Allergies  Current Outpatient Medications  Medication Sig Dispense Refill  . furosemide (LASIX) 20 MG tablet TAKE 1 TABLET BY MOUTH ONCE DAILY AS NEEDED FOR  EDEMA 90 tablet 0  . lisinopril (PRINIVIL,ZESTRIL) 5 MG tablet Take 1 tablet (5 mg total) by mouth daily. 90 tablet 1  . loratadine (CLARITIN) 10 MG tablet Take 1 tablet (10 mg total) by mouth daily. 30 tablet 11  . Memantine HCl-Donepezil HCl (NAMZARIC) 28-10 MG CP24 Take 1 capsule by mouth daily. 30 capsule 1  . naproxen sodium (ANAPROX) 220 MG tablet Take 220 mg by mouth daily.    . traMADol (ULTRAM) 50 MG tablet Take 1 tablet (50 mg total) by mouth every 6 (six) hours as needed. 15 tablet 0   No current  facility-administered medications for this visit.     Past Medical History:  Diagnosis Date  . AAA (abdominal aortic aneurysm) without rupture (HCC)    [place] (11/03/2013): 3.9 x 3.3 cm  . Chronic abdominal pain   . Delusional disorder (HCC)    chronic  . Essential hypertension, benign   . History of goiter   . Paranoia (HCC)   . Right inguinal hernia    to see Dr. Malvin Johns for repair    Past Surgical History:  Procedure Laterality Date  . CATARACT EXTRACTION, BILATERAL    . EYE SURGERY Left 2004   Cataract   . EYE SURGERY Right 2004   Cataract   . INGUINAL HERNIA REPAIR Right 12/17/2013   Procedure: HERNIA REPAIR INGUINAL ADULT;  Surgeon: Marlane Hatcher, MD;  Location: [facility];  Service: General;  Laterality: Right;  . THYROIDECTOMY  03/02/2011   Left lobectomy and isthmusectomy -     Social History   Socioeconomic History  . Marital status: Divorced    Spouse name: Not on file  . Number of children: 5  . Years of education: 72  . Highest education level: Associate degree: occupational, Scientist, product/process development, or vocational program  Occupational History  . Not on file  Social Needs  . Financial resource strain: Not very hard  . Food insecurity:    Worry: Never true    Inability: Never true  . Transportation needs:    Medical: No    Non-medical: No  Tobacco Use  . Smoking status: Former Smoker  Last attempt to quit: 08/12/1983    Years since quitting: 34.9  . Smokeless tobacco: Never Used  Substance and Sexual Activity  . Alcohol use: No  . Drug use: No  . Sexual activity: Not Currently  Lifestyle  . Physical activity:    Days per week: 0 days    Minutes per session: 0 min  . Stress: Only a little  Relationships  . Social connections:    Talks on phone: More than three times a week    Gets together: More than three times a week    Attends religious service: 1 to 4 times per year    Active member of club or organization: No    Attends meetings of clubs or  organizations: Never    Relationship status: Divorced  . Intimate partner violence:    Fear of current or ex partner: No    Emotionally abused: No    Physically abused: No    Forced sexual activity: No  Other Topics Concern  . Not on file  Social History Narrative   Patient is divorced and lives in a one story home by himself. He has neighbors that visit and his children visit often and take him out to eat. He is retired and spent about 8 years in the [employer]. He is able to read/write and speak [place].      Vitals:   07/16/18 1324  BP: (!) 150/80  Pulse: 67  Resp: (!) 93  Weight: 145 lb (65.8 kg)  Height: 5\' 6"  (1.676 m)    Wt Readings from Last 3 Encounters:  07/16/18 145 lb (65.8 kg)  07/15/18 150 lb (68 kg)  03/21/18 150 lb (68 kg)     PHYSICAL EXAM General: NAD HEENT: Normal. Neck: No JVD, no thyromegaly. Lungs: Clear to auscultation bilaterally with normal respiratory effort. CV: Regular rate and rhythm, normal S1/slightly diminished S2, no S3/S4, harsh 3/6 ejection systolic murmur heard throughout precordium. No pretibial or periankle edema.  Abdomen: Soft, nontender, no distention.  Neurologic: Alert and oriented.  Psych: Normal affect. Skin: Normal. Musculoskeletal: No gross deformities.    ECG: Reviewed above under Subjective   Labs: Lab Results  Component Value Date/Time   K 4.1 07/15/2018 09:47 AM   BUN 22 07/15/2018 09:47 AM   BUN 17 04/22/2017 10:19 AM   CREATININE 1.19 07/15/2018 09:47 AM   ALT 14 07/15/2018 09:47 AM   TSH 1.420 05/22/2017 02:07 PM   HGB 11.6 (L) 07/15/2018 09:47 AM   HGB 11.6 (L) 04/22/2017 10:19 AM     Lipids: No results found for: LDLCALC, LDLDIRECT, CHOL, TRIG, HDL     ASSESSMENT AND PLAN: 1.  Aortic stenosis: Moderate to severe by echocardiogram in October 2019.  I will repeat in 6 to 12 months.  I again educated both the patient and his daughter about eventual disease progression and the need for valve  replacement (perhaps TAVR). I told him about symptoms to be mindful of such as chest pain, progressive exertional dyspnea, and syncope.  2.  Mitral regurgitation: At least moderate in severity by echocardiogram in October 2019.  I will monitor clinically and with routine surveillance echocardiography.  3.  Hypertension: Blood pressure remains elevated.  It was significantly elevated yesterday.  I will monitor and not increase antihypertensive therapy given moderate to severe aortic stenosis.  4.  Chest pain: This appeared to be musculoskeletal in etiology and likely due to significant pectus excavatum.     Disposition: Follow up 6  months.   Prentice Docker, M.D., F.A.C.C.

## 2018-07-17 ENCOUNTER — Ambulatory Visit (INDEPENDENT_AMBULATORY_CARE_PROVIDER_SITE_OTHER): Payer: PPO | Admitting: Family

## 2018-07-17 ENCOUNTER — Encounter: Payer: Self-pay | Admitting: Family

## 2018-07-17 ENCOUNTER — Other Ambulatory Visit: Payer: PPO

## 2018-07-17 VITALS — BP 144/72 | HR 71 | Temp 97.4°F | Ht 66.0 in | Wt 145.2 lb

## 2018-07-17 DIAGNOSIS — S20212A Contusion of left front wall of thorax, initial encounter: Secondary | ICD-10-CM | POA: Diagnosis not present

## 2018-07-17 MED ORDER — TRAMADOL HCL 50 MG PO TABS: 50.0000 mg | ORAL_TABLET | Freq: Four times a day (QID) | ORAL | 0 refills | Status: DC | PRN

## 2018-07-17 NOTE — Patient Instructions (Signed)
Rib Contusion A rib contusion is a deep bruise on your rib area. Contusions are the result of a blunt trauma that causes bleeding and injury to the tissues under the skin. A rib contusion may involve bruising of the ribs and of the skin and muscles in the area. The skin overlying the contusion may turn blue, purple, or yellow. Minor injuries will give you a painless contusion, but more severe contusions may stay painful and swollen for a few weeks. What are the causes? A contusion is usually caused by a blow, trauma, or direct force to an area of the body. This often occurs while playing contact sports. What are the signs or symptoms?  Swelling and redness of the injured area.  Discoloration of the injured area.  Tenderness and soreness of the injured area.  Pain with or without movement. How is this diagnosed? The diagnosis can be made by taking a medical history and performing a physical exam. An X-ray, CT scan, or MRI may be needed to determine if there were any associated injuries, such as broken bones (fractures) or internal injuries. How is this treated? Often, the best treatment for a rib contusion is rest. Icing or applying cold compresses to the injured area may help reduce swelling and inflammation. Deep breathing exercises may be recommended to reduce the risk of partial lung collapse and pneumonia. Over-the-counter or prescription medicines may also be recommended for pain control. Follow these instructions at home:  Apply ice to the injured area: ? Put ice in a plastic bag. ? Place a towel between your skin and the bag. ? Leave the ice on for 20 minutes, 2-3 times per day.  Take medicines only as directed by your health care provider.  Rest the injured area. Avoid strenuous activity and any activities or movements that cause pain. Be careful during activities and avoid bumping the injured area.  Perform deep-breathing exercises as directed by your health care provider.  Do  not lift anything that is heavier than 5 lb (2.3 kg) until your health care provider approves.  Do not use any tobacco products, including cigarettes, chewing tobacco, or electronic cigarettes. If you need help quitting, ask your health care provider. Contact a health care provider if:  You have increased bruising or swelling.  You have pain that is not controlled with treatment.  You have a fever. Get help right away if:  You have difficulty breathing or shortness of breath.  You develop a continual cough, or you cough up thick or bloody sputum.  You feel sick to your stomach (nauseous), you throw up (vomit), or you have abdominal pain. This information is not intended to replace advice given to you by your health care provider. Make sure you discuss any questions you have with your health care provider. Document Released: 05/29/2001 Document Revised: 02/09/2016 Document Reviewed: 06/15/2014 Elsevier Interactive Patient Education  2018 Elsevier Inc.  

## 2018-07-17 NOTE — Progress Notes (Signed)
   Subjective:    Patient ID: Dan Perez, male    DOB: Aug 07, 1932, 84 y.o.   MRN: [id number]  Chief Complaint  Patient presents with  . left rib pain   PT presents to the office today with recurrent left upper rib pain. He went to the ED on 07/15/18 and had a negative chest x-ray, EKG, and lab work. PT was given Ultram rx, but states he lost the rx.   His Granddaughter is present today and the patient will be staying with her tonight. She states she notice a large bruise on his left ribs, but the patient can not remember a fall or any injury.  Chest Pain   This is a new problem. The onset quality is sudden. The problem occurs intermittently. The problem has been waxing and waning. Pain location: left upper rib. The pain is at a severity of 7/10. The pain is moderate. The pain does not radiate. The pain is aggravated by deep breathing, breathing and coughing. He has tried rest for the symptoms. The treatment provided no relief.    Hospital notes reviewed.   Review of Systems  Cardiovascular: Positive for chest pain.  All other systems reviewed and are negative.      Objective:   Physical Exam  Constitutional: He is oriented to person, place, and time. He appears well-developed and well-nourished. No distress.  HENT:  Head: Normocephalic.  Eyes: Pupils are equal, round, and reactive to light. Right eye exhibits no discharge. Left eye exhibits no discharge.  Neck: Normal range of motion. Neck supple. No thyromegaly present.  Cardiovascular: Normal rate, regular rhythm and intact distal pulses.  Murmur heard. Pulmonary/Chest: Effort normal. No respiratory distress. He has decreased breath sounds. He has no wheezes.  Abdominal: Soft. Bowel sounds are normal. He exhibits no distension. There is no tenderness.  Musculoskeletal: He exhibits no edema or tenderness.  Generalized weakness, slow movements with walking and getting up  Neurological: He is alert and oriented to person,  place, and time. He has normal reflexes. No cranial nerve deficit.  Skin: Skin is warm and dry. Ecchymosis noted. No rash noted. No erythema.     Psychiatric: He has a normal mood and affect. His behavior is normal. Judgment and thought content normal.  Vitals reviewed.     BP (!) 144/72   Pulse 71   Temp (!) 97.4 F (36.3 C) (Oral)   Ht 5\' 6"  (1.676 m)   Wt 145 lb 3.2 oz (65.9 kg)   BMI 23.44 kg/m      Assessment & Plan:  Dan Perez comes in today with chief complaint of left rib pain   Diagnosis and orders addressed:  1. Contusion of rib on left side, initial encounter I will refill Ultram today since he lost rx Fall preventions discussed Pt needs to follow up with PCP RTO if symptoms worsen or do not improve - traMADol (ULTRAM) 50 MG tablet; Take 1 tablet (50 mg total) by mouth every 6 (six) hours as needed.  Dispense: 40 tablet; Refill: 0   Jannifer Rodney, FNP

## 2018-08-24 ENCOUNTER — Encounter: Payer: Self-pay | Admitting: Family Medicine

## 2018-08-25 ENCOUNTER — Other Ambulatory Visit: Payer: Self-pay | Admitting: *Deleted

## 2018-08-25 DIAGNOSIS — F0280 Dementia in other diseases classified elsewhere without behavioral disturbance: Secondary | ICD-10-CM

## 2018-08-25 DIAGNOSIS — G301 Alzheimer's disease with late onset: Principal | ICD-10-CM

## 2018-08-25 MED ORDER — MEMANTINE HCL-DONEPEZIL HCL ER 28-10 MG PO CP24: 1.0000 | ORAL_CAPSULE | Freq: Every day | ORAL | 3 refills | Status: DC

## 2019-01-20 ENCOUNTER — Telehealth: Payer: Self-pay | Admitting: Family Medicine

## 2019-01-22 ENCOUNTER — Other Ambulatory Visit: Payer: PPO

## 2019-01-28 ENCOUNTER — Ambulatory Visit (INDEPENDENT_AMBULATORY_CARE_PROVIDER_SITE_OTHER): Payer: PPO

## 2019-01-28 ENCOUNTER — Other Ambulatory Visit: Payer: Self-pay | Admitting: *Deleted

## 2019-01-28 ENCOUNTER — Other Ambulatory Visit: Payer: Self-pay

## 2019-01-28 ENCOUNTER — Telehealth: Payer: Self-pay | Admitting: *Deleted

## 2019-01-28 DIAGNOSIS — R011 Cardiac murmur, unspecified: Secondary | ICD-10-CM | POA: Diagnosis not present

## 2019-01-28 DIAGNOSIS — I350 Nonrheumatic aortic (valve) stenosis: Secondary | ICD-10-CM

## 2019-01-28 NOTE — Telephone Encounter (Signed)
Notes recorded by Lesle Chris, LPN on 4/94/4967 at 5:07 PM EDT Daughter Tamera Punt) notified. Copy to pmd. Already has OV scheduled for 02/24/19 in [place] office. She stated he was having spells where he gets pale, weak, then gets back to normal. She states he does have history of anemia & last labs in epic from October 2019 does show low Hgb. Suggested she take him to pmd to follow up on this - she agreed and verbalized understanding. ------  Notes recorded by Laqueta Linden, MD on 01/28/2019 at 12:47 PM EDT Normal pumping function. Severe calcium buildup on aortic valve leading to severe valve narrowing. Some mitral valve leakage noted. Please make sure he has a telehealth visit with [facility] in near future.

## 2019-02-16 ENCOUNTER — Other Ambulatory Visit: Payer: Self-pay

## 2019-02-16 ENCOUNTER — Encounter: Payer: Self-pay | Admitting: Family Medicine

## 2019-02-16 ENCOUNTER — Ambulatory Visit (INDEPENDENT_AMBULATORY_CARE_PROVIDER_SITE_OTHER): Payer: PPO | Admitting: Family Medicine

## 2019-02-16 DIAGNOSIS — I10 Essential (primary) hypertension: Secondary | ICD-10-CM | POA: Diagnosis not present

## 2019-02-16 DIAGNOSIS — G309 Alzheimer's disease, unspecified: Secondary | ICD-10-CM

## 2019-02-16 DIAGNOSIS — G301 Alzheimer's disease with late onset: Secondary | ICD-10-CM | POA: Diagnosis not present

## 2019-02-16 DIAGNOSIS — E890 Postprocedural hypothyroidism: Secondary | ICD-10-CM | POA: Diagnosis not present

## 2019-02-16 DIAGNOSIS — I350 Nonrheumatic aortic (valve) stenosis: Secondary | ICD-10-CM

## 2019-02-16 DIAGNOSIS — G3184 Mild cognitive impairment, so stated: Secondary | ICD-10-CM

## 2019-02-16 DIAGNOSIS — F0280 Dementia in other diseases classified elsewhere without behavioral disturbance: Secondary | ICD-10-CM | POA: Diagnosis not present

## 2019-02-16 DIAGNOSIS — J309 Allergic rhinitis, unspecified: Secondary | ICD-10-CM | POA: Diagnosis not present

## 2019-02-16 MED ORDER — MEMANTINE HCL-DONEPEZIL HCL ER 28-10 MG PO CP24: 1.0000 | ORAL_CAPSULE | Freq: Every day | ORAL | 3 refills | Status: AC

## 2019-02-16 MED ORDER — SERTRALINE HCL 50 MG PO TABS: 50.0000 mg | ORAL_TABLET | Freq: Every day | ORAL | 3 refills | Status: AC

## 2019-02-16 MED ORDER — LORATADINE 10 MG PO TABS: 10.0000 mg | ORAL_TABLET | Freq: Every day | ORAL | 11 refills | Status: AC

## 2019-02-16 MED ORDER — LISINOPRIL 5 MG PO TABS: 5.0000 mg | ORAL_TABLET | Freq: Every day | ORAL | 1 refills | Status: DC

## 2019-02-16 NOTE — Progress Notes (Signed)
Virtual Visit via telephone Note  I connected with Dan Perez on 02/16/19 at 1351 by telephone and verified that I am speaking with the correct person using two identifiers. Dan Perez is currently located at home and Dan Perez are currently with her during visit. The provider, Elige RadonJoshua A , MD is located in their office at time of visit.  Call ended at 1411  I discussed the limitations, risks, security and privacy concerns of performing an evaluation and management service by telephone and the availability of in person appointments. I also discussed with the patient that there may be a patient responsible charge related to this service. The patient expressed understanding and agreed to proceed.   History and Present Illness: Hypertension Patient is currently on lisinopril, and their blood pressure today is 150/98. Patient has intermittent lightheadedness or dizziness. Patient denies headaches, blurred vision, chest pains, shortness of breath, or weakness. Denies any side effects from medication and is content with current medication.   Hypothyroidism recheck Patient is coming in for thyroid recheck today as well. They deny any issues with hair changes or heat or cold problems or diarrhea or constipation. They deny any chest pain or palpitations. They are currently on no medications and has been out of levothyroxine for the past   Alzheimer's recheck He has been taking namzaric and felt like it has been helping some, his confusion is still worsening.  He is having more agitation and anxiety and is having trouble not living on his home.  He is having   No diagnosis found.  Outpatient Encounter Medications as of 02/16/2019  Medication Sig  . furosemide (LASIX) 20 MG tablet TAKE 1 TABLET BY MOUTH ONCE DAILY AS NEEDED FOR  EDEMA  . lisinopril (PRINIVIL,ZESTRIL) 5 MG tablet Take 1 tablet (5 mg total) by mouth daily.  Marland Kitchen. loratadine (CLARITIN) 10 MG tablet Take 1 tablet  (10 mg total) by mouth daily.  . Memantine HCl-Donepezil HCl (NAMZARIC) 28-10 MG CP24 Take 1 capsule by mouth daily.  . naproxen sodium (ANAPROX) 220 MG tablet Take 220 mg by mouth daily.   No facility-administered encounter medications on file as of 02/16/2019.     Review of Systems  Constitutional: Negative for chills and fever.  Eyes: Negative for visual disturbance.  Respiratory: Negative for shortness of breath and wheezing.   Cardiovascular: Negative for chest pain and leg swelling.  Musculoskeletal: Negative for back pain and gait problem.  Skin: Negative for rash.  Neurological: Negative for dizziness, weakness and light-headedness.  All other systems reviewed and are negative.   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  He has had a little bit more mood issues recently and she would like something to help with that, most of the conversation is with his daughter.  We will start Zoloft and see if it  goes well and We will have him come back in 3 months for recheck with blood work    I discussed the assessment and treatment plan with the patient. The patient was provided an opportunity to ask questions and all were answered. The patient agreed with the plan and demonstrated an understanding of the instructions.   The patient was advised to call back or seek an in-person evaluation if the symptoms worsen or if the condition fails to improve as anticipated.  The above assessment and management plan was discussed with the patient. The patient verbalized understanding of and has agreed to the management plan. Patient is aware to call the clinic if symptoms persist or worsen. Patient is aware when to return to the clinic for a follow-up visit. Patient educated on when it is appropriate to go to the emergency department.    I provided 20 minutes of non-face-to-face time during this encounter.    Nils Pyle, MD

## 2019-02-19 ENCOUNTER — Telehealth: Payer: Self-pay | Admitting: Cardiovascular Disease

## 2019-02-19 NOTE — Telephone Encounter (Signed)
Virtual Visit Pre-Appointment Phone Call  "(Name), I am calling you today to discuss your upcoming appointment. We are currently trying to limit exposure to the virus that causes COVID-19 by seeing patients at home rather than in the office."  1. "What is the BEST phone number to call the day of the visit?" - include this in appointment notes  2. Do you have or have access to (through a family member/friend) a smartphone with video capability that we can use for your visit?" a. If yes - list this number in appt notes as cell (if different from BEST phone #) and list the appointment type as a VIDEO visit in appointment notes b. If no - list the appointment type as a PHONE visit in appointment notes  3. Confirm consent - "In the setting of the current Covid19 crisis, you are scheduled for a (phone or video) visit with your provider on (date) at (time).  Just as we do with many in-office visits, in order for you to participate in this visit, we must obtain consent.  If you'd like, I can send this to your mychart (if signed up) or email for you to review.  Otherwise, I can obtain your verbal consent now.  All virtual visits are billed to your insurance company just like a normal visit would be.  By agreeing to a virtual visit, we'd like you to understand that the technology does not allow for your provider to perform an examination, and thus may limit your provider's ability to fully assess your condition. If your provider identifies any concerns that need to be evaluated in person, we will make arrangements to do so.  Finally, though the technology is pretty good, we cannot assure that it will always work on either your or our end, and in the setting of a video visit, we may have to convert it to a phone-only visit.  In either situation, we cannot ensure that we have a secure connection.  Are you willing to proceed?" STAFF: Did the patient verbally acknowledge consent to telehealth visit? Document  YES/NO here: Yes  4. Advise patient to be prepared - "Two hours prior to your appointment, go ahead and check your blood pressure, pulse, oxygen saturation, and your weight (if you have the equipment to check those) and write them all down. When your visit starts, your provider will ask you for this information. If you have an Apple Watch or Kardia device, please plan to have heart rate information ready on the day of your appointment. Please have a pen and paper handy nearby the day of the visit as well."  5. Give patient instructions for MyChart download to smartphone OR [email or web address] as below if video visit (depending on what platform provider is using)  6. Inform patient they will receive a phone call 15 minutes prior to their appointment time (may be from unknown caller ID) so they should be prepared to answer    TELEPHONE CALL NOTE  Dan Perez has been deemed a candidate for a follow-up tele-health visit to limit community exposure during the Covid-19 pandemic. I spoke with the patient via phone to ensure availability of phone/video source, confirm preferred email & phone number, and discuss instructions and expectations.  I reminded Dan Perez to be prepared with any vital sign and/or heart rhythm information that could potentially be obtained via home monitoring, at the time of his visit. I reminded Dan Perez to expect a phone call prior to  his visit.  Virgel Gess Goins 02/19/2019 11:32 AM

## 2019-02-24 ENCOUNTER — Telehealth (INDEPENDENT_AMBULATORY_CARE_PROVIDER_SITE_OTHER): Payer: PPO | Admitting: Cardiovascular Disease

## 2019-02-24 ENCOUNTER — Other Ambulatory Visit: Payer: Self-pay

## 2019-02-24 ENCOUNTER — Encounter: Payer: Self-pay | Admitting: Cardiovascular Disease

## 2019-02-24 VITALS — BP 190/110 | HR 65 | Ht 63.0 in | Wt 160.0 lb

## 2019-02-24 DIAGNOSIS — I350 Nonrheumatic aortic (valve) stenosis: Secondary | ICD-10-CM

## 2019-02-24 DIAGNOSIS — I340 Nonrheumatic mitral (valve) insufficiency: Secondary | ICD-10-CM

## 2019-02-24 DIAGNOSIS — I10 Essential (primary) hypertension: Secondary | ICD-10-CM

## 2019-02-24 DIAGNOSIS — D649 Anemia, unspecified: Secondary | ICD-10-CM

## 2019-02-24 DIAGNOSIS — R0609 Other forms of dyspnea: Secondary | ICD-10-CM

## 2019-02-24 MED ORDER — FUROSEMIDE 20 MG PO TABS: 20.0000 mg | ORAL_TABLET | ORAL | 3 refills | Status: AC | PRN

## 2019-02-24 MED ORDER — LISINOPRIL 10 MG PO TABS: 10.0000 mg | ORAL_TABLET | Freq: Every day | ORAL | 3 refills | Status: AC

## 2019-02-24 NOTE — Addendum Note (Signed)
Addended by: Debbora Lacrosse R on: 02/24/2019 09:51 AM   Modules accepted: Orders

## 2019-02-24 NOTE — Progress Notes (Addendum)
Virtual Visit via Telephone Note   This visit type was conducted due to national recommendations for restrictions regarding the COVID-19 Pandemic (e.g. social distancing) in an effort to limit this patient's exposure and mitigate transmission in our community.  Due to his co-morbid illnesses, this patient is at least at moderate risk for complications without adequate follow up.  This format is felt to be most appropriate for this patient at this time.  The patient did not have access to video technology/had technical difficulties with video requiring transitioning to audio format only (telephone).  All issues noted in this document were discussed and addressed.  No physical exam could be performed with this format.  Please refer to the patient's chart for his  consent to telehealth for [facility].   Date:  02/24/2019   ID:  Dan Perez, DOB 03/11/1932, MRN [id number]  Patient Location: Home Provider Location: Home  PCP:  Dettinger, Fransisca Kaufmann, MD  Cardiologist:  Kate Sable, MD  Electrophysiologist:  None   Evaluation Performed:  Follow-Up Visit  Chief Complaint:  Aortic stenosis  History of Present Illness:    Dan Perez is a 85 y.o. male with severe aortic stenosis and hypertension. He also has significant pectus excavatum.  I spoke with his daughter, Dan Perez, who was present at the last office visit. He denies exertional chest pain. He has been getting more short of breath since his last visit with me. He has had some leg soreness but no swelling. His daughter called our office on 5/13 saying he would have spells of weakness and he would briefly become pale. He has not had any recent labs and he has a history of anemia.  The patient does not have symptoms concerning for COVID-19 infection (fever, chills, cough, or new shortness of breath).    Social history: His daughter is a Quarry manager. She sees Dr.Branchfor dysautonomia. He used to work in a Equities trader for 30  years.   Past Medical History:  Diagnosis Date   AAA (abdominal aortic aneurysm) without rupture ([place])    [place] (11/03/2013): 3.9 x 3.3 cm   Chronic abdominal pain    Delusional disorder (HCC)    chronic   Essential hypertension, benign    History of goiter    Paranoia ([place])    Right inguinal hernia    to see Dr. Romona Curls for repair   Past Surgical History:  Procedure Laterality Date   CATARACT EXTRACTION, BILATERAL     EYE SURGERY Left 2004   Cataract    EYE SURGERY Right 2004   Cataract    INGUINAL HERNIA REPAIR Right 12/17/2013   Procedure: HERNIA REPAIR INGUINAL ADULT;  Surgeon: Scherry Ran, MD;  Location: [facility];  Service: General;  Laterality: Right;   THYROIDECTOMY  03/02/2011   Left lobectomy and isthmusectomy -      Current Meds  Medication Sig   furosemide (LASIX) 20 MG tablet TAKE 1 TABLET BY MOUTH ONCE DAILY AS NEEDED FOR  EDEMA   lisinopril (ZESTRIL) 5 MG tablet Take 1 tablet (5 mg total) by mouth daily.   Memantine HCl-Donepezil HCl (NAMZARIC) 28-10 MG CP24 Take 1 capsule by mouth daily.   Multiple Vitamin (MULTIVITAMIN) capsule Take 1 capsule by mouth daily.   naproxen sodium (ANAPROX) 220 MG tablet Take 220 mg by mouth daily.   sertraline (ZOLOFT) 50 MG tablet Take 1 tablet (50 mg total) by mouth daily.     Allergies:   Patient has no known allergies.  Social History   Tobacco Use   Smoking status: Former Smoker    Last attempt to quit: 08/12/1983    Years since quitting: 35.5   Smokeless tobacco: Never Used  Substance Use Topics   Alcohol use: No   Drug use: No     Family Hx: The patient's family history includes Alzheimer's disease in his mother and sister; Cancer in his brother; Dementia in his mother; Diabetes in his brother, daughter, and mother; Heart attack in his brother and father; Hyperlipidemia in his daughter, daughter, and son; Hypertension in his daughter and son; Stroke in his mother; Thyroid cancer in his  father and mother; Varicose Veins in his daughter and mother.  ROS:   Please see the history of present illness.     All other systems reviewed and are negative.   Prior CV studies:   The following studies were reviewed today:  Echocardiogram 01/28/19:   1. The left ventricle has normal systolic function, with an ejection fraction of 55-60%. The cavity size was normal. There is mildly increased left ventricular wall thickness. Left ventricular diastolic Doppler parameters are consistent with impaired  relaxation. No evidence of left ventricular regional wall motion abnormalities.  2. The right ventricle has normal systolic function. The cavity was normal. There is no increase in right ventricular wall thickness.  3. Small pericardial effusion.  4. The pericardial effusion is anterior to the right ventricle. Also possible left pleural effusion.  5. The aortic valve has an indeterminate number of cusps. Severe calcifcation of the aortic valve. Suspect evere stenosis of the aortic valve. Mean gradient is only 24 mmHg, but the calculated valve area and dimentionless index both are consistent with  severe stenosis. Moderate aortic annular calcification noted.  6. The mitral valve is degenerative. Mild thickening of the mitral valve leaflet. There is mild mitral annular calcification present. Mitral valve regurgitation is moderate by color flow Doppler.  7. The tricuspid valve is grossly normal.  8. The aortic root is normal in size and structure.  Labs/Other Tests and Data Reviewed:    EKG:  No ECG reviewed.  Recent Labs: 07/15/2018: ALT 14; BUN 22; Creatinine, Ser 1.19; Hemoglobin 11.6; Platelets 206; Potassium 4.1; Sodium 138   Recent Lipid Panel No results found for: CHOL, TRIG, HDL, CHOLHDL, LDLCALC, LDLDIRECT  Wt Readings from Last 3 Encounters:  02/24/19 160 lb (72.6 kg)  07/17/18 145 lb 3.2 oz (65.9 kg)  07/16/18 145 lb (65.8 kg)     Objective:    Vital Signs:  BP (!)  190/110 Comment: just taken madication   Pulse 65    Ht 5\' 3"  (1.6 m)    Wt 160 lb (72.6 kg)    SpO2 96%    BMI 28.34 kg/m    Recheck BP: 208/109  VITAL SIGNS:  reviewed  ASSESSMENT & PLAN:    1.  Severe symptomatic aortic stenosis:  He has been getting more short of breath. Echo from 01/28/19 reviewed above. I will make a structural heart referral. I previously educated both the patient and his daughter about eventual disease progression and the need for valve replacement(perhaps TAVR).  2.  Mitral regurgitation: Moderate in severity by echocardiogram in May 2020.   3.  Hypertension: Blood pressure is markedly elevated today. I will increase lisinopril to 10 mg daily. I will have him take an extra 5 mg today. I will check a BMET.  4.  Chest pain: No recurrences. This appeared to be musculoskeletal in etiology and  likely due to significant pectus excavatum.    5. Anemia: I will check a CBC.    COVID-19 Education: The signs and symptoms of COVID-19 were discussed with the patient and how to seek care for testing (follow up with PCP or arrange E-visit).  The importance of social distancing was discussed today.  Time:   Today, I have spent 25 minutes with the patient with telehealth technology discussing the above problems.     Medication Adjustments/Labs and Tests Ordered: Current medicines are reviewed at length with the patient today.  Concerns regarding medicines are outlined above.   Tests Ordered: No orders of the defined types were placed in this encounter.   Medication Changes: No orders of the defined types were placed in this encounter.   Disposition:  Follow up in 3 month(s)  Signed, Prentice DockerSuresh Annaliah Rivenbark, MD  02/24/2019 9:11 AM    [facility]

## 2019-02-24 NOTE — Progress Notes (Signed)
Medication Instructions:  Increase lisinopril to 10 mg daily   Labwork: bmet  Cbc   Testing/Procedures: Your physician has requested that you have an echocardiogram. Echocardiography is a painless test that uses sound waves to create images of your heart. It provides your doctor with information about the size and shape of your heart and how well your heart's chambers and valves are working. This procedure takes approximately one hour. There are no restrictions for this procedure.    Follow-Up: Your physician recommends that you schedule a follow-up appointment in: 3 months   Any Other Special Instructions Will Be Listed Below (If Applicable).     If you need a refill on your cardiac medications before your next appointment, please call your pharmacy.

## 2019-02-27 ENCOUNTER — Other Ambulatory Visit: Payer: Self-pay

## 2019-02-27 ENCOUNTER — Ambulatory Visit (HOSPITAL_COMMUNITY)
Admission: RE | Admit: 2019-02-27 | Discharge: 2019-02-27 | Disposition: A | Discharge status: Home / Self Care | Payer: PPO | Source: Ambulatory Visit | Attending: Cardiovascular Disease | Admitting: Cardiovascular Disease

## 2019-02-27 ENCOUNTER — Encounter (HOSPITAL_COMMUNITY): Payer: Self-pay

## 2019-02-27 ENCOUNTER — Telehealth: Payer: Self-pay

## 2019-02-27 DIAGNOSIS — I350 Nonrheumatic aortic (valve) stenosis: Secondary | ICD-10-CM

## 2019-02-27 DIAGNOSIS — I10 Essential (primary) hypertension: Secondary | ICD-10-CM

## 2019-02-27 NOTE — Telephone Encounter (Signed)
Referred to Structural Heart Team, messaged Nell Range. Please call daughter Jeannetta Nap [phone] and also communicate through [place]

## 2019-03-02 ENCOUNTER — Other Ambulatory Visit: Payer: Self-pay | Admitting: *Deleted

## 2019-03-02 ENCOUNTER — Telehealth: Payer: Self-pay | Admitting: Cardiovascular Disease

## 2019-03-02 ENCOUNTER — Telehealth: Payer: Self-pay

## 2019-03-02 ENCOUNTER — Other Ambulatory Visit: Payer: Self-pay

## 2019-03-02 ENCOUNTER — Other Ambulatory Visit: Payer: PPO

## 2019-03-02 DIAGNOSIS — I10 Essential (primary) hypertension: Secondary | ICD-10-CM

## 2019-03-02 DIAGNOSIS — R0609 Other forms of dyspnea: Secondary | ICD-10-CM

## 2019-03-02 DIAGNOSIS — E890 Postprocedural hypothyroidism: Secondary | ICD-10-CM

## 2019-03-02 NOTE — Telephone Encounter (Signed)
Referral placed - daughter aware.  

## 2019-03-02 NOTE — Telephone Encounter (Signed)
ERROR

## 2019-03-02 NOTE — Telephone Encounter (Signed)
Daughter called and is asking that you place a referral for patient to see Dr. Dorris Fetch at [facility] for his hypothyroidism.

## 2019-03-02 NOTE — Telephone Encounter (Signed)
Yes okay to go ahead and do the referral to Dr. Dorien Chihuahua for hypothyroidism

## 2019-03-03 LAB — BASIC METABOLIC PANEL
BUN/Creatinine Ratio: 12 (ref 10–24)
BUN: 17 mg/dL (ref 8–27)
CO2: 25 mmol/L (ref 20–29)
Calcium: 8.8 mg/dL (ref 8.6–10.2)
Chloride: 97 mmol/L (ref 96–106)
Creatinine, Ser: 1.39 mg/dL — ABNORMAL HIGH (ref 0.76–1.27)
GFR calc Af Amer: 53 mL/min/{1.73_m2} — ABNORMAL LOW (ref >59)
GFR calc non Af Amer: 46 mL/min/{1.73_m2} — ABNORMAL LOW (ref >59)
Glucose: 96 mg/dL (ref 65–99)
Potassium: 4.4 mmol/L (ref 3.5–5.2)
Sodium: 136 mmol/L (ref 134–144)

## 2019-03-03 LAB — SPECIMEN STATUS

## 2019-03-03 LAB — SPECIMEN STATUS REPORT

## 2019-03-05 ENCOUNTER — Telehealth: Payer: Self-pay | Admitting: *Deleted

## 2019-03-05 NOTE — Telephone Encounter (Signed)
Daughter also thought TSH would be done.  Only looks like CBC & BMET were done.  He does have new patient scheduled with Dr. Dorris Fetch for July.

## 2019-03-05 NOTE — Telephone Encounter (Signed)
Notes recorded by Laurine Blazer, LPN on 2/86/3817 at 7:11 PM EDT  Daughter Jeannetta Nap) notified. Will mail reminder when time to repeat. ([facility])  ------   Notes recorded by Arnoldo Lenis, MD on 03/05/2019 at 12:45 PM EDT  Dr Raliegh Ip patient, Labs look ok. Slight decrease in renal function but within his prior range. Would repeat another BMET in 4 weeks

## 2019-03-06 ENCOUNTER — Inpatient Hospital Stay (HOSPITAL_COMMUNITY)
Admission: AD | Admit: 2019-03-06 | Discharge: 2019-03-23 | DRG: 266 | Disposition: A | Discharge status: Skilled Nursing Facility | Payer: PPO | Source: Other Acute Inpatient Hospital | Attending: Cardiovascular Disease | Admitting: Cardiovascular Disease

## 2019-03-06 ENCOUNTER — Other Ambulatory Visit: Payer: Self-pay

## 2019-03-06 ENCOUNTER — Encounter (HOSPITAL_COMMUNITY): Payer: Self-pay | Admitting: Nurse Practitioner

## 2019-03-06 ENCOUNTER — Telehealth: Payer: Self-pay

## 2019-03-06 DIAGNOSIS — R41841 Cognitive communication deficit: Secondary | ICD-10-CM | POA: Diagnosis not present

## 2019-03-06 DIAGNOSIS — I080 Rheumatic disorders of both mitral and aortic valves: Principal | ICD-10-CM | POA: Diagnosis present

## 2019-03-06 DIAGNOSIS — R509 Fever, unspecified: Secondary | ICD-10-CM | POA: Diagnosis not present

## 2019-03-06 DIAGNOSIS — Q676 Pectus excavatum: Secondary | ICD-10-CM

## 2019-03-06 DIAGNOSIS — Z9841 Cataract extraction status, right eye: Secondary | ICD-10-CM

## 2019-03-06 DIAGNOSIS — E43 Unspecified severe protein-calorie malnutrition: Secondary | ICD-10-CM | POA: Diagnosis present

## 2019-03-06 DIAGNOSIS — S199XXA Unspecified injury of neck, initial encounter: Secondary | ICD-10-CM | POA: Diagnosis not present

## 2019-03-06 DIAGNOSIS — Z681 Body mass index (BMI) 19 or less, adult: Secondary | ICD-10-CM | POA: Diagnosis not present

## 2019-03-06 DIAGNOSIS — Z808 Family history of malignant neoplasm of other organs or systems: Secondary | ICD-10-CM

## 2019-03-06 DIAGNOSIS — M419 Scoliosis, unspecified: Secondary | ICD-10-CM | POA: Diagnosis not present

## 2019-03-06 DIAGNOSIS — Z833 Family history of diabetes mellitus: Secondary | ICD-10-CM

## 2019-03-06 DIAGNOSIS — E785 Hyperlipidemia, unspecified: Secondary | ICD-10-CM | POA: Diagnosis not present

## 2019-03-06 DIAGNOSIS — Z820 Family history of epilepsy and other diseases of the nervous system: Secondary | ICD-10-CM | POA: Diagnosis not present

## 2019-03-06 DIAGNOSIS — Z79899 Other long term (current) drug therapy: Secondary | ICD-10-CM | POA: Diagnosis not present

## 2019-03-06 DIAGNOSIS — I350 Nonrheumatic aortic (valve) stenosis: Secondary | ICD-10-CM | POA: Diagnosis not present

## 2019-03-06 DIAGNOSIS — F0390 Unspecified dementia without behavioral disturbance: Secondary | ICD-10-CM | POA: Diagnosis not present

## 2019-03-06 DIAGNOSIS — N179 Acute kidney failure, unspecified: Secondary | ICD-10-CM | POA: Diagnosis not present

## 2019-03-06 DIAGNOSIS — W1839XA Other fall on same level, initial encounter: Secondary | ICD-10-CM | POA: Diagnosis not present

## 2019-03-06 DIAGNOSIS — S0181XA Laceration without foreign body of other part of head, initial encounter: Secondary | ICD-10-CM | POA: Diagnosis not present

## 2019-03-06 DIAGNOSIS — R55 Syncope and collapse: Secondary | ICD-10-CM | POA: Diagnosis not present

## 2019-03-06 DIAGNOSIS — I10 Essential (primary) hypertension: Secondary | ICD-10-CM | POA: Diagnosis not present

## 2019-03-06 DIAGNOSIS — Z20828 Contact with and (suspected) exposure to other viral communicable diseases: Secondary | ICD-10-CM | POA: Diagnosis present

## 2019-03-06 DIAGNOSIS — I517 Cardiomegaly: Secondary | ICD-10-CM | POA: Diagnosis not present

## 2019-03-06 DIAGNOSIS — Z87891 Personal history of nicotine dependence: Secondary | ICD-10-CM | POA: Diagnosis not present

## 2019-03-06 DIAGNOSIS — E039 Hypothyroidism, unspecified: Secondary | ICD-10-CM | POA: Diagnosis not present

## 2019-03-06 DIAGNOSIS — K5730 Diverticulosis of large intestine without perforation or abscess without bleeding: Secondary | ICD-10-CM | POA: Diagnosis not present

## 2019-03-06 DIAGNOSIS — F329 Major depressive disorder, single episode, unspecified: Secondary | ICD-10-CM | POA: Diagnosis not present

## 2019-03-06 DIAGNOSIS — R609 Edema, unspecified: Secondary | ICD-10-CM | POA: Diagnosis not present

## 2019-03-06 DIAGNOSIS — Z01810 Encounter for preprocedural cardiovascular examination: Secondary | ICD-10-CM | POA: Diagnosis not present

## 2019-03-06 DIAGNOSIS — Z954 Presence of other heart-valve replacement: Secondary | ICD-10-CM | POA: Diagnosis not present

## 2019-03-06 DIAGNOSIS — Z7401 Bed confinement status: Secondary | ICD-10-CM | POA: Diagnosis not present

## 2019-03-06 DIAGNOSIS — R410 Disorientation, unspecified: Secondary | ICD-10-CM | POA: Diagnosis not present

## 2019-03-06 DIAGNOSIS — E440 Moderate protein-calorie malnutrition: Secondary | ICD-10-CM | POA: Diagnosis not present

## 2019-03-06 DIAGNOSIS — I872 Venous insufficiency (chronic) (peripheral): Secondary | ICD-10-CM | POA: Diagnosis not present

## 2019-03-06 DIAGNOSIS — I129 Hypertensive chronic kidney disease with stage 1 through stage 4 chronic kidney disease, or unspecified chronic kidney disease: Secondary | ICD-10-CM | POA: Diagnosis not present

## 2019-03-06 DIAGNOSIS — Z953 Presence of xenogenic heart valve: Secondary | ICD-10-CM

## 2019-03-06 DIAGNOSIS — S0191XA Laceration without foreign body of unspecified part of head, initial encounter: Secondary | ICD-10-CM | POA: Diagnosis not present

## 2019-03-06 DIAGNOSIS — I2510 Atherosclerotic heart disease of native coronary artery without angina pectoris: Secondary | ICD-10-CM | POA: Diagnosis present

## 2019-03-06 DIAGNOSIS — Z006 Encounter for examination for normal comparison and control in clinical research program: Secondary | ICD-10-CM | POA: Diagnosis not present

## 2019-03-06 DIAGNOSIS — F0280 Dementia in other diseases classified elsewhere without behavioral disturbance: Secondary | ICD-10-CM | POA: Diagnosis present

## 2019-03-06 DIAGNOSIS — Z952 Presence of prosthetic heart valve: Secondary | ICD-10-CM

## 2019-03-06 DIAGNOSIS — Z823 Family history of stroke: Secondary | ICD-10-CM | POA: Diagnosis not present

## 2019-03-06 DIAGNOSIS — Z01818 Encounter for other preprocedural examination: Secondary | ICD-10-CM | POA: Diagnosis not present

## 2019-03-06 DIAGNOSIS — S299XXA Unspecified injury of thorax, initial encounter: Secondary | ICD-10-CM | POA: Diagnosis not present

## 2019-03-06 DIAGNOSIS — N183 Chronic kidney disease, stage 3 unspecified: Secondary | ICD-10-CM

## 2019-03-06 DIAGNOSIS — J302 Other seasonal allergic rhinitis: Secondary | ICD-10-CM | POA: Diagnosis not present

## 2019-03-06 DIAGNOSIS — Z8249 Family history of ischemic heart disease and other diseases of the circulatory system: Secondary | ICD-10-CM | POA: Diagnosis not present

## 2019-03-06 DIAGNOSIS — Z9842 Cataract extraction status, left eye: Secondary | ICD-10-CM | POA: Diagnosis not present

## 2019-03-06 DIAGNOSIS — R52 Pain, unspecified: Secondary | ICD-10-CM | POA: Diagnosis not present

## 2019-03-06 DIAGNOSIS — R262 Difficulty in walking, not elsewhere classified: Secondary | ICD-10-CM | POA: Diagnosis not present

## 2019-03-06 DIAGNOSIS — M2550 Pain in unspecified joint: Secondary | ICD-10-CM | POA: Diagnosis not present

## 2019-03-06 DIAGNOSIS — J986 Disorders of diaphragm: Secondary | ICD-10-CM | POA: Diagnosis not present

## 2019-03-06 DIAGNOSIS — E032 Hypothyroidism due to medicaments and other exogenous substances: Secondary | ICD-10-CM | POA: Diagnosis present

## 2019-03-06 DIAGNOSIS — Z8679 Personal history of other diseases of the circulatory system: Secondary | ICD-10-CM | POA: Diagnosis not present

## 2019-03-06 DIAGNOSIS — R1312 Dysphagia, oropharyngeal phase: Secondary | ICD-10-CM | POA: Diagnosis not present

## 2019-03-06 DIAGNOSIS — Z23 Encounter for immunization: Secondary | ICD-10-CM | POA: Diagnosis not present

## 2019-03-06 DIAGNOSIS — S0990XA Unspecified injury of head, initial encounter: Secondary | ICD-10-CM | POA: Diagnosis not present

## 2019-03-06 DIAGNOSIS — M6281 Muscle weakness (generalized): Secondary | ICD-10-CM | POA: Diagnosis not present

## 2019-03-06 DIAGNOSIS — G309 Alzheimer's disease, unspecified: Secondary | ICD-10-CM | POA: Diagnosis present

## 2019-03-06 DIAGNOSIS — I714 Abdominal aortic aneurysm, without rupture, unspecified: Secondary | ICD-10-CM | POA: Diagnosis present

## 2019-03-06 DIAGNOSIS — Z95828 Presence of other vascular implants and grafts: Secondary | ICD-10-CM | POA: Diagnosis not present

## 2019-03-06 DIAGNOSIS — F0281 Dementia in other diseases classified elsewhere with behavioral disturbance: Secondary | ICD-10-CM | POA: Diagnosis not present

## 2019-03-06 DIAGNOSIS — I700 Atherosclerosis of aorta: Secondary | ICD-10-CM | POA: Diagnosis not present

## 2019-03-06 HISTORY — DX: Chronic kidney disease, stage 3 unspecified: N18.30

## 2019-03-06 HISTORY — DX: Nonrheumatic aortic (valve) stenosis: I35.0

## 2019-03-06 HISTORY — DX: Atherosclerotic heart disease of native coronary artery without angina pectoris: I25.10

## 2019-03-06 LAB — TROPONIN I: Troponin I: 0.03 ng/mL (ref <0.03)

## 2019-03-06 MED ORDER — ENOXAPARIN SODIUM 40 MG/0.4ML ~~LOC~~ SOLN
40.0000 mg | SUBCUTANEOUS | Status: DC
  Administered 2019-03-06 – 2019-03-09 (×4): 40 mg via SUBCUTANEOUS
  Filled 2019-03-06 (×4): qty 0.4

## 2019-03-06 MED ORDER — SERTRALINE HCL 50 MG PO TABS
50.0000 mg | ORAL_TABLET | Freq: Every day | ORAL | Status: DC
  Administered 2019-03-07 – 2019-03-23 (×15): 50 mg via ORAL
  Filled 2019-03-06 (×15): qty 1

## 2019-03-06 MED ORDER — SODIUM CHLORIDE 0.9% FLUSH: 3.0000 mL | INTRAVENOUS | Status: DC | PRN

## 2019-03-06 MED ORDER — ONDANSETRON HCL 4 MG/2ML IJ SOLN: 4.0000 mg | Freq: Four times a day (QID) | INTRAMUSCULAR | Status: DC | PRN

## 2019-03-06 MED ORDER — SODIUM CHLORIDE 0.9% FLUSH
3.0000 mL | Freq: Two times a day (BID) | INTRAVENOUS | Status: DC
  Administered 2019-03-06 – 2019-03-17 (×17): 3 mL via INTRAVENOUS

## 2019-03-06 MED ORDER — MEMANTINE HCL-DONEPEZIL HCL 28-10 MG PO CP24
1.0000 | ORAL_CAPSULE | Freq: Every day | ORAL | Status: DC
Start: 2019-03-07 — End: 2019-03-06

## 2019-03-06 MED ORDER — MEMANTINE HCL ER 28 MG PO CP24
28.0000 mg | ORAL_CAPSULE | Freq: Every day | ORAL | Status: DC
  Administered 2019-03-06 – 2019-03-22 (×17): 28 mg via ORAL
  Filled 2019-03-06 (×19): qty 1

## 2019-03-06 MED ORDER — AMLODIPINE BESYLATE 5 MG PO TABS
5.0000 mg | ORAL_TABLET | Freq: Every day | ORAL | Status: DC
  Administered 2019-03-06 – 2019-03-23 (×17): 5 mg via ORAL
  Filled 2019-03-06 (×4): qty 2
  Filled 2019-03-06: qty 1
  Filled 2019-03-06 (×5): qty 2
  Filled 2019-03-06 (×2): qty 1
  Filled 2019-03-06: qty 2
  Filled 2019-03-06: qty 1
  Filled 2019-03-06: qty 2
  Filled 2019-03-06 (×2): qty 1

## 2019-03-06 MED ORDER — DONEPEZIL HCL 5 MG PO TABS
10.0000 mg | ORAL_TABLET | Freq: Every day | ORAL | Status: DC
  Administered 2019-03-06 – 2019-03-22 (×17): 10 mg via ORAL
  Filled 2019-03-06: qty 1
  Filled 2019-03-06: qty 2
  Filled 2019-03-06: qty 1
  Filled 2019-03-06 (×2): qty 2
  Filled 2019-03-06 (×2): qty 1
  Filled 2019-03-06 (×3): qty 2
  Filled 2019-03-06 (×2): qty 1
  Filled 2019-03-06: qty 2
  Filled 2019-03-06 (×5): qty 1

## 2019-03-06 MED ORDER — LISINOPRIL 10 MG PO TABS: 10.0000 mg | ORAL_TABLET | Freq: Every day | ORAL | Status: DC

## 2019-03-06 MED ORDER — SODIUM CHLORIDE 0.9 % IV SOLN: 250.0000 mL | INTRAVENOUS | Status: DC | PRN

## 2019-03-06 MED ORDER — ACETAMINOPHEN 325 MG PO TABS: 650.0000 mg | ORAL_TABLET | ORAL | Status: DC | PRN

## 2019-03-06 NOTE — Progress Notes (Signed)
Pt daughter [name] requests to be called when MD arrives in morning for updates on pt. [facility] Caller number is [phone].

## 2019-03-06 NOTE — Telephone Encounter (Signed)
There was some issue with the specimne, only the BMET has been reported. Can we add a cbc and TSH then to his repeat labs next month, I would be sure to go before he has his appt with Dr Montel Clock MD

## 2019-03-06 NOTE — Progress Notes (Addendum)
Pt has been admitted to [facility].MD paged for admitting orders. Pt alert and oriented x2.  Per pt reports that daughter [name] is legal guardian over pt. [name] was called to confirm. Pt has abrasions and skin tears from fall. Pt ribs distended. MD aware.Pt also has sutures on left forehead from the fall. Will continue to monitor pt.

## 2019-03-06 NOTE — Telephone Encounter (Signed)
Daughter called and stated that patient fell a little while ago and they are not sure what caused the fall, if he hit his head, or what exactly happened. She said her sister went inside the house for a few seconds and when she returned he was sitting on the porch and was bleeding and had a large gash. The sister took him to [facility] in [place] to be evaluated. Daughter wants to know if we suggest they take him to [facility] instead so they will be able to access his medical records. I advised that [facility] would be ok to treat the patient. If they felt more comfortable with him going to [facility] because he is in the system then they are welcome to take him there. Daughter verbalized understanding. Advised for them to call us back if they needed any further assistance.

## 2019-03-06 NOTE — Plan of Care (Signed)
  Problem: Education: Goal: Ability to demonstrate management of disease process will improve Outcome: Progressing Goal: Ability to verbalize understanding of medication therapies will improve Outcome: Progressing Goal: Individualized Educational Video(s) Outcome: Not Met (add Reason)   Problem: Activity: Goal: Capacity to carry out activities will improve Outcome: Progressing   Problem: Cardiac: Goal: Ability to achieve and maintain adequate cardiopulmonary perfusion will improve Outcome: Progressing   Problem: Education: Goal: Knowledge of General Education information will improve Description: Including pain rating scale, medication(s)/side effects and non-pharmacologic comfort measures Outcome: Progressing   Problem: Health Behavior/Discharge Planning: Goal: Ability to manage health-related needs will improve Outcome: Progressing   Problem: Clinical Measurements: Goal: Ability to maintain clinical measurements within normal limits will improve Outcome: Progressing Goal: Will remain free from infection Outcome: Progressing Goal: Diagnostic test results will improve Outcome: Progressing Goal: Respiratory complications will improve Outcome: Progressing Goal: Cardiovascular complication will be avoided Outcome: Progressing   Problem: Activity: Goal: Risk for activity intolerance will decrease Outcome: Not Met (add Reason)   Problem: Nutrition: Goal: Adequate nutrition will be maintained Outcome: Not Met (add Reason)   Problem: Elimination: Goal: Will not experience complications related to bowel motility Outcome: Progressing Goal: Will not experience complications related to urinary retention Outcome: Progressing   Problem: Coping: Goal: Level of anxiety will decrease Outcome: Not Met (add Reason)   Problem: Pain Managment: Goal: General experience of comfort will improve Outcome: Progressing   Problem: Safety: Goal: Ability to remain free from injury will  improve Outcome: Not Met (add Reason)

## 2019-03-06 NOTE — Progress Notes (Signed)
CRITICAL VALUE ALERT  Critical Value:  Troponin: 0.03  Date & Time Notied: 03/06/2019  Provider Notified: Cardiology  Orders Received/Actions taken: Awaiting

## 2019-03-06 NOTE — Progress Notes (Signed)
Pt blood pressure is high. MD paged. MD ordered RN to wait until it gets over systolic of 924 or pt is symptomatic. Will continue to monitor pt.

## 2019-03-06 NOTE — H&P (Addendum)
History & Physical    Patient ID: Dan Perez MRN: [id number], DOB/AGE: 83-Aug-1933   Admit date: 03/06/2019   Primary Physician: Dettinger, Elige Radon, MD Primary Cardiologist: Prentice Docker, MD  Patient Profile    85 y/o ? with a h/o progressive and now severe aortic stenosis, moderate MR, HTN, CKD III, and coronary calcium, who presents on tx from [facility] for admission following syncope.  Past Medical History    Past Medical History:  Diagnosis Date   AAA (abdominal aortic aneurysm) without rupture (HCC)    a. [place] (11/03/2013): 3.9 x 3.3 cm; b. 01/2018 CT chest: mild dil of Abd Ao- 3.9cm.   Chronic abdominal pain    CKD (chronic kidney disease), stage III (HCC)    Coronary artery calcification    a. 01/2018 Chest CT: Cor Ca2+ noted.   Delusional disorder (HCC)    chronic   Essential hypertension, benign    History of goiter    Paranoia (HCC)    Right inguinal hernia    to see Dr. Malvin Johns for repair   Severe aortic stenosis    a. 06/2018 Echo: EF 60-65%, Gr1 DD. Mod to sev AS. At least mod MR; b. 01/2019 Echo: EF 55-60%. No rwma. Nl RV fxn. Sev AoV Ca2+ w/ Sev AS. Mean grad only but AoV area 0.75cm^2 (Vmax).    Past Surgical History:  Procedure Laterality Date   CATARACT EXTRACTION, BILATERAL     EYE SURGERY Left 2004   Cataract    EYE SURGERY Right 2004   Cataract    INGUINAL HERNIA REPAIR Right 12/17/2013   Procedure: HERNIA REPAIR INGUINAL ADULT;  Surgeon: Marlane Hatcher, MD;  Location: [facility];  Service: General;  Laterality: Right;   THYROIDECTOMY  03/02/2011   Left lobectomy and isthmusectomy -      Allergies  No Known Allergies  History of Present Illness    85 y/o ? with a h/o HTN, progressive/Severe aortic stenosis, stage III chronic kidney disease, moderate mitral regurgitation, dilated abdominal aorta, and coronary calcium noted on prior CT.  Aortic stenosis has been followed over the years with serial echocardiography.   Most recent echo in May of this year, showed progression to severe aortic stenosis with a relatively low mean gradient of 24 mmHg but with an aortic valve area of 0.75 cm.  He was recently evaluated by Dr. Purvis Sheffield on 6/9, b/c his dtr reported that he was having increasing dyspnea along with sudden spells of weakness during which, he would become very pale.  These spells were short lived.  Patient says that he has no recollection of these spells occurring, simply that his daughter tells him that they happen.  In the setting of concern for possible syncope and known severe AS, he was referred for TAVR eval and currently has an appt w/ Dr. Clifton James on 7/6.  Of note, he has been markedly hypertensive at home and on office visits, and this has resulted in titration of his ACE inhibitor therapy.  He also takes Lasix.  Patient says that at home, he typically does pretty well and ambulates without difficulty.  However, earlier this week, possibly 2 days ago, he had an episode of sudden loss of consciousness while walking, causing him to fall forward.  Fortunately, he did not sustain any trauma.  It is not clear as to how long he was without consciousness, but he does not think that it was very long.  Unfortunately, this occurred today.  He says  that he drove to a location.  He was only in the car for about 15 minutes.  Shortly after arriving, he got out of his car and when he turned to walk, he had sudden loss of consciousness.  He denies any significant prodromal symptoms.  He says the next thing he was aware of was when he regained consciousness and was on the ground.  He had struck the left side of his face and was bleeding.  In that setting, he was taken to [facility] emergency department where he was found to be in sinus rhythm.  Lab work was relatively unremarkable, though his creatinine was slightly elevated at 1.48 with a BUN of 21.  Troponin was mildly elevated at 0.02.  No EKGs available for review.  Head CT was  performed and did not show any acute intracranial abnormality.  Soft tissue hematoma overlying the lower left frontal bone and orbit, maxilla, and zygoma was noted without fracture.  Laceration was treated with suturing.  He was hypertensive with blood pressures in the 160s.  Given history of aortic stenosis, decision was [facility].  Patient is currently symptom-free and in sinus rhythm on the monitor.   Home Medications    Prior to Admission medications   Medication Sig Start Date End Date Taking? Authorizing Provider  furosemide (LASIX) 20 MG tablet Take 1 tablet (20 mg total) by mouth as needed. 02/24/19   Laqueta LindenKoneswaran, Suresh A, MD  lisinopril (ZESTRIL) 10 MG tablet Take 1 tablet (10 mg total) by mouth daily. 02/24/19 05/25/19  Laqueta LindenKoneswaran, Suresh A, MD  loratadine (CLARITIN) 10 MG tablet Take 1 tablet (10 mg total) by mouth daily. 02/16/19   Dettinger, Elige RadonJoshua A, MD  Memantine HCl-Donepezil HCl (NAMZARIC) 28-10 MG CP24 Take 1 capsule by mouth daily. 02/16/19   Dettinger, Elige RadonJoshua A, MD  Multiple Vitamin (MULTIVITAMIN) capsule Take 1 capsule by mouth daily.    [provider]  naproxen sodium (ANAPROX) 220 MG tablet Take 220 mg by mouth daily.    [provider]  sertraline (ZOLOFT) 50 MG tablet Take 1 tablet (50 mg total) by mouth daily. 02/16/19   Dettinger, Elige RadonJoshua A, MD    Family History    Family History  Problem Relation Age of Onset   Heart attack Father    Thyroid cancer Father    Dementia Mother    Stroke Mother    Diabetes Mother    Varicose Veins Mother    Thyroid cancer Mother    Alzheimer's disease Mother    Diabetes Brother    Diabetes Daughter    Hyperlipidemia Daughter    Hypertension Daughter    Varicose Veins Daughter    Hyperlipidemia Son    Hypertension Son    Hyperlipidemia Daughter    Alzheimer's disease Sister    Cancer Brother        thyroid   Heart attack Brother    He indicated that his  mother is deceased. He indicated that his father is deceased. He indicated that both of his sisters are deceased. He indicated that all of his four brothers are deceased. He indicated that all of his three daughters are alive. He indicated that both of his sons are alive.   Social History    Social History   Socioeconomic History   Marital status: Divorced    Spouse name: Not on file   Number of children: 5   Years of education: 13   Highest education level:  Associate degree: occupational, technical, or vocational program  Occupational History   Not on file  Social Needs   Financial resource strain: Not very hard   Food insecurity    Worry: Never true    Inability: Never true   Transportation needs    Medical: No    Non-medical: No  Tobacco Use   Smoking status: Former Smoker    Quit date: 08/12/1983    Years since quitting: 35.5   Smokeless tobacco: Never Used  Substance and Sexual Activity   Alcohol use: No   Drug use: No   Sexual activity: Not Currently  Lifestyle   Physical activity    Days per week: 0 days    Minutes per session: 0 min   Stress: Only a little  Relationships   Social connections    Talks on phone: More than three times a week    Gets together: More than three times a week    Attends religious service: 1 to 4 times per year    Active member of club or organization: No    Attends meetings of clubs or organizations: Never    Relationship status: Divorced   Intimate partner violence    Fear of current or ex partner: No    Emotionally abused: No    Physically abused: No    Forced sexual activity: No  Other Topics Concern   Not on file  Social History Narrative   Patient is divorced and lives in a one story home by himself. He has neighbors that visit and his children visit often and take him out to eat. He is retired and spent about 8 years in the [employer]. He is able to read/write and speak [place].      Review of Systems     General:  No chills, fever, night sweats or weight changes.  Cardiovascular:  No chest pain, dyspnea on exertion, edema, orthopnea, palpitations, paroxysmal nocturnal dyspnea. +++ syncope. Dermatological: No rash, lesions/masses Respiratory: No cough, dyspnea Urologic: No hematuria, dysuria Abdominal:   No nausea, vomiting, diarrhea, bright red blood per rectum, melena, or hematemesis Neurologic:  No visual changes, wkns, changes in mental status. All other systems reviewed and are otherwise negative except as noted above.  Physical Exam    Blood pressure (!) 172/91, pulse 61, temperature 97.8 F (36.6 C), temperature source Oral, resp. rate 18, SpO2 99 %.  General: Pleasant, NAD Psych: Normal affect. Neuro: Alert and oriented X 3. Moves all extremities spontaneously. HEENT: notable for bruising and swelling of left face/orbit.  Neck: Supple without bruits or JVD. Lungs:  Resp regular and unlabored, CTA. Heart: RRR no s3, s4, 3/6 SEM @ upper sternal borders. Abdomen: Soft, non-tender, non-distended, BS + x 4.  Extremities: No clubbing, cyanosis or edema. DP/PT/Radials 1+ and equal bilaterally.  Labs    Hemoglobin 11.1, hematocrit 35.9, WBC 6.8, platelets 202 Sodium 136, potassium 4.6, chloride 98, CO2 25.6, BUN 21, creatinine 1.48, glucose 105 Troponin T 0.02 Total bilirubin 0.2, alkaline phosphatase 73, AST 18.7, ALT 12 Calcium 9.0, total protein 6.9, albumin 3.8  Radiology Studies    Head CT 03/06/2019-12:52 PM  No acute intracranial abnormality.  No intracranial mass, hemorrhage or edema.  No skull fracture.  Chronic small vessel ischemic changes in the white matter. Soft tissue hematoma overlying the left lower frontal bone, lower left orbit, left maxilla, and left zygoma, without fracture.  No facial bone fracture or dislocation.   No fracture or acute subluxation within the  cervical spine.  Degenerative changes of the cervical spine, mild to moderate in degree.  ECG &  Cardiac Imaging    No ECG on chart for review.  Ordered and pending. Sinus rhythm in the 60s on the monitor currently.  Assessment & Plan    1.  Syncope: Patient with a history of progressive and now severe aortic stenosis who presents with a several week history of intermittent spells described as weakness and becoming pale.  Patient does not have much recollection of these spells occurring and is only aware that they might happen because his daughter has reported this to him.  He has had 2 syncopal spells in the past 2 days, each occurring while walking followed by sudden loss of consciousness.  He suffered left-sided facial trauma after today's fall.  [facility] was relatively unremarkable with the exception of left-sided facial/orbital hematoma with laceration requiring suturing.  He has been [facility] given significant aortic stenosis.  Plan to monitor on telemetry.  Check orthostatics with low threshold to hydrate.  Hold his ACE inhibitor and home dose of diuretic.  No driving.  2.  Severe aortic stenosis: Noted on recent echo in May.  Pending outpatient follow-up with our structural heart team on July 6.  Consider inpatient consultation if patient remains here over the weekend.  3.  Essential hypertension: Pressures have been elevated.  Checking orthostatics in the setting of aortic stenosis and syncope.  Hold home dose of ACE inhibitor and diuretic and will plan to add calcium channel blocker.  No beta-blocker in the setting of relative bradycardia.  4.  Stage III chronic kidney disease with mild acute kidney injury: Holding ACE inhibitor and Lasix.  ACE inhibitor has been titrated recently as an outpatient secondary hypertension.  5.  Elevated troponin: Troponin 0.02 at outside hospital.  Minimally positive.  No history of chest pain.  We will follow-up here.  In the setting of aortic stenosis, patient will require evaluation  of coronary anatomy at some point.  Signed, Nicolasa Duckinghristopher Berge, NP 03/06/2019, 8:01 PM    Attending note:  Patient seen and examined.  I reviewed his records including recent evaluation by Dr. Purvis SheffieldKoneswaran, discussed the case with Mr. Brion AlimentBerge NP.  Mr. Johna Sheriffickle has a history of calcific aortic stenosis in the moderate to more recently severe range.  Echocardiogram performed in May revealed a mean gradient of only 24 mmHg, however calculated valve area and dimensionless index more consistent with severe stenosis.  He was in the process of being referred for outpatient structural heart team evaluation.  He has apparently had episodes of decreased mentation and looking pale according to history provided by his daughter per chart review over the last few weeks at least, however within the last week two episodes of apparent frank syncope.  His history is somewhat difficult to follow, however the 2 episodes in question apparently occurred while he was standing, were not precipitated by particular activity or with a prodrome.  Today he had an event resulting in a fall to the left side of his head, laceration in his left frontal and orbital region, but no fracture by CT imaging.  [facility] and transferred for further evaluation.  He has been hypertensive recently as an outpatient, and on evaluation in the ER, lisinopril had been increased by Dr. Purvis SheffieldKoneswaran.  Telemetry monitor in the ER showed sinus rhythm, his troponin T level was nonsignificant, no ECG is available  for review.  He is noted to have renal insufficiency, CKD stage III range with creatinine up to 1.48.  He has also been on Lasix.  On examination he appears cachectic and disheveled.  Lungs are clear with decreased breath sounds.  Cardiac exam reveals a 3/6 systolic murmur mainly at the base, no gallop or rub.  He has a significant pectus excavatum deformity as well as scoliosis.  He has distal 2+ lower leg edema and venous stasis as  well.  Left frontal and periorbital laceration is sutured.  There is ecchymosis in this region.  Patient presents for evaluation of 2 episodes of frank syncope, etiology is not entirely clear at this point.  He does have severe aortic stenosis by recent echocardiogram, however the description seems to be more sudden and unpredictable suggesting possibly an arrhythmia.  Heart rate is in the 60s at this time with relatively normal intervals by his last ECG from October 2019.  It could be that he has conduction system disease in the setting of progressive aortic stenosis with intermittent pauses or heart block, although none of this has been actually observed.  With his renal insufficiency, recent increase in ACE inhibitor and use of Lasix, may also be component of volume contraction and orthostasis, although he is significantly hypertensive at baseline.  Will hold ACE inhibitor and Lasix, gently hydrate with repeat creatinine.  Check orthostatics.  Follow telemetry closely.  Check baseline troponin I at this facility, but doubt ACS at this time.  Would also check TSH with reported prior history of thyroid disease and pending endocrinology evaluation.  Depending on basic work-up, can then determine whether structural heart team consultation needs to occur as an inpatient for more rapid work-up, or can proceed as an outpatient.  Satira Sark, M.D., F.A.C.C.

## 2019-03-07 LAB — TSH: TSH: 1.24 u[IU]/mL (ref 0.350–4.500)

## 2019-03-07 LAB — CBC
HCT: 34.1 % — ABNORMAL LOW (ref 39.0–52.0)
Hemoglobin: 10.7 g/dL — ABNORMAL LOW (ref 13.0–17.0)
MCH: 28.5 pg (ref 26.0–34.0)
MCHC: 31.4 g/dL (ref 30.0–36.0)
MCV: 90.9 fL (ref 80.0–100.0)
Platelets: 178 10*3/uL (ref 150–400)
RBC: 3.75 MIL/uL — ABNORMAL LOW (ref 4.22–5.81)
RDW: 13.6 % (ref 11.5–15.5)
WBC: 6.5 10*3/uL (ref 4.0–10.5)
nRBC: 0 % (ref 0.0–0.2)

## 2019-03-07 LAB — SARS CORONAVIRUS 2: SARS Coronavirus 2: NOT DETECTED

## 2019-03-07 MED ORDER — ADULT MULTIVITAMIN W/MINERALS CH
1.0000 | ORAL_TABLET | Freq: Every day | ORAL | Status: DC
  Administered 2019-03-07 – 2019-03-23 (×15): 1 via ORAL
  Filled 2019-03-07 (×15): qty 1

## 2019-03-07 MED ORDER — ENSURE ENLIVE PO LIQD
237.0000 mL | Freq: Two times a day (BID) | ORAL | Status: DC
  Administered 2019-03-07 – 2019-03-23 (×30): 237 mL via ORAL

## 2019-03-07 NOTE — Progress Notes (Signed)
Progress Note  Patient Name: Dan Perez Date of Encounter: 03/07/2019  Primary Cardiologist: Prentice DockerSuresh Koneswaran, MD   Subjective   Pleasant sitting up in bed watching baseball and eating cake  Inpatient Medications    Scheduled Meds: . amLODipine  5 mg Oral Daily  . memantine  28 mg Oral QHS   And  . donepezil  10 mg Oral QHS  . enoxaparin (LOVENOX) injection  40 mg Subcutaneous Q24H  . feeding supplement (ENSURE ENLIVE)  237 mL Oral BID BM  . multivitamin with minerals  1 tablet Oral Daily  . sertraline  50 mg Oral Daily  . sodium chloride flush  3 mL Intravenous Q12H   Continuous Infusions: . sodium chloride     PRN Meds: sodium chloride, acetaminophen, ondansetron (ZOFRAN) IV, sodium chloride flush   Vital Signs    Vitals:   03/06/19 2215 03/06/19 2221 03/06/19 2354 03/07/19 0533  BP: (!) 166/103 (!) 161/104 (!) 180/89 (!) 162/91  Pulse: 60 70 60 63  Resp:   16 16  Temp:   98.3 F (36.8 C) 98.1 F (36.7 C)  TempSrc:   Oral   SpO2:   98%   Weight:    64.4 kg  Height:        Intake/Output Summary (Last 24 hours) at 03/07/2019 1323 Last data filed at 03/07/2019 0900 Gross per 24 hour  Intake 846 ml  Output 1275 ml  Net -429 ml   Last 3 Weights 03/07/2019 03/06/2019 02/24/2019  Weight (lbs) 142 lb 141 lb 160 lb  Weight (kg) 64.411 kg 63.957 kg 72.576 kg      Telemetry    No adverse arrhythmias- Personally Reviewed  ECG    Normal rhythm normal intervals- Personally Reviewed  Physical Exam   GEN: No acute distress.  Elderly Neck: No JVD, left-sided facial wounds noted Cardiac: RRR, 3/6 systolic murmur, no rubs, or gallops.  Respiratory: Clear to auscultation bilaterally. GI: Soft, nontender, non-distended  MS: No edema; No deformity. Neuro:  Nonfocal  Psych: Normal affect   Labs    Chemistry Recent Labs  Lab 03/02/19 0000  NA 136  K 4.4  CL 97  CO2 25  GLUCOSE 96  BUN 17  CREATININE 1.39*  CALCIUM 8.8  GFRNONAA 46*  GFRAA 53*      Hematology Recent Labs  Lab 03/02/19 0000 03/07/19 0427  WBC WILL FOLLOW 6.5  RBC WILL FOLLOW 3.75*  HGB WILL FOLLOW 10.7*  HCT WILL FOLLOW 34.1*  MCV WILL FOLLOW 90.9  MCH WILL FOLLOW 28.5  MCHC WILL FOLLOW 31.4  RDW WILL FOLLOW 13.6  PLT WILL FOLLOW 178    Cardiac Enzymes Recent Labs  Lab 03/06/19 2042  TROPONINI 0.03*   No results for input(s): TROPIPOC in the last 168 hours.   BNPNo results for input(s): BNP, PROBNP in the last 168 hours.   DDimer No results for input(s): DDIMER in the last 168 hours.   Radiology    No results found.  Cardiac Studies   Echo with severe aortic stenosis  Patient Profile     85 y.o. male progressive severe aortic stenosis moderate mitral regurgitation coronary artery calcium, syncopal episodes  Assessment & Plan    Severe aortic stenosis with syncope and fall - here to be evaluated by valve team.  This will take place on Monday.  So far appears stable.  Had wounds noted.  Very pleasant, polite. Doesn't remember fall.  In speaking with his daughter Dan Perez, [phone], he  does have some gait instability. - Certainly syncope can be associated with severe aortic stenosis and warrants a poor prognosis. -COVID-19 negative. -EKG shows sinus rhythm.  Dr. Myles Gip note reviewed.  Mild low level troponin elevation, myocardial injury in the setting of aortic stenosis.  Chronic kidney disease stage III- avoid NSAIDs.  For questions or updates, please contact [place] Please consult [email or web address] for contact info under        Signed, Candee Furbish, MD  03/07/2019, 1:23 PM

## 2019-03-07 NOTE — Progress Notes (Signed)
Initial Nutrition Assessment  RD working remotely.  DOCUMENTATION CODES:   Not applicable  INTERVENTION:   -Ensure Enlive po BID, each supplement provides 350 kcal and 20 grams of protein -MVI with minerals daily  NUTRITION DIAGNOSIS:   Inadequate oral intake related to decreased appetite as evidenced by per patient/family report.  GOAL:   Patient will meet greater than or equal to 90% of their needs  MONITOR:   PO intake, Supplement acceptance, Labs, Weight trends, Skin, I & O's  REASON FOR ASSESSMENT:   Malnutrition Screening Tool    ASSESSMENT:   85 y/o male with a h/o HTN, progressive/Severe aortic stenosis, stage III chronic kidney disease, moderate mitral regurgitation, dilated abdominal aorta, and coronary calcium noted on prior CT.  Aortic stenosis has been followed over the years with serial echocardiography.  Most recent echo in May of this year, showed progression to severe aortic stenosis with a relatively low mean gradient of 24 mmHg but with an aortic valve area of 0.75 cm.  He was recently evaluated by Dr. Bronson Ing on 6/9, b/c his dtr reported that he was having increasing dyspnea along with sudden spells of weakness during which, he would become very pale.  These spells were short lived.  Patient says that he has no recollection of these spells occurring, simply that his daughter tells him that they happen.  In the setting of concern for possible syncope and known severe AS, he was referred for TAVR eval and currently has an appt w/ Dr. Angelena Form on 7/6.  Of note, he has been markedly hypertensive at home and on office visits, and this has resulted in titration of his ACE inhibitor therapy.  He also takes Lasix.  Pt admitted with syncope.   Reviewed I/O's: -789 ml x 24 hours  UOP: 1.3 L x 24 hours  Spoke with pt on the phone, who was very pleasant and in good spirits today. He reports a general decline in health over the past 4-6 weeks, including a decreased  appetite. He is currently hungry, but does not recall eating anything today (noted documented meal completion 100%). Per pt, he lives alone, but has two daughters who live nearby who help cook for him. Pt unable to given an accurate diet recall (reports he is not a selective eater and will almost anything), but estimates he consumes 2 meals per day, which consist of a meat, starch, and vegetable. Prior to 4-6 weeks ago, pt reports he would usually clean his plate at meals, but now eats less. Pt reports he would sometimes not eat one food item from his meal (approximately 1/3 less than usual intake). He denies any difficulty chewing or swallowing food; he routinely consumes steak.  Pt endorses wt loss over the 4-6 week time period, but unsure of UBW or weight loss ("I don't keep up with that"). Per wt hx, pt has experienced a 5.3% wt loss over the past year, which is not significant for time frame.   Discussed with pt importance of good meal intake to promote healing. Pt is amenable to Ensure Enlive supplements to assist with increasing nutritional intake.  Labs reviewed.   NUTRITION - FOCUSED PHYSICAL EXAM:    Most Recent Value  Orbital Region  Unable to assess  Upper Arm Region  Unable to assess  Thoracic and Lumbar Region  Unable to assess  Buccal Region  Unable to assess  Temple Region  Unable to assess  Clavicle Bone Region  Unable to assess  Clavicle and  Acromion Bone Region  Unable to assess  Scapular Bone Region  Unable to assess  Dorsal Hand  Unable to assess  Patellar Region  Unable to assess  Anterior Thigh Region  Unable to assess  Posterior Calf Region  Unable to assess  Edema (RD Assessment)  Unable to assess  Hair  Unable to assess  Eyes  Unable to assess  Mouth  Unable to assess  Skin  Unable to assess  Nails  Unable to assess       Diet Order:   Diet Order            Diet Heart Room service appropriate? Yes; Fluid consistency: Thin  Diet effective now               EDUCATION NEEDS:   Education needs have been addressed  Skin:  Skin Assessment: Skin Integrity Issues: Skin Integrity Issues:: Other (Comment), Incisions Incisions: lt head Other: MASD bilateral buttocks  Last BM:  03/06/19  Height:   Ht Readings from Last 1 Encounters:  03/06/19 6' (1.829 m)    Weight:   Wt Readings from Last 1 Encounters:  03/07/19 64.4 kg    Ideal Body Weight:  80.9 kg  BMI:  Body mass index is 19.26 kg/m.  Estimated Nutritional Needs:   Kcal:  1700-1900  Protein:  80-95 grams  Fluid:  > 1.7 L    Demarri Elie A. Mayford KnifeWilliams, RD, LDN, CDCES Registered Dietitian II Certified Diabetes Care and Education Specialist Pager: [phone] After hours Pager: [phone]

## 2019-03-07 NOTE — Plan of Care (Signed)
  Problem: Education: Goal: Ability to demonstrate management of disease process will improve Outcome: Progressing Goal: Ability to verbalize understanding of medication therapies will improve Outcome: Progressing Goal: Individualized Educational Video(s) Outcome: Not Met (add Reason)   Problem: Activity: Goal: Capacity to carry out activities will improve Outcome: Progressing   Problem: Cardiac: Goal: Ability to achieve and maintain adequate cardiopulmonary perfusion will improve Outcome: Progressing   Problem: Education: Goal: Knowledge of General Education information will improve Description: Including pain rating scale, medication(s)/side effects and non-pharmacologic comfort measures Outcome: Progressing   Problem: Health Behavior/Discharge Planning: Goal: Ability to manage health-related needs will improve Outcome: Progressing   Problem: Clinical Measurements: Goal: Ability to maintain clinical measurements within normal limits will improve Outcome: Progressing Goal: Will remain free from infection Outcome: Progressing Goal: Diagnostic test results will improve Outcome: Progressing Goal: Respiratory complications will improve Outcome: Progressing Goal: Cardiovascular complication will be avoided Outcome: Progressing   Problem: Activity: Goal: Risk for activity intolerance will decrease Outcome: Progressing   Problem: Nutrition: Goal: Adequate nutrition will be maintained Outcome: Progressing   Problem: Coping: Goal: Level of anxiety will decrease Outcome: Progressing   Problem: Pain Managment: Goal: General experience of comfort will improve Outcome: Progressing   Problem: Elimination: Goal: Will not experience complications related to bowel motility Outcome: Progressing Goal: Will not experience complications related to urinary retention Outcome: Progressing   Problem: Skin Integrity: Goal: Risk for impaired skin integrity will decrease Outcome:  Progressing   Problem: Safety: Goal: Ability to remain free from injury will improve Outcome: Progressing

## 2019-03-08 LAB — BASIC METABOLIC PANEL
Anion gap: 9 (ref 5–15)
BUN: 14 mg/dL (ref 8–23)
CO2: 25 mmol/L (ref 22–32)
Calcium: 8.7 mg/dL — ABNORMAL LOW (ref 8.9–10.3)
Chloride: 101 mmol/L (ref 98–111)
Creatinine, Ser: 1.19 mg/dL (ref 0.61–1.24)
GFR calc Af Amer: >60 mL/min (ref >60)
GFR calc non Af Amer: 56 mL/min — ABNORMAL LOW (ref >60)
Glucose, Bld: 90 mg/dL (ref 70–99)
Potassium: 4.5 mmol/L (ref 3.5–5.1)
Sodium: 135 mmol/L (ref 135–145)

## 2019-03-08 NOTE — Progress Notes (Signed)
Progress Note  Patient Name: Dan Perez Date of Encounter: 03/08/2019  Primary Cardiologist: Prentice DockerSuresh Koneswaran, MD   Subjective   No issues overnight. Plan for structural heart team evaluation for syncope in the setting of severe AS tomorrow.  Inpatient Medications    Scheduled Meds: . amLODipine  5 mg Oral Daily  . memantine  28 mg Oral QHS   And  . donepezil  10 mg Oral QHS  . enoxaparin (LOVENOX) injection  40 mg Subcutaneous Q24H  . feeding supplement (ENSURE ENLIVE)  237 mL Oral BID BM  . multivitamin with minerals  1 tablet Oral Daily  . sertraline  50 mg Oral Daily  . sodium chloride flush  3 mL Intravenous Q12H   Continuous Infusions: . sodium chloride     PRN Meds: sodium chloride, acetaminophen, ondansetron (ZOFRAN) IV, sodium chloride flush   Vital Signs    Vitals:   03/07/19 2339 03/08/19 0408 03/08/19 0412 03/08/19 0901  BP: (!) 147/81 (!) 170/97 (!) 176/85 (!) 141/68  Pulse: 64 (!) 54 (!) 57 67  Resp: 18 16    Temp: 99.5 F (37.5 C) 98.1 F (36.7 C)    TempSrc: Oral Oral    SpO2: 95% 99%  98%  Weight:   63.2 kg   Height:        Intake/Output Summary (Last 24 hours) at 03/08/2019 1113 Last data filed at 03/08/2019 0905 Gross per 24 hour  Intake 720 ml  Output 1700 ml  Net -980 ml   Last 3 Weights 03/08/2019 03/07/2019 03/06/2019  Weight (lbs) 139 lb 4.8 oz 142 lb 141 lb  Weight (kg) 63.186 kg 64.411 kg 63.957 kg      Telemetry    Normal sinus rhythm- Personally Reviewed  ECG    N/A - Personally Reviewed  Physical Exam  Exam unchanged on 6/21  GEN: No acute distress.  Elderly Neck: No JVD, left-sided facial wounds noted Cardiac: RRR, 3/6 systolic murmur, no rubs, or gallops.  Respiratory: Clear to auscultation bilaterally. GI: Soft, nontender, non-distended  MS: No edema; No deformity. Neuro:  Nonfocal  Psych: Normal affect   Labs    Chemistry Recent Labs  Lab 03/02/19 0000 03/08/19 0820  NA 136 135  K 4.4 4.5  CL  97 101  CO2 25 25  GLUCOSE 96 90  BUN 17 14  CREATININE 1.39* 1.19  CALCIUM 8.8 8.7*  GFRNONAA 46* 56*  GFRAA 53* >60  ANIONGAP  --  9     Hematology Recent Labs  Lab 03/02/19 0000 03/07/19 0427  WBC WILL FOLLOW 6.5  RBC WILL FOLLOW 3.75*  HGB WILL FOLLOW 10.7*  HCT WILL FOLLOW 34.1*  MCV WILL FOLLOW 90.9  MCH WILL FOLLOW 28.5  MCHC WILL FOLLOW 31.4  RDW WILL FOLLOW 13.6  PLT WILL FOLLOW 178    Cardiac Enzymes Recent Labs  Lab 03/06/19 2042  TROPONINI 0.03*   No results for input(s): TROPIPOC in the last 168 hours.   BNPNo results for input(s): BNP, PROBNP in the last 168 hours.   DDimer No results for input(s): DDIMER in the last 168 hours.   Radiology    No results found.  Cardiac Studies   Echo with severe aortic stenosis  Patient Profile     85 y.o. male progressive severe aortic stenosis moderate mitral regurgitation coronary artery calcium, syncopal episodes  Assessment & Plan    Severe aortic stenosis with syncope and fall - here to be evaluated by valve team.  This will take place on Monday.  So far appears stable.  Had wounds noted.  Very pleasant, polite. Doesn't remember fall.  In speaking with his daughter Dan Perez [phone], he does have some gait instability. - Certainly syncope can be associated with severe aortic stenosis and warrants a poor prognosis. -COVID-19 negative. -EKG shows sinus rhythm.  Dr. Myles Gip note reviewed.  Mild low level troponin elevation, myocardial injury in the setting of aortic stenosis.  Chronic kidney disease stage III- avoid NSAIDs. Creatinine improved today at 1.19.  For questions or updates, please contact [place] Please consult [email or web address] for contact info under     Pixie Casino, MD, FACC, [place] Director of the [facility] Diplomate of the American Board of Clinical Lipidology Attending  Cardiologist  Direct Dial: [phone]  Fax: [phone]  Website:  [email or web address]  Pixie Casino, MD  03/08/2019, 11:13 AM

## 2019-03-09 LAB — BASIC METABOLIC PANEL
Anion gap: 6 (ref 5–15)
BUN: 20 mg/dL (ref 8–23)
CO2: 29 mmol/L (ref 22–32)
Calcium: 8.7 mg/dL — ABNORMAL LOW (ref 8.9–10.3)
Chloride: 98 mmol/L (ref 98–111)
Creatinine, Ser: 1.32 mg/dL — ABNORMAL HIGH (ref 0.61–1.24)
GFR calc Af Amer: 57 mL/min — ABNORMAL LOW (ref >60)
GFR calc non Af Amer: 49 mL/min — ABNORMAL LOW (ref >60)
Glucose, Bld: 94 mg/dL (ref 70–99)
Potassium: 5.2 mmol/L — ABNORMAL HIGH (ref 3.5–5.1)
Sodium: 133 mmol/L — ABNORMAL LOW (ref 135–145)

## 2019-03-09 MED ORDER — SODIUM CHLORIDE 0.9% FLUSH
3.0000 mL | Freq: Two times a day (BID) | INTRAVENOUS | Status: DC
  Administered 2019-03-10 – 2019-03-16 (×11): 3 mL via INTRAVENOUS

## 2019-03-09 MED ORDER — SODIUM CHLORIDE 0.9 % IV SOLN: 250.0000 mL | INTRAVENOUS | Status: DC | PRN

## 2019-03-09 MED ORDER — SODIUM CHLORIDE 0.9 % WEIGHT BASED INFUSION
3.0000 mL/kg/h | INTRAVENOUS | Status: DC
  Administered 2019-03-10: 3 mL/kg/h via INTRAVENOUS

## 2019-03-09 MED ORDER — SODIUM CHLORIDE 0.9% FLUSH: 3.0000 mL | INTRAVENOUS | Status: DC | PRN

## 2019-03-09 MED ORDER — SODIUM CHLORIDE 0.9 % WEIGHT BASED INFUSION
1.0000 mL/kg/h | INTRAVENOUS | Status: DC
  Administered 2019-03-10: 1 mL/kg/h via INTRAVENOUS

## 2019-03-09 MED ORDER — ASPIRIN 81 MG PO CHEW
81.0000 mg | CHEWABLE_TABLET | ORAL | Status: AC
  Administered 2019-03-10: 81 mg via ORAL
  Filled 2019-03-09: qty 1

## 2019-03-09 NOTE — Progress Notes (Signed)
   Progress Note  Patient Name: Dan Perez Date of Encounter: 03/09/2019  Primary Cardiologist: Kate Sable, MD   Subjective   Feels [place].  No SHOB, lightheadedness this AM.  Inpatient Medications    Scheduled Meds: . amLODipine  5 mg Oral Daily  . memantine  28 mg Oral QHS   And  . donepezil  10 mg Oral QHS  . enoxaparin (LOVENOX) injection  40 mg Subcutaneous Q24H  . feeding supplement (ENSURE ENLIVE)  237 mL Oral BID BM  . multivitamin with minerals  1 tablet Oral Daily  . sertraline  50 mg Oral Daily  . sodium chloride flush  3 mL Intravenous Q12H   Continuous Infusions: . sodium chloride     PRN Meds: sodium chloride, acetaminophen, ondansetron (ZOFRAN) IV, sodium chloride flush   Vital Signs    Vitals:   03/08/19 1429 03/08/19 2107 03/09/19 0525 03/09/19 0821  BP: (!) 151/85 138/73 (!) 183/89 (!) 150/66  Pulse: 66 67 60 73  Resp: 20 18 19 18   Temp: 98.4 F (36.9 C) 99.1 F (37.3 C) 98 F (36.7 C) 97.8 F (36.6 C)  TempSrc: Oral Oral Oral Oral  SpO2: 98% 97% 96% 96%  Weight:   63.4 kg   Height:        Intake/Output Summary (Last 24 hours) at 03/09/2019 0938 Last data filed at 03/09/2019 8841 Gross per 24 hour  Intake 720 ml  Output 2300 ml  Net -1580 ml   Last 3 Weights 03/09/2019 03/08/2019 03/07/2019  Weight (lbs) 139 lb 12.8 oz 139 lb 4.8 oz 142 lb  Weight (kg) 63.413 kg 63.186 kg 64.411 kg      Telemetry    NSR - Personally Reviewed  ECG    Normal ECG - Personally Reviewed  Physical Exam   GEN: No acute distress.   Neck: No JVD Cardiac: RRR, 2/6 systolic murmur, no rubs, or gallops.  Respiratory: Clear to auscultation bilaterally. GI: Soft, nontender, non-distended  MS: No edema; Bruising on left face Neuro:  Nonfocal  Psych: Normal affect   Labs    Chemistry Recent Labs  Lab 03/08/19 0820 03/09/19 0608  NA 135 133*  K 4.5 5.2*  CL 101 98  CO2 25 29  GLUCOSE 90 94  BUN 14 20  CREATININE 1.19 1.32*  CALCIUM  8.7* 8.7*  GFRNONAA 56* 49*  GFRAA >60 57*  ANIONGAP 9 6     Hematology Recent Labs  Lab 03/07/19 0427  WBC 6.5  RBC 3.75*  HGB 10.7*  HCT 34.1*  MCV 90.9  MCH 28.5  MCHC 31.4  RDW 13.6  PLT 178    Cardiac Enzymes Recent Labs  Lab 03/06/19 2042  TROPONINI 0.03*   No results for input(s): TROPIPOC in the last 168 hours.   BNPNo results for input(s): BNP, PROBNP in the last 168 hours.   DDimer No results for input(s): DDIMER in the last 168 hours.   Radiology    No results found.  Cardiac Studies   Echo: normal LV function,  Severe AS.  Patient Profile     85 y.o. male with severe AS  Assessment & Plan    1) Syncope. Severe AS.  Plan for TAVR eval today.  Appears euvolemic.  For questions or updates, please contact [place] Please consult [email or web address] for contact info under        Signed, Larae Grooms, MD  03/09/2019, 9:38 AM

## 2019-03-09 NOTE — Care Management Important Message (Signed)
Important Message  Patient Details  Name: Dan Perez MRN: [id number] Date of Birth: 1931/12/19   Medicare Important Message Given:  Yes     Shelda Altes 03/09/2019, 12:47 PM

## 2019-03-09 NOTE — Consult Note (Addendum)
[facility]   MULTIDISCIPLINARY HEART VALVE TEAM  Inpatient TAVR Consultation:   Patient ID: Dan Perez; [id number]; 11-16-1931   Admit date: 03/06/2019 Date of Consult: 03/09/2019  Primary Care Provider: Dettinger, Elige RadonJoshua A, MD Primary Cardiologist: Dr. Purvis SheffieldKoneswaran  Patient Profile:   Dan Perez is a 85 y.o. male with a hx of pectus excavatum, scoliosis, HTN, AAA, CKD stage III, anemia, delusion disorder, dementia, thyroidectomy w/ iatrogenic hypothyroidism, moderate MR and severe aortic stenosis who was admitted with syncope who is being seen today for the evaluation of severe AS at the request of Dr. Diona BrownerMcDowell.  History of Present Illness:   Dan Perez has had a longstanding murmur since at least the 1950s found during an [employer]. He was seen by Dr. Diona BrownerMcDowell in 2015 for a preoperative assessment and echo showed mild AS. Echocardiogram 04/30/17 demonstrated normal left ventricular systolic function and regional wall motion, LVEF 60-65%, mild LVH, grade 1 diastolic dysfunction, mod MR, moderate aortic stenosis with a mean gradient of 23 mmHg and peak velocity of 326 cm/s, and mild to moderate aortic regurgitation. There was mild mitral regurgitation, and elevated central venous pressures. He was referred back to Dr. Purvis SheffieldKoneswaran in 05/2017 for evaluation of an "abnormal echo". Plan was to continue to monitor his aortic stenosis.   He was evaluated in the ER in 06/2018 for chest pain. Blood pressure was markedly elevated at 197/100. It was felt musculoskeletal in etiology and he was prescribed tramadol.  Troponins were normal. He was mildly anemic, hemoglobin 11.6.    His most recent echocardiogram in 01/2019 showed progression of his aortic stenosis to severe.  His mean gradient was only 24 mmHg however he had an aortic valve area of 0.75 cm, DVI of 0.23, and stroke-volume index of 31.93 consistent with severe aortic stenosis.  He was seen by Dr. Purvis SheffieldKoneswaran as  a virtual visit on 02/24/2019 for evaluation of worsening dyspnea on exertion as well as sudden, short spells of weakness and paleness, per daughter. The patient did not have any recollection of these spells occurring other than his daughter telling him they occur.  Given progressive symptoms and severe aortic stenosis, he was referred to Dr. Clifton JamesMcAlhany for TAVR evaluation on 03/23/2019. Of note, he also had been markedly hypertensive at home and during office evaluations and his lisinopril was titrated.  He was continued on his Lasix.  On the day of admission, he was sitting in the car coming home from grocery shopping with his daughter. He got out of the car and woke up on the ground with contusion/lacertaiton on his face. In that setting, [facility].  In the emergency department he was found to be in sinus rhythm.  Lab work was relatively unremarkable although his creatinine was slightly elevated at 1.48.  Troponin was mildly elevated at 0.02.  Head CT did not show any acute intracranial abnormality.  There was noted to be a soft tissue hematoma overlying the left lower frontal bone and orbit, maxilla, and zygoma without fracture.  Laceration was treated with suturing.  He was hypertensive with systolic blood pressure in the 160s.  Given his history of severe aortic stenosis, it was decided to transfer [facility] and treatment.  The structural heart team has been consulted for consideration of TAVR.  Today the patient is sitting comfortably in bed.  He is a difficult historian but able to provide some history.  He  worked for over 30 years in the [employer] and retired about 10 years ago.  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  He stopped driving around 3 or 4 years ago due to letting his driver's license lapse.  He does walk without the aid of a cane or walker; however, his daughter does tell  me he does have some gait instability.  He has full dentures.  He denies chest pain, shortness of breath or orthopnea.  He has chronic lower extremity edema which is stable.  He occasionally has dizziness and has had had syncope as outlined above.  Of note, the H&P also outlines a previous fall 2 days prior to his fall prompting admission.  The patient and daughter have no recollection of this.  His daughter does admit to some degree of dementia stating that the patient has good and bad days.    Past Medical History:  Diagnosis Date  . AAA (abdominal aortic aneurysm) without rupture ([place])    a. [place] (11/03/2013): 3.9 x 3.3 cm; b. 01/2018 CT chest: mild dil of Abd Ao- 3.9cm.  . Chronic abdominal pain   . CKD (chronic kidney disease), stage III ([place])   . Coronary artery calcification    a. 01/2018 Chest CT: Cor Ca2+ noted.  . Delusional disorder ([place])    chronic  . Essential hypertension, benign   . History of goiter   . Paranoia ([place])   . Right inguinal hernia    to see Dr. Romona Curls for repair  . Severe aortic stenosis    a. 06/2018 Echo: EF 60-65%, Gr1 DD. Mod to sev AS. At least mod MR; b. 01/2019 Echo: EF 55-60%. No rwma. Nl RV fxn. Sev AoV Ca2+ w/ Sev AS. Mean grad only 38mmHg but AoV area 0.75cm^2 (Vmax).    Past Surgical History:  Procedure Laterality Date  . CATARACT EXTRACTION, BILATERAL    . EYE SURGERY Left 2004   Cataract   . EYE SURGERY Right 2004   Cataract   . INGUINAL HERNIA REPAIR Right 12/17/2013   Procedure: HERNIA REPAIR INGUINAL ADULT;  Surgeon: Scherry Ran, MD;  Location: [facility];  Service: General;  Laterality: Right;  . THYROIDECTOMY  03/02/2011   Left lobectomy and isthmusectomy -      Inpatient Medications: Scheduled Meds: . amLODipine  5 mg Oral Daily  . memantine  28 mg Oral QHS   And  . donepezil  10 mg Oral QHS  . enoxaparin (LOVENOX) injection  40 mg Subcutaneous Q24H  . feeding supplement (ENSURE ENLIVE)  237 mL Oral BID BM  . multivitamin with  minerals  1 tablet Oral Daily  . sertraline  50 mg Oral Daily  . sodium chloride flush  3 mL Intravenous Q12H   Continuous Infusions: . sodium chloride     PRN Meds: sodium chloride, acetaminophen, ondansetron (ZOFRAN) IV, sodium chloride flush  Allergies:   No Known Allergies  Social History:   Social History   Socioeconomic History  . Marital status: Divorced    Spouse name: Not on file  . Number of children: 5  . Years of education: 23  . Highest education level: Associate degree: occupational, Hotel manager, or vocational program  Occupational History  . Not on file  Social Needs  . Financial resource strain: Not very hard  . Food insecurity    Worry: Never true    Inability: Never true  . Transportation needs    Medical:  No    Non-medical: No  Tobacco Use  . Smoking status: Former Smoker    Quit date: 08/12/1983    Years since quitting: 35.5  . Smokeless tobacco: Never Used  Substance and Sexual Activity  . Alcohol use: No  . Drug use: No  . Sexual activity: Not Currently  Lifestyle  . Physical activity    Days per week: 0 days    Minutes per session: 0 min  . Stress: Only a little  Relationships  . Social connections    Talks on phone: More than three times a week    Gets together: More than three times a week    Attends religious service: 1 to 4 times per year    Active member of club or organization: No    Attends meetings of clubs or organizations: Never    Relationship status: Divorced  . Intimate partner violence    Fear of current or ex partner: No    Emotionally abused: No    Physically abused: No    Forced sexual activity: No  Other Topics Concern  . Not on file  Social History Narrative   Patient is divorced and lives in a one story home by himself. He has neighbors that visit and his children visit often and take him out to eat. He is retired and spent about 8 years in the [employer]. He is able to read/write and speak Japanese.     Family  History:   The patient's family history includes Alzheimer's disease in his mother and sister; Cancer in his brother; Dementia in his mother; Diabetes in his brother, daughter, and mother; Heart attack in his brother and father; Hyperlipidemia in his daughter, daughter, and son; Hypertension in his daughter and son; Stroke in his mother; Thyroid cancer in his father and mother; Varicose Veins in his daughter and mother.  ROS:  Please see the history of present illness.  ROS  All other ROS reviewed and negative.     Physical Exam/Data:   Vitals:   03/08/19 2107 03/09/19 0525 03/09/19 0821 03/09/19 1104  BP: 138/73 (!) 183/89 (!) 150/66 133/85  Pulse: 67 60 73 70  Resp: 18 19 18   Temp: 99.1 F (37.3 C) 98 F (36.7 C) 97.8 F (36.6 C) 98.2 F (36.8 C)  TempSrc: Oral Oral Oral Oral  SpO2: 97% 96% 96% 96%  Weight:  63.4 kg    Height:        Intake/Output Summary (Last 24 hours) at 03/09/2019 1218 Last data filed at 03/09/2019 0822 Gross per 24 hour  Intake 720 ml  Output 1700 ml  Net -980 ml   Filed Weights   03/07/19 0533 03/08/19 0412 03/09/19 0525  Weight: 64.4 kg 63.2 kg 63.4 kg   Body mass index is 18.96 kg/m.  General: elderly and frail appearing HEENT: left sided facial contusion and sutured head lac Lymph: no adenopathy Neck: no JVD Endocrine:  No thryomegaly Cardiac:  normal S1, S2; RRR; 3/6 SEM  Lungs:  clear to auscultation bilaterally, no wheezing, rhonchi or rales  Abd: soft, nontender, no hepatomegaly  Ext: 1+ bilateral LE edema L>R Musculoskeletal:  No deformities, BUE and BLE strength normal and equal Skin: warm and dry  Neuro:  CNs 2-12 intact, no focal abnormalities noted Psych:  Normal affect   EKG:  The EKG was personally reviewed and demonstrates:  NSR, LAD, 60bpm Telemetry:  Telemetry was personally reviewed and demonstrates:  sinus  Relevant CV Studies: Echocardiogram   01/28/19: 1. The left ventricle has normal systolic function, with an  ejection fraction of 55-60%. The cavity size was normal. There is mildly increased left ventricular wall thickness. Left ventricular diastolic Doppler parameters are consistent with impaired  relaxation. No evidence of left ventricular regional wall motion abnormalities. 2. The right ventricle has normal systolic function. The cavity was normal. There is no increase in right ventricular wall thickness. 3. Small pericardial effusion. 4. The pericardial effusion is anterior to the right ventricle. Also possible left pleural effusion. 5. The aortic valve has an indeterminate number of cusps. Severe calcifcation of the aortic valve. Suspect evere stenosis of the aortic valve. Mean gradient is only 24 mmHg, but the calculated valve area and dimentionless index both are consistent with  severe stenosis. Moderate aortic annular calcification noted. 6. The mitral valve is degenerative. Mild thickening of the mitral valve leaflet. There is mild mitral annular calcification present. Mitral valve regurgitation is moderate by color flow Doppler. 7. The tricuspid valve is grossly normal. 8. The aortic root is normal in size and structure  Laboratory Data:  Chemistry Recent Labs  Lab 03/08/19 0820 03/09/19 0608  NA 135 133*  K 4.5 5.2*  CL 101 98  CO2 25 29  GLUCOSE 90 94  BUN 14 20  CREATININE 1.19 1.32*  CALCIUM 8.7* 8.7*  GFRNONAA 56* 49*  GFRAA >60 57*  ANIONGAP 9 6    No results for input(s): PROT, ALBUMIN, AST, ALT, ALKPHOS, BILITOT in the last 168 hours. Hematology Recent Labs  Lab 03/07/19 0427  WBC 6.5  RBC 3.75*  HGB 10.7*  HCT 34.1*  MCV 90.9  MCH 28.5  MCHC 31.4  RDW 13.6  PLT 178   Cardiac Enzymes Recent Labs  Lab 03/06/19 2042  TROPONINI 0.03*   No results for input(s): TROPIPOC in the last 168 hours.  BNPNo results for input(s): BNP, PROBNP in the last 168 hours.  DDimer No results for input(s): DDIMER in the last 168 hours.  Radiology/Studies:  No  results found.   STS Risk Calculator: Procedure: Isolated AVR  Risk of Mortality:  3.254%   Renal Failure:  3.751%   Permanent Stroke:  1.551%   Prolonged Ventilation:  9.422%   DSW Infection:  0.149%   Reoperation:  3.907%   Morbidity or Mortality:  14.898%   Short Length of Stay:  28.230%   Long Length of Stay:  9.010%    Assessment and Plan:   Dan Perez is a 85 y.o. male with symptoms of severe, stage D3 aortic stenosis with NYHA Class IV symptoms with frank syncope and worsening dyspnea on exertion. I have reviewed the patient's recent echocardiogram which is notable for preserved LV systolic function and severe aortic stenosis with mean gradient of 24 mm Hg and peak transvalvular gradient of 41.9 mmHg. The patient's dimensionless index is 0.23 and calculated aortic valve area is 0.75 cm. Stroke volume index 31.   I have reviewed the natural history of aortic stenosis with the patient. We have discussed the limitations of medical therapy and the poor prognosis associated with symptomatic aortic stenosis. We have reviewed potential treatment options, including palliative medical therapy, conventional surgical aortic valve replacement, and transcatheter aortic valve replacement. We discussed treatment options in the context of this patient's specific comorbid medical conditions.    The patient's predicted risk of mortality with conventional aortic valve replacement is 3.254% primarily based on age, HTN, CKD, syncope, anemia, AAA, mod MR. Other significant comorbid conditions include  pectus excavatum and dementia. 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 TAVR seems like a reasonable treatment option for this patient pending formal cardiac surgical consultation. We discussed typical evaluation which will require a gated cardiac CTA and a CTA of the chest/abdomen/pelvis to evaluate  both his cardiac anatomy and peripheral vasculature. Follow-up testing will be arranged as an outpatient. Will try to do [facility] before discharge.     Signed, Cline CrockKathryn Thompson, PA-C  03/09/2019 12:18 PM   I have personally seen and examined this patient with Cline CrockKathryn Thompson, PA-C. I agree with the assessment and plan as outlined above. Dan Perez is a 85 yo male with severe AS, AAA, dementia, HTN, CKD and moderate MR admitted following a syncopal episode. His AS has been followed by DR. Koneswaran. He has had several recent syncopal events. Echo this admission with normal LV systolic function with heavily calcified aortic valve leaflets with limited leaflet mobility. The mean gradient is only 24 mmHg but his AVA is 0.74 cm2 and the dimensionless index is 0.23. This is consistent with severe AS. There is moderate MR. I have personally reviewed his echo images.  He denies any chest pain or dyspnea but is a poor historian  I have personally reviewed the EKG which shows NSR with an old septal infarct.  Labs reviewed by me.   My exam:  General: Thin elderly male. Abrasion and bruising on forehead HEENT: OP clear, mucus membranes moist  SKIN: warm, dry. No rashes. Neuro: No focal deficits  Musculoskeletal: Muscle strength 5/5 all ext  Psychiatric: Mood and affect normal  Neck: No JVD, no carotid bruits, no thyromegaly, no lymphadenopathy.  Lungs:Clear bilaterally, no wheezes, rhonci, crackles Cardiovascular: Regular rate and rhythm. Loud, harsh systolic murmur.  Abdomen:Soft. Bowel sounds present. Non-tender.  Extremities: No lower extremity edema. Pulses are 2 + in the bilateral DP/PT.  Plan:  1. Severe aortic stenosis: He has severe, stage D aortic valve stenosis. His syncopal events are most likely related to his AS. I have personally reviewed the echo images. The aortic valve is thickened, calcified with limited leaflet mobility. I think he would benefit from AVR. Given advanced age, he is not  a good candidate for conventional AVR by surgical approach. I think he may be a candidate for TAVR.  I have reviewed the natural history of aortic stenosis with the patient and their family members  who are present today. We have discussed the limitations of medical therapy and the poor prognosis associated with symptomatic aortic stenosis. We have reviewed potential treatment options, including palliative medical therapy, conventional surgical aortic valve replacement, and transcatheter aortic valve replacement. We discussed treatment options in the context of the patient's specific comorbid medical conditions.  He would like to proceed with planning for TAVR. I will arrange a right and left heart catheterization tomorrow morning. Risks and benefits of the cath procedure reviewed with the patient. After the cath, he will have a gated cardiac CT and CTA of the chest/abdomen and pelvis pending his renal function. We may be able to get some of his testing completed while he is an inpatient. Will review case at TAVR meeting in am and see if one of our CT surgeons can see him before he goes home.   Verne CarrowChristopher Sayla Perez 03/09/2019  4:55PM

## 2019-03-09 NOTE — H&P (View-Only) (Signed)
[facility]   MULTIDISCIPLINARY HEART VALVE TEAM  Inpatient TAVR Consultation:   Patient ID: Dan Perez; [id number]; 11-16-1931   Admit date: 03/06/2019 Date of Consult: 03/09/2019  Primary Care Provider: Dettinger, Elige RadonJoshua A, MD Primary Cardiologist: Dr. Purvis SheffieldKoneswaran  Patient Profile:   Dan Perez is a 85 y.o. male with a hx of pectus excavatum, scoliosis, HTN, AAA, CKD stage III, anemia, delusion disorder, dementia, thyroidectomy w/ iatrogenic hypothyroidism, moderate MR and severe aortic stenosis who was admitted with syncope who is being seen today for the evaluation of severe AS at the request of Dr. Diona BrownerMcDowell.  History of Present Illness:   Dan Perez has had a longstanding murmur since at least the 1950s found during an [employer]. He was seen by Dr. Diona BrownerMcDowell in 2015 for a preoperative assessment and echo showed mild AS. Echocardiogram 04/30/17 demonstrated normal left ventricular systolic function and regional wall motion, LVEF 60-65%, mild LVH, grade 1 diastolic dysfunction, mod MR, moderate aortic stenosis with a mean gradient of 23 mmHg and peak velocity of 326 cm/s, and mild to moderate aortic regurgitation. There was mild mitral regurgitation, and elevated central venous pressures. He was referred back to Dr. Purvis SheffieldKoneswaran in 05/2017 for evaluation of an "abnormal echo". Plan was to continue to monitor his aortic stenosis.   He was evaluated in the ER in 06/2018 for chest pain. Blood pressure was markedly elevated at 197/100. It was felt musculoskeletal in etiology and he was prescribed tramadol.  Troponins were normal. He was mildly anemic, hemoglobin 11.6.    His most recent echocardiogram in 01/2019 showed progression of his aortic stenosis to severe.  His mean gradient was only 24 mmHg however he had an aortic valve area of 0.75 cm, DVI of 0.23, and stroke-volume index of 31.93 consistent with severe aortic stenosis.  He was seen by Dr. Purvis SheffieldKoneswaran as  a virtual visit on 02/24/2019 for evaluation of worsening dyspnea on exertion as well as sudden, short spells of weakness and paleness, per daughter. The patient did not have any recollection of these spells occurring other than his daughter telling him they occur.  Given progressive symptoms and severe aortic stenosis, he was referred to Dr. Clifton JamesMcAlhany for TAVR evaluation on 03/23/2019. Of note, he also had been markedly hypertensive at home and during office evaluations and his lisinopril was titrated.  He was continued on his Lasix.  On the day of admission, he was sitting in the car coming home from grocery shopping with his daughter. He got out of the car and woke up on the ground with contusion/lacertaiton on his face. In that setting, [facility].  In the emergency department he was found to be in sinus rhythm.  Lab work was relatively unremarkable although his creatinine was slightly elevated at 1.48.  Troponin was mildly elevated at 0.02.  Head CT did not show any acute intracranial abnormality.  There was noted to be a soft tissue hematoma overlying the left lower frontal bone and orbit, maxilla, and zygoma without fracture.  Laceration was treated with suturing.  He was hypertensive with systolic blood pressure in the 160s.  Given his history of severe aortic stenosis, it was decided to transfer [facility] and treatment.  The structural heart team has been consulted for consideration of TAVR.  Today the patient is sitting comfortably in bed.  He is a difficult historian but able to provide some history.  He  worked for over 30 years in the [employer] and retired about 10 years ago.  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  He stopped driving around 3 or 4 years ago due to letting his driver's license lapse.  He does walk without the aid of a cane or walker; however, his daughter does tell  me he does have some gait instability.  He has full dentures.  He denies chest pain, shortness of breath or orthopnea.  He has chronic lower extremity edema which is stable.  He occasionally has dizziness and has had had syncope as outlined above.  Of note, the H&P also outlines a previous fall 2 days prior to his fall prompting admission.  The patient and daughter have no recollection of this.  His daughter does admit to some degree of dementia stating that the patient has good and bad days.    Past Medical History:  Diagnosis Date  . AAA (abdominal aortic aneurysm) without rupture ([place])    a. [place] (11/03/2013): 3.9 x 3.3 cm; b. 01/2018 CT chest: mild dil of Abd Ao- 3.9cm.  . Chronic abdominal pain   . CKD (chronic kidney disease), stage III ([place])   . Coronary artery calcification    a. 01/2018 Chest CT: Cor Ca2+ noted.  . Delusional disorder ([place])    chronic  . Essential hypertension, benign   . History of goiter   . Paranoia ([place])   . Right inguinal hernia    to see Dr. Romona Curls for repair  . Severe aortic stenosis    a. 06/2018 Echo: EF 60-65%, Gr1 DD. Mod to sev AS. At least mod MR; b. 01/2019 Echo: EF 55-60%. No rwma. Nl RV fxn. Sev AoV Ca2+ w/ Sev AS. Mean grad only 38mmHg but AoV area 0.75cm^2 (Vmax).    Past Surgical History:  Procedure Laterality Date  . CATARACT EXTRACTION, BILATERAL    . EYE SURGERY Left 2004   Cataract   . EYE SURGERY Right 2004   Cataract   . INGUINAL HERNIA REPAIR Right 12/17/2013   Procedure: HERNIA REPAIR INGUINAL ADULT;  Surgeon: Scherry Ran, MD;  Location: [facility];  Service: General;  Laterality: Right;  . THYROIDECTOMY  03/02/2011   Left lobectomy and isthmusectomy -      Inpatient Medications: Scheduled Meds: . amLODipine  5 mg Oral Daily  . memantine  28 mg Oral QHS   And  . donepezil  10 mg Oral QHS  . enoxaparin (LOVENOX) injection  40 mg Subcutaneous Q24H  . feeding supplement (ENSURE ENLIVE)  237 mL Oral BID BM  . multivitamin with  minerals  1 tablet Oral Daily  . sertraline  50 mg Oral Daily  . sodium chloride flush  3 mL Intravenous Q12H   Continuous Infusions: . sodium chloride     PRN Meds: sodium chloride, acetaminophen, ondansetron (ZOFRAN) IV, sodium chloride flush  Allergies:   No Known Allergies  Social History:   Social History   Socioeconomic History  . Marital status: Divorced    Spouse name: Not on file  . Number of children: 5  . Years of education: 23  . Highest education level: Associate degree: occupational, Hotel manager, or vocational program  Occupational History  . Not on file  Social Needs  . Financial resource strain: Not very hard  . Food insecurity    Worry: Never true    Inability: Never true  . Transportation needs    Medical:  No    Non-medical: No  Tobacco Use  . Smoking status: Former Smoker    Quit date: 08/12/1983    Years since quitting: 35.5  . Smokeless tobacco: Never Used  Substance and Sexual Activity  . Alcohol use: No  . Drug use: No  . Sexual activity: Not Currently  Lifestyle  . Physical activity    Days per week: 0 days    Minutes per session: 0 min  . Stress: Only a little  Relationships  . Social connections    Talks on phone: More than three times a week    Gets together: More than three times a week    Attends religious service: 1 to 4 times per year    Active member of club or organization: No    Attends meetings of clubs or organizations: Never    Relationship status: Divorced  . Intimate partner violence    Fear of current or ex partner: No    Emotionally abused: No    Physically abused: No    Forced sexual activity: No  Other Topics Concern  . Not on file  Social History Narrative   Patient is divorced and lives in a one story home by himself. He has neighbors that visit and his children visit often and take him out to eat. He is retired and spent about 8 years in the [employer]. He is able to read/write and speak [place].     Family  History:   The patient's family history includes Alzheimer's disease in his mother and sister; Cancer in his brother; Dementia in his mother; Diabetes in his brother, daughter, and mother; Heart attack in his brother and father; Hyperlipidemia in his daughter, daughter, and son; Hypertension in his daughter and son; Stroke in his mother; Thyroid cancer in his father and mother; Varicose Veins in his daughter and mother.  ROS:  Please see the history of present illness.  ROS  All other ROS reviewed and negative.     Physical Exam/Data:   Vitals:   03/08/19 2107 03/09/19 0525 03/09/19 0821 03/09/19 1104  BP: 138/73 (!) 183/89 (!) 150/66 133/85  Pulse: 67 60 73 70  Resp: Temp: 99.1 F (37.3 C) 98 F (36.7 C) 97.8 F (36.6 C) 98.2 F (36.8 C)  TempSrc: Oral Oral Oral Oral  SpO2: 97% 96% 96% 96%  Weight:  63.4 kg    Height:        Intake/Output Summary (Last 24 hours) at 03/09/2019 1218 Last data filed at 03/09/2019 1443 Gross per 24 hour  Intake 720 ml  Output 1700 ml  Net -980 ml   Filed Weights   03/07/19 0533 03/08/19 0412 03/09/19 0525  Weight: 64.4 kg 63.2 kg 63.4 kg   Body mass index is 18.96 kg/m.  General: elderly and frail appearing HEENT: left sided facial contusion and sutured head lac Lymph: no adenopathy Neck: no JVD Endocrine:  No thryomegaly Cardiac:  normal S1, S2; RRR; 3/6 SEM  Lungs:  clear to auscultation bilaterally, no wheezing, rhonchi or rales  Abd: soft, nontender, no hepatomegaly  Ext: 1+ bilateral LE edema L>R Musculoskeletal:  No deformities, BUE and BLE strength normal and equal Skin: warm and dry  Neuro:  CNs 2-12 intact, no focal abnormalities noted Psych:  Normal affect   EKG:  The EKG was personally reviewed and demonstrates:  NSR, LAD, 60bpm Telemetry:  Telemetry was personally reviewed and demonstrates:  sinus  Relevant CV Studies: Echocardiogram  01/28/19: 1. The left ventricle has normal systolic function, with an  ejection fraction of 55-60%. The cavity size was normal. There is mildly increased left ventricular wall thickness. Left ventricular diastolic Doppler parameters are consistent with impaired  relaxation. No evidence of left ventricular regional wall motion abnormalities. 2. The right ventricle has normal systolic function. The cavity was normal. There is no increase in right ventricular wall thickness. 3. Small pericardial effusion. 4. The pericardial effusion is anterior to the right ventricle. Also possible left pleural effusion. 5. The aortic valve has an indeterminate number of cusps. Severe calcifcation of the aortic valve. Suspect evere stenosis of the aortic valve. Mean gradient is only 24 mmHg, but the calculated valve area and dimentionless index both are consistent with  severe stenosis. Moderate aortic annular calcification noted. 6. The mitral valve is degenerative. Mild thickening of the mitral valve leaflet. There is mild mitral annular calcification present. Mitral valve regurgitation is moderate by color flow Doppler. 7. The tricuspid valve is grossly normal. 8. The aortic root is normal in size and structure  Laboratory Data:  Chemistry Recent Labs  Lab 03/08/19 0820 03/09/19 0608  NA 135 133*  K 4.5 5.2*  CL 101 98  CO2 25 29  GLUCOSE 90 94  BUN 14 20  CREATININE 1.19 1.32*  CALCIUM 8.7* 8.7*  GFRNONAA 56* 49*  GFRAA >60 57*  ANIONGAP 9 6    No results for input(s): PROT, ALBUMIN, AST, ALT, ALKPHOS, BILITOT in the last 168 hours. Hematology Recent Labs  Lab 03/07/19 0427  WBC 6.5  RBC 3.75*  HGB 10.7*  HCT 34.1*  MCV 90.9  MCH 28.5  MCHC 31.4  RDW 13.6  PLT 178   Cardiac Enzymes Recent Labs  Lab 03/06/19 2042  TROPONINI 0.03*   No results for input(s): TROPIPOC in the last 168 hours.  BNPNo results for input(s): BNP, PROBNP in the last 168 hours.  DDimer No results for input(s): DDIMER in the last 168 hours.  Radiology/Studies:  No  results found.   STS Risk Calculator: Procedure: Isolated AVR  Risk of Mortality:  3.254%   Renal Failure:  3.751%   Permanent Stroke:  1.551%   Prolonged Ventilation:  9.422%   DSW Infection:  0.149%   Reoperation:  3.907%   Morbidity or Mortality:  14.898%   Short Length of Stay:  28.230%   Long Length of Stay:  9.010%    Assessment and Plan:   Dan Perez is a 85 y.o. male with symptoms of severe, stage D3 aortic stenosis with NYHA Class IV symptoms with frank syncope and worsening dyspnea on exertion. I have reviewed the patient's recent echocardiogram which is notable for preserved LV systolic function and severe aortic stenosis with mean gradient of 24 mm Hg and peak transvalvular gradient of 41.9 mmHg. The patient's dimensionless index is 0.23 and calculated aortic valve area is 0.75 cm. Stroke volume index 31.   I have reviewed the natural history of aortic stenosis with the patient. We have discussed the limitations of medical therapy and the poor prognosis associated with symptomatic aortic stenosis. We have reviewed potential treatment options, including palliative medical therapy, conventional surgical aortic valve replacement, and transcatheter aortic valve replacement. We discussed treatment options in the context of this patient's specific comorbid medical conditions.    The patient's predicted risk of mortality with conventional aortic valve replacement is 3.254% primarily based on age, HTN, CKD, syncope, anemia, AAA, mod MR. Other significant comorbid conditions include  pectus excavatum and dementia. 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 TAVR seems like a reasonable treatment option for this patient pending formal cardiac surgical consultation. We discussed typical evaluation which will require a gated cardiac CTA and a CTA of the chest/abdomen/pelvis to evaluate  both his cardiac anatomy and peripheral vasculature. Follow-up testing will be arranged as an outpatient. Will try to do [facility] before discharge.     Signed, Cline CrockKathryn Thompson, PA-C  03/09/2019 12:18 PM   I have personally seen and examined this patient with Cline CrockKathryn Thompson, PA-C. I agree with the assessment and plan as outlined above. Dan Perez is a 85 yo male with severe AS, AAA, dementia, HTN, CKD and moderate MR admitted following a syncopal episode. His AS has been followed by DR. Koneswaran. He has had several recent syncopal events. Echo this admission with normal LV systolic function with heavily calcified aortic valve leaflets with limited leaflet mobility. The mean gradient is only 24 mmHg but his AVA is 0.74 cm2 and the dimensionless index is 0.23. This is consistent with severe AS. There is moderate MR. I have personally reviewed his echo images.  He denies any chest pain or dyspnea but is a poor historian  I have personally reviewed the EKG which shows NSR with an old septal infarct.  Labs reviewed by me.   My exam:  General: Thin elderly male. Abrasion and bruising on forehead HEENT: OP clear, mucus membranes moist  SKIN: warm, dry. No rashes. Neuro: No focal deficits  Musculoskeletal: Muscle strength 5/5 all ext  Psychiatric: Mood and affect normal  Neck: No JVD, no carotid bruits, no thyromegaly, no lymphadenopathy.  Lungs:Clear bilaterally, no wheezes, rhonci, crackles Cardiovascular: Regular rate and rhythm. Loud, harsh systolic murmur.  Abdomen:Soft. Bowel sounds present. Non-tender.  Extremities: No lower extremity edema. Pulses are 2 + in the bilateral DP/PT.  Plan:  1. Severe aortic stenosis: He has severe, stage D aortic valve stenosis. His syncopal events are most likely related to his AS. I have personally reviewed the echo images. The aortic valve is thickened, calcified with limited leaflet mobility. I think he would benefit from AVR. Given advanced age, he is not  a good candidate for conventional AVR by surgical approach. I think he may be a candidate for TAVR.  I have reviewed the natural history of aortic stenosis with the patient and their family members  who are present today. We have discussed the limitations of medical therapy and the poor prognosis associated with symptomatic aortic stenosis. We have reviewed potential treatment options, including palliative medical therapy, conventional surgical aortic valve replacement, and transcatheter aortic valve replacement. We discussed treatment options in the context of the patient's specific comorbid medical conditions.  He would like to proceed with planning for TAVR. I will arrange a right and left heart catheterization tomorrow morning. Risks and benefits of the cath procedure reviewed with the patient. After the cath, he will have a gated cardiac CT and CTA of the chest/abdomen and pelvis pending his renal function. We may be able to get some of his testing completed while he is an inpatient. Will review case at TAVR meeting in am and see if one of our CT surgeons can see him before he goes home.   Verne CarrowChristopher McAlhany 03/09/2019  4:55PM

## 2019-03-09 NOTE — Progress Notes (Signed)
Spoke with patients daughter [name]. She stated she had not spoken to a doctor since Saturday and would like an update from someone tomorrow. She will be at work during the day tomorrow and left her direct number. [phone].

## 2019-03-10 ENCOUNTER — Inpatient Hospital Stay (HOSPITAL_COMMUNITY): Payer: PPO

## 2019-03-10 ENCOUNTER — Encounter (HOSPITAL_COMMUNITY)
Admission: AD | Disposition: A | Discharge status: Skilled Nursing Facility | Payer: Self-pay | Source: Other Acute Inpatient Hospital | Attending: Cardiovascular Disease

## 2019-03-10 ENCOUNTER — Encounter (HOSPITAL_COMMUNITY): Payer: Self-pay

## 2019-03-10 DIAGNOSIS — I350 Nonrheumatic aortic (valve) stenosis: Secondary | ICD-10-CM

## 2019-03-10 DIAGNOSIS — Z01810 Encounter for preprocedural cardiovascular examination: Secondary | ICD-10-CM

## 2019-03-10 HISTORY — PX: RIGHT HEART CATH AND CORONARY ANGIOGRAPHY: CATH118264

## 2019-03-10 LAB — BASIC METABOLIC PANEL
Anion gap: 7 (ref 5–15)
BUN: 22 mg/dL (ref 8–23)
CO2: 29 mmol/L (ref 22–32)
Calcium: 8.6 mg/dL — ABNORMAL LOW (ref 8.9–10.3)
Chloride: 97 mmol/L — ABNORMAL LOW (ref 98–111)
Creatinine, Ser: 1.27 mg/dL — ABNORMAL HIGH (ref 0.61–1.24)
GFR calc Af Amer: 60 mL/min — ABNORMAL LOW (ref >60)
GFR calc non Af Amer: 52 mL/min — ABNORMAL LOW (ref >60)
Glucose, Bld: 91 mg/dL (ref 70–99)
Potassium: 5 mmol/L (ref 3.5–5.1)
Sodium: 133 mmol/L — ABNORMAL LOW (ref 135–145)

## 2019-03-10 LAB — POCT I-STAT EG7
Acid-Base Excess: 1 mmol/L (ref 0.0–2.0)
Bicarbonate: 27.6 mmol/L (ref 20.0–28.0)
Calcium, Ion: 1.08 mmol/L — ABNORMAL LOW (ref 1.15–1.40)
HCT: 32 % — ABNORMAL LOW (ref 39.0–52.0)
Hemoglobin: 10.9 g/dL — ABNORMAL LOW (ref 13.0–17.0)
O2 Saturation: 69 %
Potassium: 4.1 mmol/L (ref 3.5–5.1)
Sodium: 139 mmol/L (ref 135–145)
TCO2: 29 mmol/L (ref 22–32)
pCO2, Ven: 49.4 mmHg (ref 44.0–60.0)
pH, Ven: 7.355 (ref 7.250–7.430)
pO2, Ven: 38 mmHg (ref 32.0–45.0)

## 2019-03-10 LAB — POCT I-STAT 7, (LYTES, BLD GAS, ICA,H+H)
Acid-Base Excess: 2 mmol/L (ref 0.0–2.0)
Bicarbonate: 27.4 mmol/L (ref 20.0–28.0)
Calcium, Ion: 1.19 mmol/L (ref 1.15–1.40)
HCT: 35 % — ABNORMAL LOW (ref 39.0–52.0)
Hemoglobin: 11.9 g/dL — ABNORMAL LOW (ref 13.0–17.0)
O2 Saturation: 91 %
Potassium: 4.4 mmol/L (ref 3.5–5.1)
Sodium: 136 mmol/L (ref 135–145)
TCO2: 29 mmol/L (ref 22–32)
pCO2 arterial: 46.7 mmHg (ref 32.0–48.0)
pH, Arterial: 7.377 (ref 7.350–7.450)
pO2, Arterial: 63 mmHg — ABNORMAL LOW (ref 83.0–108.0)

## 2019-03-10 SURGERY — RIGHT HEART CATH AND CORONARY ANGIOGRAPHY
Anesthesia: LOCAL

## 2019-03-10 MED ORDER — HEPARIN SODIUM (PORCINE) 1000 UNIT/ML IJ SOLN
INTRAMUSCULAR | Status: AC
  Filled 2019-03-10: qty 1

## 2019-03-10 MED ORDER — HEPARIN (PORCINE) IN NACL 1000-0.9 UT/500ML-% IV SOLN
INTRAVENOUS | Status: DC | PRN
  Administered 2019-03-10 (×2): 500 mL

## 2019-03-10 MED ORDER — SODIUM CHLORIDE 0.9 % IV SOLN
INTRAVENOUS | Status: AC
  Administered 2019-03-10: 12:00:00 via INTRAVENOUS

## 2019-03-10 MED ORDER — VERAPAMIL HCL 2.5 MG/ML IV SOLN
INTRAVENOUS | Status: DC | PRN
  Administered 2019-03-10: 10 mL via INTRA_ARTERIAL

## 2019-03-10 MED ORDER — MIDAZOLAM HCL 2 MG/2ML IJ SOLN
INTRAMUSCULAR | Status: DC | PRN
  Administered 2019-03-10: 1 mg via INTRAVENOUS

## 2019-03-10 MED ORDER — ENOXAPARIN SODIUM 40 MG/0.4ML ~~LOC~~ SOLN
40.0000 mg | SUBCUTANEOUS | Status: DC
  Administered 2019-03-11 – 2019-03-16 (×6): 40 mg via SUBCUTANEOUS
  Filled 2019-03-10 (×6): qty 0.4

## 2019-03-10 MED ORDER — FENTANYL CITRATE (PF) 100 MCG/2ML IJ SOLN
INTRAMUSCULAR | Status: AC
  Filled 2019-03-10: qty 2

## 2019-03-10 MED ORDER — LIDOCAINE HCL (PF) 1 % IJ SOLN
INTRAMUSCULAR | Status: DC | PRN
  Administered 2019-03-10 (×2): 2 mL via INTRADERMAL

## 2019-03-10 MED ORDER — SODIUM CHLORIDE 0.9% FLUSH: 3.0000 mL | INTRAVENOUS | Status: DC | PRN

## 2019-03-10 MED ORDER — FENTANYL CITRATE (PF) 100 MCG/2ML IJ SOLN
INTRAMUSCULAR | Status: DC | PRN
  Administered 2019-03-10: 12.5 ug via INTRAVENOUS

## 2019-03-10 MED ORDER — HEPARIN (PORCINE) IN NACL 1000-0.9 UT/500ML-% IV SOLN
INTRAVENOUS | Status: AC
  Filled 2019-03-10: qty 1000

## 2019-03-10 MED ORDER — SODIUM CHLORIDE 0.9 % IV SOLN: 250.0000 mL | INTRAVENOUS | Status: DC | PRN

## 2019-03-10 MED ORDER — SODIUM CHLORIDE 0.9% FLUSH
3.0000 mL | Freq: Two times a day (BID) | INTRAVENOUS | Status: DC
  Administered 2019-03-11 – 2019-03-16 (×9): 3 mL via INTRAVENOUS

## 2019-03-10 MED ORDER — MIDAZOLAM HCL 2 MG/2ML IJ SOLN
INTRAMUSCULAR | Status: AC
  Filled 2019-03-10: qty 2

## 2019-03-10 MED ORDER — LABETALOL HCL 5 MG/ML IV SOLN: 10.0000 mg | INTRAVENOUS | Status: AC | PRN

## 2019-03-10 MED ORDER — HYDRALAZINE HCL 20 MG/ML IJ SOLN: 10.0000 mg | INTRAMUSCULAR | Status: AC | PRN

## 2019-03-10 MED ORDER — HEPARIN SODIUM (PORCINE) 1000 UNIT/ML IJ SOLN
INTRAMUSCULAR | Status: DC | PRN
  Administered 2019-03-10: 3000 [IU] via INTRAVENOUS

## 2019-03-10 MED ORDER — VERAPAMIL HCL 2.5 MG/ML IV SOLN
INTRAVENOUS | Status: AC
  Filled 2019-03-10: qty 2

## 2019-03-10 MED ORDER — IOHEXOL 350 MG/ML SOLN
INTRAVENOUS | Status: DC | PRN
  Administered 2019-03-10: 11:00:00 85 mL via INTRA_ARTERIAL

## 2019-03-10 MED ORDER — LIDOCAINE HCL (PF) 1 % IJ SOLN
INTRAMUSCULAR | Status: AC
  Filled 2019-03-10: qty 30

## 2019-03-10 SURGICAL SUPPLY — 19 items
BAG SNAP BAND KOVER 36X36 (MISCELLANEOUS) ×1 IMPLANT
CATH 5FR JL3.5 JR4 ANG PIG MP (CATHETERS) ×1 IMPLANT
CATH BALLN WEDGE 5F 110CM (CATHETERS) ×1 IMPLANT
CATH INFINITI 5F JL4 125CM (CATHETERS) ×1 IMPLANT
CATH INFINITI 5F PIG 125CM (CATHETERS) ×1 IMPLANT
CATH INFINITI 5FR AL1 (CATHETERS) ×1 IMPLANT
COVER DOME SNAP 22 D (MISCELLANEOUS) ×1 IMPLANT
DEVICE RAD COMP TR BAND LRG (VASCULAR PRODUCTS) ×1 IMPLANT
GLIDESHEATH SLEND SS 6F .021 (SHEATH) ×1 IMPLANT
GUIDEWIRE INQWIRE 1.5J.035X260 (WIRE) IMPLANT
INQWIRE 1.5J .035X260CM (WIRE) ×2
KIT HEART LEFT (KITS) ×2 IMPLANT
PACK CARDIAC CATHETERIZATION (CUSTOM PROCEDURE TRAY) ×2 IMPLANT
SHEATH GLIDE SLENDER 4/5FR (SHEATH) ×1 IMPLANT
TRANSDUCER W/STOPCOCK (MISCELLANEOUS) ×2 IMPLANT
TUBING CIL FLEX 10 FLL-RA (TUBING) ×2 IMPLANT
WIRE EMERALD 3MM-J .025X260CM (WIRE) ×1 IMPLANT
WIRE EMERALD ST .035X150CM (WIRE) ×1 IMPLANT
WIRE EMERALD ST .035X260CM (WIRE) ×1 IMPLANT

## 2019-03-10 NOTE — Progress Notes (Signed)
Carotid artery duplex has been completed. Preliminary results can be found in CV Proc through chart review.   03/10/19 4:03 PM Dan Perez RVT

## 2019-03-10 NOTE — Interval H&P Note (Signed)
History and Physical Interval Note:  03/10/2019 10:05 AM  [name]  has presented today for cardiac cath with the diagnosis of severe aortic stenosis.  The various methods of treatment have been discussed with the patient and family. After consideration of risks, benefits and other options for treatment, the patient has consented to  Procedure(s): RIGHT/LEFT HEART CATH AND CORONARY ANGIOGRAPHY (N/A) as a surgical intervention.  The patient's history has been reviewed, patient examined, no change in status, stable for surgery.  I have reviewed the patient's chart and labs.  Questions were answered to the patient's satisfaction.    Cath Lab Visit (complete for each Cath Lab visit)  Clinical Evaluation Leading to the Procedure:   ACS: No.  Non-ACS:    Anginal Classification: CCS II  Anti-ischemic medical therapy: No Therapy  Non-Invasive Test Results: No non-invasive testing performed  Prior CABG: No previous CABG         Lauree Chandler

## 2019-03-10 NOTE — Progress Notes (Signed)
  [place] VALVE TEAM  Patient has mild non obst CAD. Plan to continued TAVR work up. Scans scheduled for tomorrow if renal function stable. Carotid dopplers, PT consult and CT surgery consult also pending.   Angelena Form PA-C  [facility]

## 2019-03-10 NOTE — Telephone Encounter (Signed)
TSH done on 03/07/19 as he is inpatient at [facility] currently.

## 2019-03-10 NOTE — Plan of Care (Signed)

## 2019-03-10 NOTE — Progress Notes (Signed)
   Received page from [place] at 12:26pm with concerns of BP. Systolic BP in the 741'O at the time with heart rate in the 50's. Recommended continuing to monitor at the time and would confirm any PRN antihypertensives with Dr. Irish Lack given severe aortic stenosis and syncope on presentation. BP has since improved with systolic BP now in the 878'M. Discussed with Dr. Irish Lack who is okay with systolic BP being in the 767'M and 150's. If higher, can give PRN Hydralazine as listed in post-cath orders.  Darreld Mclean, PA-C 03/10/2019 4:25 PM

## 2019-03-10 NOTE — H&P (View-Only) (Signed)
Progress Note  Patient Name: Dan Perez Date of Encounter: 03/10/2019  Primary Cardiologist: Kate Sable, MD   Subjective   Feels well.    Inpatient Medications    Scheduled Meds: . amLODipine  5 mg Oral Daily  . memantine  28 mg Oral QHS   And  . donepezil  10 mg Oral QHS  . enoxaparin (LOVENOX) injection  40 mg Subcutaneous Q24H  . feeding supplement (ENSURE ENLIVE)  237 mL Oral BID BM  . multivitamin with minerals  1 tablet Oral Daily  . sertraline  50 mg Oral Daily  . sodium chloride flush  3 mL Intravenous Q12H  . sodium chloride flush  3 mL Intravenous Q12H   Continuous Infusions: . sodium chloride    . sodium chloride    . sodium chloride 1 mL/kg/hr (03/10/19 0524)   PRN Meds: sodium chloride, sodium chloride, acetaminophen, ondansetron (ZOFRAN) IV, sodium chloride flush, sodium chloride flush   Vital Signs    Vitals:   03/09/19 1736 03/09/19 1931 03/10/19 0530 03/10/19 0535  BP: (!) 152/76 (!) 142/65  (!) 173/84  Pulse: 66 68  (!) 58  Resp: 16 18  18   Temp: 98.5 F (36.9 C) 98.3 F (36.8 C)  (!) 97.5 F (36.4 C)  TempSrc: Oral Oral  Oral  SpO2: 95% 98%  97%  Weight:   63.2 kg   Height:        Intake/Output Summary (Last 24 hours) at 03/10/2019 0840 Last data filed at 03/10/2019 0600 Gross per 24 hour  Intake 1444.43 ml  Output 1750 ml  Net -305.57 ml   Last 3 Weights 03/10/2019 03/09/2019 03/08/2019  Weight (lbs) 139 lb 4.8 oz 139 lb 12.8 oz 139 lb 4.8 oz  Weight (kg) 63.186 kg 63.413 kg 63.186 kg      Telemetry    NSR - Personally Reviewed  ECG    None recent  Physical Exam   GEN: No acute distress.   Neck: No JVD Cardiac: RRR, no murmurs, rubs, or gallops.  Respiratory: Clear to auscultation bilaterally. GI: Soft, nontender, non-distended  MS: No edema; Bruised left face Neuro:  Nonfocal  Psych: Normal affect   Labs    High Sensitivity Troponin:  No results for input(s): TROPONINIHS in the last 720 hours.     Cardiac Enzymes Recent Labs  Lab 03/06/19 2042  TROPONINI 0.03*   No results for input(s): TROPIPOC in the last 168 hours.   Chemistry Recent Labs  Lab 03/08/19 0820 03/09/19 0608 03/10/19 0433  NA 135 133* 133*  K 4.5 5.2* 5.0  CL 101 98 97*  CO2 25 29 29   GLUCOSE 90 94 91  BUN 14 20 22   CREATININE 1.19 1.32* 1.27*  CALCIUM 8.7* 8.7* 8.6*  GFRNONAA 56* 49* 52*  GFRAA >60 57* 60*  ANIONGAP 9 6 7      Hematology Recent Labs  Lab 03/07/19 0427  WBC 6.5  RBC 3.75*  HGB 10.7*  HCT 34.1*  MCV 90.9  MCH 28.5  MCHC 31.4  RDW 13.6  PLT 178    BNPNo results for input(s): BNP, PROBNP in the last 168 hours.   DDimer No results for input(s): DDIMER in the last 168 hours.   Radiology    No results found.  Cardiac Studies   Severe AS  Patient Profile     85 y.o. male with syncope related to AS  Assessment & Plan    1) AS: Syncope related to AS.  Plan  for cath today in preparation of TAVR.  All questions about cath reviewed  For questions or updates, please contact [facility] Please consult [email or web address] for contact info under        Signed, Hagen Bohorquez, MD  03/10/2019, 8:40 AM    

## 2019-03-10 NOTE — Progress Notes (Signed)
Report given to [facility] in cath lab, pt has been NPO except sip with BP med, pt alert to self, CCMD called to inform to cath lab, stable to transport

## 2019-03-10 NOTE — Progress Notes (Signed)
Pt received and updated from [facility] that TR band has 9cc air currently, fingers cool but + cap refill, pt attached to dinamap and frequency setup, CCMD called to confrim pt on telemetry post cath, pt instructed on arm restrcitions, lunch tray orderd and AD Londra called dtr to update per pt request

## 2019-03-10 NOTE — Progress Notes (Signed)
Pt sbp remains 170's, hr 56 range, only prn bp med is labetelol and not meet parameters, paged Sande Rives PA for cards, pt denies cp or sob, TR band level 0

## 2019-03-10 NOTE — Progress Notes (Signed)
Progress Note  Patient Name: PAYDEN DOCTER Date of Encounter: 03/10/2019  Primary Cardiologist: Kate Sable, MD   Subjective   Feels well.    Inpatient Medications    Scheduled Meds: . amLODipine  5 mg Oral Daily  . memantine  28 mg Oral QHS   And  . donepezil  10 mg Oral QHS  . enoxaparin (LOVENOX) injection  40 mg Subcutaneous Q24H  . feeding supplement (ENSURE ENLIVE)  237 mL Oral BID BM  . multivitamin with minerals  1 tablet Oral Daily  . sertraline  50 mg Oral Daily  . sodium chloride flush  3 mL Intravenous Q12H  . sodium chloride flush  3 mL Intravenous Q12H   Continuous Infusions: . sodium chloride    . sodium chloride    . sodium chloride 1 mL/kg/hr (03/10/19 0524)   PRN Meds: sodium chloride, sodium chloride, acetaminophen, ondansetron (ZOFRAN) IV, sodium chloride flush, sodium chloride flush   Vital Signs    Vitals:   03/09/19 1736 03/09/19 1931 03/10/19 0530 03/10/19 0535  BP: (!) 152/76 (!) 142/65  (!) 173/84  Pulse: 66 68  (!) 58  Resp: 16 18  18   Temp: 98.5 F (36.9 C) 98.3 F (36.8 C)  (!) 97.5 F (36.4 C)  TempSrc: Oral Oral  Oral  SpO2: 95% 98%  97%  Weight:   63.2 kg   Height:        Intake/Output Summary (Last 24 hours) at 03/10/2019 0840 Last data filed at 03/10/2019 0600 Gross per 24 hour  Intake 1444.43 ml  Output 1750 ml  Net -305.57 ml   Last 3 Weights 03/10/2019 03/09/2019 03/08/2019  Weight (lbs) 139 lb 4.8 oz 139 lb 12.8 oz 139 lb 4.8 oz  Weight (kg) 63.186 kg 63.413 kg 63.186 kg      Telemetry    NSR - Personally Reviewed  ECG    None recent  Physical Exam   GEN: No acute distress.   Neck: No JVD Cardiac: RRR, no murmurs, rubs, or gallops.  Respiratory: Clear to auscultation bilaterally. GI: Soft, nontender, non-distended  MS: No edema; Bruised left face Neuro:  Nonfocal  Psych: Normal affect   Labs    High Sensitivity Troponin:  No results for input(s): TROPONINIHS in the last 720 hours.     Cardiac Enzymes Recent Labs  Lab 03/06/19 2042  TROPONINI 0.03*   No results for input(s): TROPIPOC in the last 168 hours.   Chemistry Recent Labs  Lab 03/08/19 0820 03/09/19 0608 03/10/19 0433  NA 135 133* 133*  K 4.5 5.2* 5.0  CL 101 98 97*  CO2 25 29 29   GLUCOSE 90 94 91  BUN 14 20 22   CREATININE 1.19 1.32* 1.27*  CALCIUM 8.7* 8.7* 8.6*  GFRNONAA 56* 49* 52*  GFRAA >60 57* 60*  ANIONGAP 9 6 7      Hematology Recent Labs  Lab 03/07/19 0427  WBC 6.5  RBC 3.75*  HGB 10.7*  HCT 34.1*  MCV 90.9  MCH 28.5  MCHC 31.4  RDW 13.6  PLT 178    BNPNo results for input(s): BNP, PROBNP in the last 168 hours.   DDimer No results for input(s): DDIMER in the last 168 hours.   Radiology    No results found.  Cardiac Studies   Severe AS  Patient Profile     85 y.o. male with syncope related to AS  Assessment & Plan    1) AS: Syncope related to AS.  Plan  for cath today in preparation of TAVR.  All questions about cath reviewed  For questions or updates, please contact [facility] Please consult [email or web address] for contact info under        Signed, Lance MussJayadeep Infinity Jeffords, MD  03/10/2019, 8:40 AM

## 2019-03-11 ENCOUNTER — Inpatient Hospital Stay (HOSPITAL_COMMUNITY): Payer: PPO

## 2019-03-11 DIAGNOSIS — R918 Other nonspecific abnormal finding of lung field: Secondary | ICD-10-CM

## 2019-03-11 DIAGNOSIS — I350 Nonrheumatic aortic (valve) stenosis: Secondary | ICD-10-CM

## 2019-03-11 HISTORY — DX: Other nonspecific abnormal finding of lung field: R91.8

## 2019-03-11 LAB — CBC
HCT: 33.9 % — ABNORMAL LOW (ref 39.0–52.0)
Hemoglobin: 11 g/dL — ABNORMAL LOW (ref 13.0–17.0)
MCH: 29.3 pg (ref 26.0–34.0)
MCHC: 32.4 g/dL (ref 30.0–36.0)
MCV: 90.4 fL (ref 80.0–100.0)
Platelets: 174 10*3/uL (ref 150–400)
RBC: 3.75 MIL/uL — ABNORMAL LOW (ref 4.22–5.81)
RDW: 13.6 % (ref 11.5–15.5)
WBC: 6.7 10*3/uL (ref 4.0–10.5)
nRBC: 0 % (ref 0.0–0.2)

## 2019-03-11 LAB — BASIC METABOLIC PANEL
Anion gap: 7 (ref 5–15)
BUN: 22 mg/dL (ref 8–23)
CO2: 29 mmol/L (ref 22–32)
Calcium: 8.8 mg/dL — ABNORMAL LOW (ref 8.9–10.3)
Chloride: 100 mmol/L (ref 98–111)
Creatinine, Ser: 1.16 mg/dL (ref 0.61–1.24)
GFR calc Af Amer: >60 mL/min (ref >60)
GFR calc non Af Amer: 58 mL/min — ABNORMAL LOW (ref >60)
Glucose, Bld: 91 mg/dL (ref 70–99)
Potassium: 5.1 mmol/L (ref 3.5–5.1)
Sodium: 136 mmol/L (ref 135–145)

## 2019-03-11 MED ORDER — IOHEXOL 350 MG/ML SOLN
100.0000 mL | Freq: Once | INTRAVENOUS | Status: AC | PRN
  Administered 2019-03-11: 100 mL via INTRAVENOUS

## 2019-03-11 NOTE — Progress Notes (Signed)
Pt to CT in bed, stable condition, informed CT staff via telephone pt is alert and oriented to self but pleasant and cooperative

## 2019-03-11 NOTE — Progress Notes (Signed)
Nutrition Follow-up  DOCUMENTATION CODES:   Severe malnutrition in context of chronic illness  INTERVENTION:   -Continue Ensure Enlive po BID, each supplement provides 350 kcal and 20 grams of protein -Continue MVI with minerals daily  NUTRITION DIAGNOSIS:   Severe Malnutrition related to chronic illness(severe aortic stenosis, CKD) as evidenced by severe fat depletion, severe muscle depletion, energy intake < 75% for > or equal to 1 month.  Ongoing  GOAL:   Patient will meet greater than or equal to 90% of their needs  Progressing   MONITOR:   PO intake, Supplement acceptance, Labs, Weight trends, Skin, I & O's  REASON FOR ASSESSMENT:   Malnutrition Screening Tool    ASSESSMENT:   85 y/o male with a h/o HTN, progressive/Severe aortic stenosis, stage III chronic kidney disease, moderate mitral regurgitation, dilated abdominal aorta, and coronary calcium noted on prior CT.  Aortic stenosis has been followed over the years with serial echocardiography.  Most recent echo in May of this year, showed progression to severe aortic stenosis with a relatively low mean gradient of 24 mmHg but with an aortic valve area of 0.75 cm.  He was recently evaluated by Dr. Bronson Ing on 6/9, b/c his dtr reported that he was having increasing dyspnea along with sudden spells of weakness during which, he would become very pale.  These spells were short lived.  Patient says that he has no recollection of these spells occurring, simply that his daughter tells him that they happen.  In the setting of concern for possible syncope and known severe AS, he was referred for TAVR eval and currently has an appt w/ Dr. Angelena Form on 7/6.  Of note, he has been markedly hypertensive at home and on office visits, and this has resulted in titration of his ACE inhibitor therapy.  He also takes Lasix.  6/23- s/p rt/lt heart cath with coronary angiography  Reviewed I/O's: +1 L x 24 hours and _2.3 L since  admission  UOP: 800 ml x 24 hours  Per MD notes, suspect syncope related to severe aortic stenosis.   Spoke with RN, who reports pt is eating well and drinking Ensure supplements. Noted meal completion 100%.   Per MD notes TAVR work-up continues.   Labs reviewed.   NUTRITION - FOCUSED PHYSICAL EXAM:    Most Recent Value  Orbital Region  Severe depletion  Upper Arm Region  Severe depletion  Thoracic and Lumbar Region  Severe depletion  Buccal Region  Severe depletion  Temple Region  Severe depletion  Clavicle Bone Region  Severe depletion  Clavicle and Acromion Bone Region  Severe depletion  Scapular Bone Region  Severe depletion  Dorsal Hand  Severe depletion  Patellar Region  Severe depletion  Anterior Thigh Region  Severe depletion  Posterior Calf Region  Severe depletion  Edema (RD Assessment)  None  Hair  Reviewed  Eyes  Reviewed  Mouth  Reviewed  Skin  Reviewed  Nails  Reviewed       Diet Order:   Diet Order            Diet Heart Room service appropriate? Yes; Fluid consistency: Thin  Diet effective now              EDUCATION NEEDS:   Education needs have been addressed  Skin:  Skin Assessment: Skin Integrity Issues: Skin Integrity Issues:: Other (Comment), Incisions Incisions: lt head Other: MASD bilateral buttocks  Last BM:  03/09/19  Height:   Ht Readings from Last  1 Encounters:  03/06/19 6' (1.829 m)    Weight:   Wt Readings from Last 1 Encounters:  03/11/19 63.2 kg    Ideal Body Weight:  80.9 kg  BMI:  Body mass index is 18.89 kg/m.  Estimated Nutritional Needs:   Kcal:  1700-1900  Protein:  80-95 grams  Fluid:  > 1.7 L    Ozzie Remmers A. Mayford KnifeWilliams, RD, LDN, CDCES Registered Dietitian II Certified Diabetes Care and Education Specialist Pager: [phone] After hours Pager: [phone]

## 2019-03-11 NOTE — Progress Notes (Signed)
Progress Note  Patient Name: Dan Perez Date of Encounter: 03/11/2019  Primary Cardiologist: Prentice Docker, MD   Subjective   Feels ok.  Had CT scan.  Inpatient Medications    Scheduled Meds:  amLODipine  5 mg Oral Daily   memantine  28 mg Oral QHS   And   donepezil  10 mg Oral QHS   enoxaparin (LOVENOX) injection  40 mg Subcutaneous Q24H   feeding supplement (ENSURE ENLIVE)  237 mL Oral BID BM   multivitamin with minerals  1 tablet Oral Daily   sertraline  50 mg Oral Daily   sodium chloride flush  3 mL Intravenous Q12H   sodium chloride flush  3 mL Intravenous Q12H   sodium chloride flush  3 mL Intravenous Q12H   Continuous Infusions:  sodium chloride     sodium chloride     PRN Meds: sodium chloride, sodium chloride, acetaminophen, ondansetron (ZOFRAN) IV, sodium chloride flush, sodium chloride flush   Vital Signs    Vitals:   03/10/19 1407 03/10/19 2054 03/11/19 0354 03/11/19 0703  BP: (!) 141/74 (!) 150/81 (!) 154/79   Pulse: 66 72 61   Resp: Temp:  97.7 F (36.5 C) 97.6 F (36.4 C)   TempSrc:  Oral Oral   SpO2: 95% 96% 96%   Weight:    63.2 kg  Height:        Intake/Output Summary (Last 24 hours) at 03/11/2019 1024 Last data filed at 03/11/2019 1007 Gross per 24 hour  Intake 2098.95 ml  Output 1050 ml  Net 1048.95 ml   Filed Weights   03/09/19 0525 03/10/19 0530 03/11/19 0703  Weight: 63.4 kg 63.2 kg 63.2 kg    Telemetry    Sinus rhythm with rates mostly in the high 50's to 70's but occasional brief episodes in the 90's. - Personally Reviewed  ECG    No new ECG tracing today - Personally Reviewed  Physical Exam   GEN: Elderly Caucasian male resting comfortably. Alert and in no acute distress.   Neck: Supple. No JVD. Cardiac: RRR. 3/6 systolic murmurs, gallops, or rubs.  Respiratory: Clear to auscultation bilaterally. No wheezes, rhonchi, or rales. GI: Abdomen soft, non-distended, and non-tender. Bowel  sounds present. Extremities: No lower extremity edema. No deformity. Radial and distal pedal pulses 2+ and equal bilaterally. 2+ right radial pulse; small area of bruising; bruised left side of face Skin: Warm and dry. Neuro:  No focal deficits. Psych: Normal affect. Responds appropriately.  Labs    Chemistry Recent Labs  Lab 03/09/19 [phone] 03/10/19 0433 03/10/19 1045 03/10/19 1051 03/11/19 0639  NA 133* 133* 136 139 136  K 5.2* 5.0 4.4 4.1 5.1  CL 98 97*  --   --  100  CO2 29 29  --   --  29  GLUCOSE 94 91  --   --  91  BUN 20 22  --   --  22  CREATININE 1.32* 1.27*  --   --  1.16  CALCIUM 8.7* 8.6*  --   --  8.8*  GFRNONAA 49* 52*  --   --  58*  GFRAA 57* 60*  --   --  >60  ANIONGAP 6 7  --   --  7     Hematology Recent Labs  Lab 03/07/19 0427 03/10/19 1045 03/10/19 1051 03/11/19 0639  WBC 6.5  --   --  6.7  RBC 3.75*  --   --  3.75*  HGB 10.7* 11.9* 10.9* 11.0*  HCT 34.1* 35.0* 32.0* 33.9*  MCV 90.9  --   --  90.4  MCH 28.5  --   --  29.3  MCHC 31.4  --   --  32.4  RDW 13.6  --   --  13.6  PLT 178  --   --  174    Cardiac Enzymes Recent Labs  Lab 03/06/19 2042  TROPONINI 0.03*   No results for input(s): TROPIPOC in the last 168 hours.   BNPNo results for input(s): BNP, PROBNP in the last 168 hours.   DDimer No results for input(s): DDIMER in the last 168 hours.   Radiology    [place] Carotid  Result Date: 03/11/2019 Carotid Arterial Duplex Study Indications:       Pre-TAVR. Other Factors:     Aortic stenosis. Limitations:       Patient body habitus, patient anatomy, patient movement Comparison Study:  No prior studies. Performing Technologist: Chanda BusingGregory Collins RVT  Examination Guidelines: A complete evaluation includes B-mode imaging, spectral Doppler, color Doppler, and power Doppler as needed of all accessible portions of each vessel. Bilateral testing is considered an integral part of a complete examination. Limited examinations for reoccurring  indications may be performed as noted.  Right Carotid Findings: +----------+--------+--------+--------+-----------------------+--------+             PSV cm/s EDV cm/s Stenosis Describe                Comments  +----------+--------+--------+--------+-----------------------+--------+  CCA Prox   72       10                                        tortuous  +----------+--------+--------+--------+-----------------------+--------+  CCA Distal 71       12                smooth and heterogenous           +----------+--------+--------+--------+-----------------------+--------+  ICA Prox   60       15                smooth and heterogenous           +----------+--------+--------+--------+-----------------------+--------+  ICA Distal 77       17                                                  +----------+--------+--------+--------+-----------------------+--------+  ECA        56       0                                                   +----------+--------+--------+--------+-----------------------+--------+ +----------+--------+-------+--------+-------------------+             PSV cm/s EDV cms Describe Arm Pressure (mmHG)  +----------+--------+-------+--------+-------------------+  Subclavian 88                                             +----------+--------+-------+--------+-------------------+ +---------+--------+--+--------+-+---------+  Vertebral PSV cm/s 30 EDV cm/s 6 Antegrade  +---------+--------+--+--------+-+---------+  Left Carotid Findings: +----------+--------+-------+--------+--------------------------------+--------+             PSV cm/s EDV     Stenosis Describe                         Comments                       cm/s                                                        +----------+--------+-------+--------+--------------------------------+--------+  CCA Prox   67       12               smooth and heterogenous          tortuous   +----------+--------+-------+--------+--------------------------------+--------+  CCA Distal 77       19               smooth and heterogenous                    +----------+--------+-------+--------+--------------------------------+--------+  ICA Prox   64       16               smooth, heterogenous and                                                         calcific                                   +----------+--------+-------+--------+--------------------------------+--------+  ICA Distal 69       20                                                          +----------+--------+-------+--------+--------------------------------+--------+  ECA        85       9                                                           +----------+--------+-------+--------+--------------------------------+--------+ +----------+--------+--------+--------+-------------------+  Subclavian PSV cm/s EDV cm/s Describe Arm Pressure (mmHG)  +----------+--------+--------+--------+-------------------+             91                                              +----------+--------+--------+--------+-------------------+ +---------+--------+--+--------+-+---------+  Vertebral PSV cm/s 39 EDV cm/s 8 Antegrade  +---------+--------+--+--------+-+---------+  Summary: Right Carotid: Velocities in the right ICA are consistent with a 1-39% stenosis. Left Carotid: Velocities  in the left ICA are consistent with a 1-39% stenosis. Vertebrals: Bilateral vertebral arteries demonstrate antegrade flow. *See table(s) above for measurements and observations.  Electronically signed by Waverly Ferrarihristopher Dickson MD on 03/11/2019 at 6:29:41 AM.    Final     Cardiac Studies   Echocardiogram 01/28/2019: 1. The left ventricle has normal systolic function, with an ejection fraction of 55-60%. The cavity size was normal. There is mildly increased left ventricular wall thickness. Left ventricular diastolic Doppler parameters are consistent with impaired  relaxation. No  evidence of left ventricular regional wall motion abnormalities.  2. The right ventricle has normal systolic function. The cavity was normal. There is no increase in right ventricular wall thickness.  3. Small pericardial effusion.  4. The pericardial effusion is anterior to the right ventricle. Also possible left pleural effusion.  5. The aortic valve has an indeterminate number of cusps. Severe calcifcation of the aortic valve. Suspect evere stenosis of the aortic valve. Mean gradient is only 24 mmHg, but the calculated valve area and dimentionless index both are consistent with  severe stenosis. Moderate aortic annular calcification noted.  6. The mitral valve is degenerative. Mild thickening of the mitral valve leaflet. There is mild mitral annular calcification present. Mitral valve regurgitation is moderate by color flow Doppler.  7. The tricuspid valve is grossly normal.  8. The aortic root is normal in size and structure. _______________  Right/Left Heart Catheterization 03/10/2019:  Ost RCA to Prox RCA lesion is 30% stenosed.  Prox Cx to Mid Cx lesion is 20% stenosed.  Mid LAD lesion is 20% stenosed.   1. Mild non-obstructive CAD 2. Unable to cross into the LV with a diagnostic catheter. The AL-1 was too short given his height and arm length). The long pigtail would not cross the valve given the severe angulation of the horizontal aortic root.   Recommendations: Will continue inpatient workup for TAVR. Will plans CT scans tomorrow if renal function is stable. Dr. Cornelius Moraswen will also see over the next day.  _______________  Carotid Ultrasounds 03/10/2019: Summary: Right Carotid: Velocities in the right ICA are consistent with a 1-39% stenosis. Left Carotid: Velocities in the left ICA are consistent with a 1-39% stenosis. Vertebrals: Bilateral vertebral arteries demonstrate antegrade flow.   Patient Profile   [name] is a 85 y.o. male with a history of progressive and now  severe aortic stenosis moderate mitral regurgitation, coronary calcium, abdominal aortic aneurysm, hypertension, CKD stage III, chronic delusional disorder with paranoia, who [facility] for further evaluation of syncope given history.  Assessment & Plan    Syncope - Patient presented with several week history of intermittent spell described as weakness and becoming pale. Patient does not have much recollection of these spells occurring and is only aware that they might happen because his daughter has reported this to him. He has had 2 syncopal spells in the 2 days prior to presentation, each occurring while walking followed by sudden loss of consciousness.  He suffered left-sided facial trauma after today's fall. [facility] was relatively unremarkable with the exception of left-sided facial/orbital hematoma with laceration requiring suturing.  - Telemetry shows sinus rhythm with rates mostly in the high 50's to 70's. No abnormal arrhythmias noted.  - BP actually elevated most of the time. - Currently undergoing work-up in preparation for TAVR.  Severe Aortic Stenosis - Echo from 01/2019 showed LVEF of 55-60% with grade 1 diastolic dysfunction with no regional  wall motion abnormalities. Severe calcification of the aortiv valve was noted and sever aortric stenosis was suspected. Mean gradient is only 24 mmHg, but the calculated valve area and dimentionless index both are consistent with severe stenosis. - Right/left heart catheterization yesterday showed mild non-obstructive disease. Dr. McAlhany was unable to cross into the LV with a diagnostic catheter.  - Plan is to continue inpatient worClifton Jamesk-up for TAVR with CT scans today. Dr. Cornelius Moraswen is also planning on seeing the patient while here. - Structural team following along.  Hypertension - Systolic BP ranging anywhere form the 120's to 180's over the last 24 hours but mostly >140. Most  recent BP 154/79. - Home Lisinopril and Lasix held on admission due to severe aortic stenosis and syncope. - Currently only on Amlodipine 5mg  daily.  Elevated Troponin - [facility]. Repeat here borderline positive at 0.03. - No history of chest pain. - Left heart catheterization showed mild non-obstructive disease.  CKD Stage III - Serum creatinine 1.16 today. Baseline creatinine in the 1.1 to 1.4 range. - Continue to monitor.  Chronic Delusional Disorder with Paranoia - Patient has chronic delusion disorder with paranoia listed in chart. - Continue home Zoloft and Memantine/Donepezil. - Patient's daughter [name] is his legal guardian.   For questions or updates, please contact [facility] Please consult [email or web address] for contact info under Cardiology/STEMI.      Signed, Corrin Parkerallie E Goodrich, PA-C  03/11/2019, 10:24 AM   Pager: [phone]  I have examined the patient and reviewed assessment and plan and discussed with patient.  Agree with above as stated.  TAVR team following.  Plan for TAVR on Friday.  Patient is agreeable to stay in house.  Right radial site without hematoma.  Cr. Stable.   Lance MussJayadeep Kidada Ging

## 2019-03-11 NOTE — Evaluation (Signed)
Physical Therapy Evaluation Patient Details Name: Dan Perez MRN: [id number] DOB: December 03, 1931 Today's Date: 03/11/2019   History of Present Illness  85 y/o m with a h/o progressive and now severe aortic stenosis, moderate MR, HTN, CKD III, and coronary calcium, who presents on tx from [facility] for admission following syncope.Admitted for evaluation of AS. s/p cardiac cath 6/23. Currently being worked up for TAVR  Clinical Impression  PTA pt living with daughters in multistory home with bed and bath on first floor and 4 steps to enter. Pt reports being able to ambulate in his home without AD and assist with iADLs. Pt is currently limited in safe mobility by decreased knowledge of DME, as well as decreased strength, balance and endurance. Pt requires min guard for bed mobility and min A for transfers and ambulation of 15 feet with RW. PT will need 24 hour care but will likely do best at home if daughters and family can provide 24 hour care. Pt scheduled for TAVR tomorrow. PT will follow back after surgery.    Follow Up Recommendations Supervision/Assistance - 24 hour;Home health PT    Equipment Recommendations  Rolling walker with 5" wheels    Recommendations for Other Services OT consult     Precautions / Restrictions Precautions Precautions: Fall Precaution Comments: hx of syncope with fall Restrictions Weight Bearing Restrictions: No      Mobility  Bed Mobility Overal bed mobility: Needs Assistance Bed Mobility: Supine to Sit     Supine to sit: Min guard     General bed mobility comments: min guard for safety  Transfers Overall transfer level: Needs assistance Equipment used: Rolling walker (2 wheeled);None Transfers: Sit to/from Stand Sit to Stand: Mod assist;Min assist         General transfer comment: modA for power up and steadying without AD, minA for powerup and steadying with RW  Ambulation/Gait Ambulation/Gait assistance: Min assist Gait Distance  (Feet): 15 Feet Assistive device: Rolling walker (2 wheeled) Gait Pattern/deviations: Step-through pattern;Decreased step length - right;Decreased step length - left;Shuffle;Narrow base of support;Trunk flexed Gait velocity: slowed Gait velocity interpretation: <1.8 ft/sec, indicate of risk for recurrent falls General Gait Details: minA for steadying and RW navigation, vc for proximity to RW, upright posture and navigation around obstacles     Balance Overall balance assessment: Needs assistance Sitting-balance support: Feet supported;No upper extremity supported Sitting balance-Leahy Scale: Fair     Standing balance support: Bilateral upper extremity supported Standing balance-Leahy Scale: Poor Standing balance comment: requires UE support to maintain balance                             Pertinent Vitals/Pain Pain Assessment: Faces Faces Pain Scale: Hurts a little bit Pain Location: generalized with standing Pain Descriptors / Indicators: Grimacing;Guarding Pain Intervention(s): Limited activity within patient's tolerance;Monitored during session;Repositioned;Patient requesting pain meds-RN notified    Home Living Family/patient expects to be discharged to:: Private residence Living Arrangements: Children Available Help at Discharge: Family;Available 24 hours/day Type of Home: House Home Access: Stairs to enter   [employer] of Steps: 4 Home Layout: Two level Home Equipment: (not sure)      Prior Function Level of Independence: Needs assistance   Gait / Transfers Assistance Needed: ambulates in home without AD  ADL's / Homemaking Assistance Needed: requires assist from daughters for bathing and dressing            Extremity/Trunk Assessment   Upper Extremity Assessment  Upper Extremity Assessment: Generalized weakness    Lower Extremity Assessment Lower Extremity Assessment: Generalized weakness;RLE deficits/detail;LLE deficits/detail RLE  Deficits / Details: ROM WFL, knees lacking full extension, strength grossly 4/5 RLE Sensation: WNL RLE Coordination: WNL LLE Deficits / Details: ROM WFL, knees lacking full extension, strength grossly 4/5 LLE Sensation: WNL LLE Coordination: WNL    Cervical / Trunk Assessment Cervical / Trunk Assessment: Kyphotic  Communication   Communication: HOH  Cognition Arousal/Alertness: Awake/alert Behavior During Therapy: WFL for tasks assessed/performed Overall Cognitive Status: Impaired/Different from baseline Area of Impairment: Orientation;Memory;Safety/judgement;Problem solving;Awareness                 Orientation Level: Time;Disoriented to;Situation   Memory: Decreased short-term memory   Safety/Judgement: Decreased awareness of deficits;Decreased awareness of safety Awareness: Emergent Problem Solving: Slow processing;Requires verbal cues;Requires tactile cues        General Comments General comments (skin integrity, edema, etc.): Pt with contusions to face from fall prior to admission, VSS        Assessment/Plan    PT Assessment Patient needs continued PT services  PT Problem List Decreased strength;Decreased activity tolerance;Decreased balance;Decreased mobility;Decreased cognition;Decreased safety awareness;Decreased knowledge of use of DME       PT Treatment Interventions DME instruction;Gait training;Functional mobility training;Therapeutic activities;Therapeutic exercise;Balance training;Cognitive remediation;Patient/family education;Stair training    PT Goals (Current goals can be found in the Care Plan section)  Acute Rehab PT Goals Patient Stated Goal: go home with daughters PT Goal Formulation: With patient Time For Goal Achievement: 03/25/19 Potential to Achieve Goals: Fair    Frequency Min 3X/week    AM-PAC PT "6 Clicks" Mobility  Outcome Measure Help needed turning from your back to your side while in a flat bed without using bedrails?:  None Help needed moving from lying on your back to sitting on the side of a flat bed without using bedrails?: None Help needed moving to and from a bed to a chair (including a wheelchair)?: A Little Help needed standing up from a chair using your arms (e.g., wheelchair or bedside chair)?: A Little Help needed to walk in hospital room?: A Little Help needed climbing 3-5 steps with a railing? : Total 6 Click Score: 18    End of Session Equipment Utilized During Treatment: Gait belt Activity Tolerance: Patient tolerated treatment well Patient left: in chair;with call bell/phone within reach;with chair alarm set Nurse Communication: Mobility status PT Visit Diagnosis: Unsteadiness on feet (R26.81);Other abnormalities of gait and mobility (R26.89);Muscle weakness (generalized) (M62.81);History of falling (Z91.81);Difficulty in walking, not elsewhere classified (R26.2)    Time: [id number] PT Time Calculation (min) (ACUTE ONLY): 30 min   Charges:   PT Evaluation $PT Eval Moderate Complexity: 1 Mod PT Treatments $Gait Training: 8-22 mins        Adelfa Lozito B. Beverely RisenVan Fleet PT, DPT Acute Rehabilitation Services Pager [phone] Office [phone]   Elon Alaslizabeth B Van Kuakini Medical CenterFleet 03/11/2019, 2:53 PM

## 2019-03-11 NOTE — Progress Notes (Signed)
Daughter, Jeannetta Nap called and asked why she could not come see her father. I advised of the no visitor rules at this time per hospital policy. She said her and her sister spoke to the director who said they could come up 1 day each and she has not been able to come until today. I spoke to our [employer] who said she did speak to them but only allowed to come in  Monday when family was wanting to speak to the doctor and/or someone wanted to come and stay with patient and that person would not be able to come and go. Miranda said they are unable to stay all the time as they have kids and she was told she could come 1 day "ive been told several different things" I again spoke to [facility] and advised that no visitors per hospital policy and again it was only offered the day before his procedure to talk to the doctor. Miranda verbalized understanding but disappointment

## 2019-03-12 DIAGNOSIS — I350 Nonrheumatic aortic (valve) stenosis: Secondary | ICD-10-CM

## 2019-03-12 MED ORDER — POLYETHYLENE GLYCOL 3350 17 G PO PACK
17.0000 g | PACK | Freq: Every day | ORAL | Status: DC
  Administered 2019-03-12 – 2019-03-19 (×7): 17 g via ORAL
  Filled 2019-03-12 (×8): qty 1

## 2019-03-12 NOTE — Consult Note (Addendum)
[address]       [place] [address]             [phone]          CARDIOTHORACIC SURGERY CONSULTATION REPORT  PCP is Dettinger, Elige Radon, MD Primary Cardiologist is Prentice Docker, MD  Reason for consultation:  Severe aortic stenosis  HPI:  Patient is an 85 year old male with history of aortic stenosis, pectus excavatum with severe scoliosis, hypertension, abdominal aortic aneurysm, stage III chronic kidney disease, anemia, delusional disorder, iatrogenic hypothyroidism following thyroidectomy, and mild dementia who has been referred for surgical consultation to discuss treatment options for management of severe symptomatic aortic stenosis.  Patient states that he was first told he had a heart murmur in the 1950s when he underwent physical examination at the time he joined the [employer].  In 2015 he underwent transthoracic echocardiogram and was referred for preoperative cardiac clearance prior to thyroidectomy because presence of a heart murmur.  Echo at that time revealed mild aortic stenosis with normal left ventricular systolic function.  In 2018 he was referred to Dr. Purvis Sheffield for evaluation by his primary care physician to establish long-term follow-up for aortic stenosis.  Echocardiogram performed at that time revealed normal left ventricular systolic function with moderate aortic stenosis and moderate aortic insufficiency.  Continued follow-up was recommended.  In 2019 the patient was elevated evaluated in the emergency department for atypical chest pain in the setting of severe hypertension.  Troponins were normal.  He was mildly anemic.  He was prescribed tramadol for presumed musculoskeletal pain.  Routine follow-up echocardiogram performed May 2020 revealed further progression of disease with severe aortic stenosis.  Peak velocity across the aortic valve was reported 3.2 m/s corresponding to mean transvalvular gradient 24 mmHg, but the DVI  was notably quite low at 0.23, stroke-volume index 31.93, and aortic valve area calculated 0.75 cm, consistent with paradoxical low gradient with preserved ejection fraction severe aortic stenosis.  The patient was evaluated by Dr. Purvis Sheffield on February 24, 2019 and reported worsening exertional shortness of breath as well as occasional spells of "weakness and paleness" per the patient's daughters recollection.  On March 06, 2019 the patient suffered a syncopal event and fell down striking his head, resulting in a laceration and contusion of the left side of his face.  He was taken to [facility] rocking him for evaluation where he was found to be in sinus rhythm.  Troponin was mildly elevated 0.02.  Head CT revealed no intracranial abnormality.  His facial laceration was sutured closed.  He was notably hypertensive at the time.  He was transferred to [facility] where he ruled out for acute myocardial infarction.  He was evaluated by Dr. Clifton James and underwent diagnostic cardiac catheterization on March 10, 2019.  Catheterization revealed mild nonobstructive coronary artery disease.  Attempts to cross the aortic valve were unsuccessful.  CT angiography was performed and cardiothoracic surgical consultation requested.  Patient is poor historian.  He apparently worked for over 30 years in the [employer] but has been retired for more than 10 years ago.  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  He does not drive an automobile.  He does walk around although he uses 8 of a cane or walker for mobility because of an stable gait.  Patient denies any chest pain or shortness of breath with activity or at rest.  He has not had orthopnea or lower extremity edema.  He does report occasional dizzy spells prior to his syncopal event.  His daughter reportedly admits to some degree of dementia although states that the patient overall remains reasonably functional.  Past Medical History:  Diagnosis Date   AAA (abdominal  aortic aneurysm) without rupture ([place])    a. [place] (11/03/2013): 3.9 x 3.3 cm; b. 01/2018 CT chest: mild dil of Abd Ao- 3.9cm.   Chronic abdominal pain    CKD (chronic kidney disease), stage III (HCC)    Coronary artery calcification    a. 01/2018 Chest CT: Cor Ca2+ noted.   Delusional disorder ([place])    chronic   Essential hypertension, benign    History of goiter    Paranoia (HCC)    Right inguinal hernia    to see Dr. Romona Curls for repair   Severe aortic stenosis    a. 06/2018 Echo: EF 60-65%, Gr1 DD. Mod to sev AS. At least mod MR; b. 01/2019 Echo: EF 55-60%. No rwma. Nl RV fxn. Sev AoV Ca2+ w/ Sev AS. Mean grad only 2mmHg but AoV area 0.75cm^2 (Vmax).    Past Surgical History:  Procedure Laterality Date   CATARACT EXTRACTION, BILATERAL     EYE SURGERY Left 2004   Cataract    EYE SURGERY Right 2004   Cataract    INGUINAL HERNIA REPAIR Right 12/17/2013   Procedure: HERNIA REPAIR INGUINAL ADULT;  Surgeon: Scherry Ran, MD;  Location: [facility];  Service: General;  Laterality: Right;   RIGHT HEART CATH AND CORONARY ANGIOGRAPHY N/A 03/10/2019   Procedure: RIGHT HEART CATH AND CORONARY ANGIOGRAPHY;  Surgeon: Burnell Blanks, MD;  Location: [facility];  Service: Cardiovascular;  Laterality: N/A;   THYROIDECTOMY  03/02/2011   Left lobectomy and isthmusectomy -     Family History  Problem Relation Age of Onset   Heart attack Father    Thyroid cancer Father    Dementia Mother    Stroke Mother    Diabetes Mother    Varicose Veins Mother    Thyroid cancer Mother    Alzheimer's disease Mother    Diabetes Brother    Diabetes Daughter    Hyperlipidemia Daughter    Hypertension Daughter    Varicose Veins Daughter    Hyperlipidemia Son    Hypertension Son    Hyperlipidemia Daughter    Alzheimer's disease Sister    Cancer Brother        thyroid   Heart attack Brother     Social History   Socioeconomic History   Marital status:  Divorced    Spouse name: Not on file   Number of children: 5   Years of education: 13   Highest education level: Associate degree: occupational, Hotel manager, or vocational program  Occupational History   Not on file  Social Needs   Financial resource strain: Not very hard   Food insecurity    Worry: Never true    Inability: Never true   Transportation needs    Medical: No    Non-medical: No  Tobacco Use   Smoking status: Former Smoker    Quit date: 08/12/1983    Years since quitting: 35.6   Smokeless tobacco: Never Used  Substance and Sexual Activity   Alcohol use: No   Drug use: No   Sexual activity: Not Currently  Lifestyle   Physical activity    Days per week: 0 days    Minutes per session: 0 min   Stress: Only a little  Relationships  Social Musicianconnections    Talks on phone: More than three times a week    Gets together: More than three times a week    Attends religious service: 1 to 4 times per year    Active member of club or organization: No    Attends meetings of clubs or organizations: Never    Relationship status: Divorced   Intimate partner violence    Fear of current or ex partner: No    Emotionally abused: No    Physically abused: No    Forced sexual activity: No  Other Topics Concern   Not on file  Social History Narrative   Patient is divorced and lives in a one story home by himself. He has neighbors that visit and his children visit often and take him out to eat. He is retired and spent about 8 years in the [employer]. He is able to read/write and speak [place].     Prior to Admission medications   Medication Sig Start Date End Date Taking? Authorizing Provider  acetaminophen (TYLENOL) 500 MG tablet Take 1,000 mg by mouth every 6 (six) hours as needed for headache (pain).   Yes [provider]  furosemide (LASIX) 20 MG tablet Take 1 tablet (20 mg total) by mouth as needed. Patient taking differently: Take 20 mg by mouth daily  as needed for fluid or edema.  02/24/19  Yes Laqueta LindenKoneswaran, Suresh A, MD  lisinopril (ZESTRIL) 10 MG tablet Take 1 tablet (10 mg total) by mouth daily. 02/24/19 05/25/19 Yes Laqueta LindenKoneswaran, Suresh A, MD  loratadine (CLARITIN) 10 MG tablet Take 1 tablet (10 mg total) by mouth daily. Patient taking differently: Take 10 mg by mouth daily as needed for allergies.  02/16/19  Yes Dettinger, Elige RadonJoshua A, MD  Memantine HCl-Donepezil HCl (NAMZARIC) 28-10 MG CP24 Take 1 capsule by mouth daily. 02/16/19  Yes Dettinger, Elige RadonJoshua A, MD  Multiple Vitamin (MULTIVITAMIN WITH MINERALS) TABS tablet Take 1 tablet by mouth daily.   Yes [provider]  naproxen sodium (ALEVE) 220 MG tablet Take 220 mg by mouth 2 (two) times daily as needed (pain/headache).   Yes [provider]  sertraline (ZOLOFT) 50 MG tablet Take 1 tablet (50 mg total) by mouth daily. 02/16/19  Yes Dettinger, Elige RadonJoshua A, MD    Current Facility-Administered Medications  Medication Dose Route Frequency Provider Last Rate Last Dose   0.9 %  sodium chloride infusion  250 mL Intravenous PRN Kathleene HazelMcAlhany, Christopher D, MD       0.9 %  sodium chloride infusion  250 mL Intravenous PRN Kathleene HazelMcAlhany, Christopher D, MD       acetaminophen (TYLENOL) tablet 650 mg  650 mg Oral Q4H PRN Kathleene HazelMcAlhany, Christopher D, MD       amLODipine (NORVASC) tablet 5 mg  5 mg Oral Daily Kathleene HazelMcAlhany, Christopher D, MD   5 mg at 03/12/19 [id number]   memantine (NAMENDA XR) 24 hr capsule 28 mg  28 mg Oral QHS Kathleene HazelMcAlhany, Christopher D, MD   28 mg at 03/11/19 2048   And   donepezil (ARICEPT) tablet 10 mg  10 mg Oral QHS Kathleene HazelMcAlhany, Christopher D, MD   10 mg at 03/11/19 2047   enoxaparin (LOVENOX) injection 40 mg  40 mg Subcutaneous Q24H Kathleene HazelMcAlhany, Christopher D, MD   40 mg at 03/12/19 [id number]   feeding supplement (ENSURE ENLIVE) (ENSURE ENLIVE) liquid 237 mL  237 mL Oral BID BM Kathleene HazelMcAlhany, Christopher D, MD   237 mL at 03/12/19 1458   multivitamin with  minerals tablet 1 tablet  1 tablet Oral Daily  Kathleene Hazel, MD   1 tablet at 03/12/19 0921   ondansetron [facility]) injection 4 mg  4 mg Intravenous Q6H PRN Kathleene Hazel, MD       polyethylene glycol (MIRALAX / GLYCOLAX) packet 17 g  17 g Oral Daily Purcell Nails, MD   17 g at 03/12/19 1610   sertraline (ZOLOFT) tablet 50 mg  50 mg Oral Daily Kathleene Hazel, MD   50 mg at 03/12/19 9604   sodium chloride flush (NS) 0.9 % injection 3 mL  3 mL Intravenous Q12H Kathleene Hazel, MD   3 mL at 03/12/19 5409   sodium chloride flush (NS) 0.9 % injection 3 mL  3 mL Intravenous PRN Kathleene Hazel, MD       sodium chloride flush (NS) 0.9 % injection 3 mL  3 mL Intravenous Q12H Kathleene Hazel, MD   3 mL at 03/12/19 8119   sodium chloride flush (NS) 0.9 % injection 3 mL  3 mL Intravenous Q12H Kathleene Hazel, MD   3 mL at 03/12/19 1478   sodium chloride flush (NS) 0.9 % injection 3 mL  3 mL Intravenous PRN Kathleene Hazel, MD        No Known Allergies    Review of Systems:  Per HPI and records of previous physician records     Physical Exam:   BP (!) 151/96 (BP Location: Left Arm)    Pulse 61    Temp 98.3 F (36.8 C) (Oral)    Resp 18    Ht 6' (1.829 m)    Wt 82.6 kg    SpO2 99%    BMI 24.70 kg/m   General:  Elderly and frail-appearing  HEENT:  Unremarkable, edentulous  Neck:   no JVD, no bruits, no adenopathy   Chest:   clear to auscultation, symmetrical breath sounds, no wheezes, no rhonchi   CV:   RRR, grade III/VI harsh systolic murmur   Abdomen:  soft, non-tender, no masses   Extremities:  warm, well-perfused, pulses palpable, no lower extremity edema  Rectal/GU  Deferred  Neuro:   Grossly non-focal and symmetrical throughout  Skin:   Clean and dry, no rashes, no breakdown  Diagnostic Tests:  Lab Results: Recent Labs    03/10/19 1051 03/11/19 0639  WBC  --  6.7  HGB 10.9* 11.0*  HCT 32.0* 33.9*  PLT  --  174   BMET:  Recent Labs     03/10/19 0433  03/10/19 1051 03/11/19 0639  NA 133*   < > 139 136  K 5.0   < > 4.1 5.1  CL 97*  --   --  100  CO2 29  --   --  29  GLUCOSE 91  --   --  91  BUN 22  --   --  22  CREATININE 1.27*  --   --  1.16  CALCIUM 8.6*  --   --  8.8*   < > = values in this interval not displayed.    CBG (last 3)  No results for input(s): GLUCAP in the last 72 hours. PT/INR:  No results for input(s): LABPROT, INR in the last 72 hours.  CXR:  N/A     ECHOCARDIOGRAM REPORT       Patient Name:   DELOSS AMICO Date of Exam: 01/28/2019 Medical Rec #:  [id number]  Height:       66.0 in Accession #:    [id number]        Weight:       145.2 lb Date of Birth:  02-27-1932        BSA:          1.75 m Patient Age:    86 years          BP:           144/72 mmHg Patient Gender: M                 HR:           66 bpm. Exam Location:  [facility]    Procedure: 2D Echo, Cardiac Doppler and Color Doppler  Indications:     R01.1, I35.0   History:         Patient has prior history of Echocardiogram examinations, most                  recent 07/03/2018. Aortic Valve Disease Signs/Symptoms: Murmur                  Risk Factors: Former Smoker and Hypertension.   Sonographer:     Jake Seats RDMS, RVT, RDCS Referring Phys:  Prentice Docker, A Diagnosing Phys: Nona Dell MD    Sonographer Comments: Technically difficult study due to poor echo windows. Chest wall severely kyphotic and Image acquisition challenging due to patient body habitus. IMPRESSIONS    1. The left ventricle has normal systolic function, with an ejection fraction of 55-60%. The cavity size was normal. There is mildly increased left ventricular wall thickness. Left ventricular diastolic Doppler parameters are consistent with impaired  relaxation. No evidence of left ventricular regional wall motion abnormalities.  2. The right ventricle has normal systolic function. The cavity was normal. There is no increase in  right ventricular wall thickness.  3. Small pericardial effusion.  4. The pericardial effusion is anterior to the right ventricle. Also possible left pleural effusion.  5. The aortic valve has an indeterminate number of cusps. Severe calcifcation of the aortic valve. Suspect evere stenosis of the aortic valve. Mean gradient is only 24 mmHg, but the calculated valve area and dimentionless index both are consistent with  severe stenosis. Moderate aortic annular calcification noted.  6. The mitral valve is degenerative. Mild thickening of the mitral valve leaflet. There is mild mitral annular calcification present. Mitral valve regurgitation is moderate by color flow Doppler.  7. The tricuspid valve is grossly normal.  8. The aortic root is normal in size and structure.  FINDINGS  Left Ventricle: The left ventricle has normal systolic function, with an ejection fraction of 55-60%. The cavity size was normal. There is mildly increased left ventricular wall thickness. Left ventricular diastolic Doppler parameters are consistent  with impaired relaxation. No evidence of left ventricular regional wall motion abnormalities..  Right Ventricle: The right ventricle has normal systolic function. The cavity was normal. There is no increase in right ventricular wall thickness.  Left Atrium: Left atrial size was normal in size.  Right Atrium: Right atrial size was normal in size. Right atrial pressure is estimated at 10 mmHg.  Interatrial Septum: No atrial level shunt detected by color flow Doppler.  Pericardium: A small pericardial effusion is present. The pericardial effusion is anterior to the right ventricle. There is pleural effusion in the left lateral region.  Mitral Valve: The mitral valve is degenerative in appearance. Mild thickening  of the mitral valve leaflet. There is mild mitral annular calcification present. Mitral valve regurgitation is moderate by color flow Doppler.  Tricuspid  Valve: The tricuspid valve is grossly normal. Tricuspid valve regurgitation is mild by color flow Doppler.  Aortic Valve: The aortic valve has an indeterminate number of cusps Severe calcifcation of the aortic valve. Aortic valve regurgitation was not visualized by color flow Doppler. There is Severe stenosis of the aortic valve, with a calculated valve area  of 0.74 cm. Moderate aortic annular calcification noted.  Pulmonic Valve: The pulmonic valve was grossly normal. Pulmonic valve regurgitation is trivial by color flow Doppler.  Aorta: The aortic root is normal in size and structure.  Venous: The inferior vena cava was not well visualized.  Compared to previous exam: Echocardiogram done 07/03/18 showed an EF of 65% with moderate to severe AS with an AV Peak Grad of 44 mmHg.    +--------------+--------++  LEFT VENTRICLE                 +----------------+---------++ +--------------+--------++       Diastology                    PLAX 2D                        +----------------+---------++ +--------------+--------++       LV e' lateral:   5.90 cm/s    LVIDd:         4.49 cm         +----------------+---------++ +--------------+--------++       LV E/e' lateral: 11.1         LVIDs:         3.03 cm         +----------------+---------++ +--------------+--------++       LV e' medial:    5.32 cm/s    LV PW:         1.12 cm         +----------------+---------++ +--------------+--------++       LV E/e' medial:  12.3         LV IVS:        0.67 cm         +----------------+---------++ +--------------+--------++  LVOT diam:     2.00 cm    +--------------+--------++  LV SV:         56 ml      +--------------+--------++  LV SV Index:   31.93      +--------------+--------++  LVOT Area:     3.14 cm   +--------------+--------++                            +--------------+--------++   +------------------+---------++  LV Volumes (MOD)               +------------------+---------++  LV  area d, A2C:    32.60 cm   +------------------+---------++  LV area d, A4C:    34.20 cm   +------------------+---------++  LV area s, A2C:    19.70 cm   +------------------+---------++  LV area s, A4C:    19.50 cm   +------------------+---------++  LV major d, A2C:   9.19 cm     +------------------+---------++  LV major d, A4C:   9.49 cm     +------------------+---------++  LV major s, A2C:   7.69 cm     +------------------+---------++  LV major s, A4C:   8.00 cm     +------------------+---------++  LV vol d, MOD A2C: 98.1 ml     +------------------+---------++  LV vol d, MOD A4C: 102.0 ml    +------------------+---------++  LV vol s, MOD A2C: 46.9 ml     +------------------+---------++  LV vol s, MOD A4C: 42.3 ml     +------------------+---------++  LV SV MOD A2C:     51.2 ml     +------------------+---------++  LV SV MOD A4C:     102.0 ml    +------------------+---------++  LV SV MOD BP:      56.7 ml     +------------------+---------++  +---------------+----------++  RIGHT VENTRICLE              +---------------+----------++  RV S prime:     11.00 cm/s   +---------------+----------++  TAPSE (M-mode): 2.1 cm       +---------------+----------++  +-----------+-------++----------++  LEFT ATRIUM          Index        +-----------+-------++----------++  LA diam:    4.10 cm  2.35 cm/m   +-----------+-------++----------++  +------------------+------------++  AORTIC VALVE                      +------------------+------------++  AV Area (Vmax):    0.75 cm       +------------------+------------++  AV Area (Vmean):   0.75 cm       +------------------+------------++  AV Area (VTI):     0.74 cm       +------------------+------------++  AV Vmax:           323.50 cm/s    +------------------+------------++  AV Vmean:          231.500 cm/s   +------------------+------------++  AV VTI:            0.743 m        +------------------+------------++  AV Peak Grad:       41.9 mmHg      +------------------+------------++  AV Mean Grad:      24.0 mmHg      +------------------+------------++  LVOT Vmax:         77.60 cm/s     +------------------+------------++  LVOT Vmean:        55.200 cm/s    +------------------+------------++  LVOT VTI:          0.174 m        +------------------+------------++  LVOT/AV VTI ratio: 0.23           +------------------+------------++   +-------------+-------++  AORTA                   +-------------+-------++  Ao Root diam: 3.40 cm   +-------------+-------++  +--------------+----------++  +---------------+-----------++  MITRAL VALVE                  TRICUSPID VALVE               +--------------+----------++  +---------------+-----------++  MV Area (PHT): 2.30 cm       TR Peak grad:   14.4 mmHg     +--------------+----------++  +---------------+-----------++  MV PHT:        95.70 msec     TR Vmax:        255.00 cm/s   +--------------+----------++  +---------------+-----------++  MV Decel Time: 330 msec     +--------------+----------++  +--------------+-------+ +-------------+-----------++   SHUNTS                   MR Peak grad: 62.2 mmHg      +--------------+-------+ +-------------+-----------++  Systemic VTI:  0.17 m    MR Vmax:      394.33 cm/s    +--------------+-------+ +-------------+-----------++   Systemic Diam: 2.00 cm  +--------------+-----------++ +--------------+-------+  MV E velocity: 65.50 cm/s    +--------------+-----------++  MV A velocity: 114.00 cm/s   +--------------+-----------++  MV E/A ratio:  0.57          +--------------+-----------++    Nona Dell MD Electronically signed by Nona Dell MD Signature Date/Time: 01/28/2019/12:10:30 PM     RIGHT HEART CATH AND CORONARY ANGIOGRAPHY  Conclusion    Ost RCA to Prox RCA lesion is 30% stenosed.  Prox Cx to Mid Cx lesion is 20% stenosed.  Mid LAD lesion is 20% stenosed.   1. Mild non-obstructive CAD 2. Unable to  cross into the LV with a diagnostic catheter. The AL-1 was too short given his height and arm length). The long pigtail would not cross the valve given the severe angulation of the horizontal aortic root.   Recommendations: Will continue inpatient workup for TAVR. Will plans CT scans tomorrow if renal function is stable. Dr. Cornelius Moras will also see over the next day.    Recommendations  Antiplatelet/Anticoag Will continue inpatient workup for TAVR  Indications  Severe aortic stenosis [I35.0 (ICD-10-CM)]  Procedural Details  Technical Details Indication: Syncope, Severe AS  Procedure: The risks, benefits, complications, treatment options, and expected outcomes were discussed with the patient. The patient and/or family concurred with the proposed plan, giving informed consent. The patient was brought to the cath lab after IV hydration was given. The patient was sedated with Versed and Fentanyl. The IV catheter present in the right antecubital vein was changed for a 5 [place] sheath. Right heart cath performed with a balloon tipped catheter. The right wrist was prepped and draped in a sterile fashion. 1% lidocaine was used for local anesthesia. Using the modified Seldinger access technique, a 5 French sheath was placed in the right radial artery. 3 mg Verapamil was given through the sheath. IV heparin was given. Standard diagnostic catheters were used to perform selective coronary angiography. I was able to cross the aortic valve but due to the horizontal orientation of his aortic root, I was unable to pass the pigtail catheter into the LV. The sheath was removed from the right radial artery and a Terumo hemostasis band was applied at the arteriotomy site on the right wrist.    Estimated blood loss <50 mL.   During this procedure medications were administered to achieve and maintain moderate conscious sedation while the patient's heart rate, blood pressure, and oxygen saturation were continuously  monitored and I was present face-to-face 100% of this time.  Medications (Filter: Administrations occurring from 03/10/19 1005 to 03/10/19 1139) (important)  Continuous medications are totaled by the amount administered until 03/10/19 1139.  Medication Rate/Dose/Volume Action  Date Time   fentaNYL (SUBLIMAZE) injection (mcg) 12.5 mcg Given 03/10/19 1036   Total dose as of 03/10/19 1139        12.5 mcg        midazolam (VERSED) injection (mg) 1 mg Given 03/10/19 1036   Total dose as of 03/10/19 1139        1 mg        lidocaine (PF) (XYLOCAINE) 1 % injection (mL) 2 mL Given 03/10/19 1042   Total dose as of 03/10/19 1139 2 mL Given 1043   4 mL        Radial Cocktail/Verapamil only (mL) 10 mL Given 03/10/19 1042  Total dose as of 03/10/19 1139        10 mL        heparin injection (Units) 3,000 Units Given 03/10/19 1052   Total dose as of 03/10/19 1139        3,000 Units        Heparin (Porcine) in NaCl 1000-0.9 UT/500ML-% SOLN (mL) 500 mL Given 03/10/19 1052   Total dose as of 03/10/19 1139 500 mL Given 1052   1,000 mL        iohexol (OMNIPAQUE) 350 MG/ML injection (mL) 85 mL Given 03/10/19 1123   Total dose as of 03/10/19 1139        85 mL        Sedation Time  Sedation Time Physician-1: 45 minutes 50 seconds  Complications  Complications documented before study signed (03/10/2019 11:40 AM)   RIGHT HEART CATH AND CORONARY ANGIOGRAPHY  None Documented by Kathleene Hazel, MD 03/10/2019 11:30 AM  Date Found: 03/10/2019  Time Range: Intraprocedure      Coronary Findings  Diagnostic Dominance: Right Left Anterior Descending  Vessel is large.  Mid LAD lesion 20% stenosed  Mid LAD lesion is 20% stenosed.  Left Circumflex  Vessel is large.  Prox Cx to Mid Cx lesion 20% stenosed  Prox Cx to Mid Cx lesion is 20% stenosed.  Right Coronary Artery  Vessel is large.  Ost RCA to Prox RCA lesion 30% stenosed  Ost RCA to Prox RCA lesion is 30% stenosed.  Intervention  No  interventions have been documented. Coronary Diagrams  Diagnostic Dominance: Right  Intervention  Implants   No implant documentation for this case.  Syngo Images  Show images for CARDIAC CATHETERIZATION  Images on Long Term Storage  Show images for Edd, Reppert to Procedure Log  Procedure Log    Hemo Data   Most Recent Value  RA A Wave 5 mmHg  RA V Wave 2 mmHg  RA Mean 1 mmHg  RV Systolic Pressure 41 mmHg  RV Diastolic Pressure 0 mmHg  RV EDP 4 mmHg  PA Systolic Pressure 41 mmHg  PA Diastolic Pressure 10 mmHg  PA Mean 20 mmHg  PW A Wave 27 mmHg  PW V Wave 17 mmHg  PW Mean 16 mmHg  AO Systolic Pressure 151 mmHg  AO Diastolic Pressure 66 mmHg  AO Mean 98 mmHg      Cardiac TAVR CT  TECHNIQUE: The patient was scanned on a [employer]. A 120 kV retrospective scan was triggered in the descending thoracic aorta at 111 HU's. Gantry rotation speed was 250 msecs and collimation was .6 mm. No beta blockade or nitro were given. The 3D data set was reconstructed in 5% intervals of the R-R cycle. Systolic and diastolic phases were analyzed on a dedicated work station using MPR, MIP and VRT modes. The patient received 80 cc of contrast.  FINDINGS: Aortic Valve: Trileaflet aortic valve with severely thickened and calcified leaflets and severely restricted leaflets opening and mild asymmetric calcifications extending into the LVOT under the left coronary sinus.  Aorta: Normal size with moderate diffuse atherosclerotic plaque and calcifications. No dissection. Tortuous course of the abdominal aorta.  Sinotubular Junction: 33 x 33 mm  Ascending Thoracic Aorta: 39 x 38 mm  Aortic Arch: 33 x 29 mm  Descending Thoracic Aorta: 29 x 27 mm  Sinus of Valsalva Measurements:  Non-coronary: 37 mm  Right -coronary: 36 mm  Left -coronary: 36 mm  Coronary  Artery Height above Annulus:  Left Main: 19 mm  Right Coronary: 17  mm  Virtual Basal Annulus Measurements:  Maximum/Minimum Diameter: 30.6 x 25.2 mm  Mean Diameter: 26.7 mm  Perimeter: 86 mm  Area: 558 mm2  Optimum Fluoroscopic Angle for Delivery: RAO 4 CAU 43  IMPRESSION: 1. Trileaflet aortic valve with severely thickened and calcified leaflets and severely restricted leaflets opening and mild asymmetric calcifications extending into the LVOT under the left coronary sinus. Annular measurements suitable for delivery of a 29 mm Edwards-SAPIEN 3 valve.  Aortic valve calcium score 5172 consistent with severe aortic stenosis.  2. Sufficient coronary to annulus distance.  3. Optimum Fluoroscopic Angle for Delivery:  RAO 4 CAU 43.  4. No thrombus in the left atrial appendage.   Electronically Signed   By: Tobias AlexanderKatarina  Nelson   On: 03/11/2019 12:48    CT ANGIOGRAPHY CHEST, ABDOMEN AND PELVIS  TECHNIQUE: Multidetector CT imaging through the chest, abdomen and pelvis was performed using the standard protocol during bolus administration of intravenous contrast. Multiplanar reconstructed images and MIPs were obtained and reviewed to evaluate the vascular anatomy.  CONTRAST:  100mL OMNIPAQUE IOHEXOL 350 MG/ML SOLN  COMPARISON:  Chest CT 01/30/2018. CT the abdomen and pelvis 12/03/2016.  FINDINGS: CTA CHEST FINDINGS  Cardiovascular: Heart size is normal. Small amount of pericardial fluid and/or thickening, unlikely to be of any hemodynamic significance at this time. No associated pericardial calcification. There is aortic atherosclerosis, as well as atherosclerosis of the great vessels of the mediastinum and the coronary arteries, including calcified atherosclerotic plaque in the left main and left anterior descending coronary arteries. Ectasia of ascending thoracic aorta (4.0 cm in diameter). Severe thickening calcification of the aortic valve.  Mediastinum/Lymph Nodes: No pathologically enlarged mediastinal  or hilar lymph nodes. Esophagus is unremarkable in appearance. No axillary lymphadenopathy.  Lungs/Pleura: Several small pulmonary nodules are noted in the right lung, largest of which is in the periphery of the right lower lobe (axial image 61 of series 17) measuring 10 x 8 mm (mean diameter of 9 mm). No acute consolidative airspace disease. No pleural effusions.  Musculoskeletal/Soft Tissues: Chronic compression fracture of T8 with 90% loss of anterior vertebral body height. New compression fracture of T12 with 40% loss of anterior vertebral body height, which does not appear acute. There are no aggressive appearing lytic or blastic lesions noted in the visualized portions of the skeleton.  CTA ABDOMEN AND PELVIS FINDINGS  Hepatobiliary: No suspicious cystic or solid hepatic lesions. No intra or extrahepatic biliary ductal dilatation. Gallbladder is normal in appearance.  Pancreas: No pancreatic mass. No pancreatic ductal dilatation. No pancreatic or peripancreatic fluid or inflammatory changes.  Spleen: Unremarkable.  Adrenals/Urinary Tract: Mild diffuse cortical atrophy in the left kidney. Right kidney and adrenal glands are normal in appearance. No hydroureteronephrosis. Urinary bladder is normal in appearance.  Stomach/Bowel: Normal appearance of the stomach. No pathologic dilatation of small bowel or colon. Numerous colonic diverticulae are noted, without surrounding inflammatory changes to suggest an acute diverticulitis at this time. The appendix is not confidently identified and may be surgically absent. Regardless, there are no inflammatory changes noted adjacent to the cecum to suggest the presence of an acute appendicitis at this time.  Vascular/Lymphatic: Aortic atherosclerosis with fusiform aneurysmal dilatation of the infrarenal abdominal aorta which measures up to 3.7 x 4.0 cm. Vascular findings and measurements pertinent to potential TAVR  procedure, as detailed below. No lymphadenopathy noted in the abdomen or pelvis.  Reproductive: Prostate gland and  seminal vesicles are unremarkable in appearance.  Other: No significant volume of ascites.  No pneumoperitoneum.  Musculoskeletal: Chronic compression fracture of L4 with 30% loss of anterior vertebral body height. There are no aggressive appearing lytic or blastic lesions noted in the visualized portions of the skeleton.  VASCULAR MEASUREMENTS PERTINENT TO TAVR:  AORTA:  Minimal Aortic Diameter-20 x 14 mm  Severity of Aortic Calcification-severe  RIGHT PELVIS:  Right Common Iliac Artery -  Minimal Diameter-10.3 x 8.3 mm  Tortuosity-moderate to severe  Calcification-moderate  Right External Iliac Artery -  Minimal Diameter-8.3 x 7.2 mm  Tortuosity-mild-to-moderate  Calcification-mild  Right Common Femoral Artery -  Minimal Diameter-8.3 x 8.3 mm  Tortuosity-mild  Calcification-mild  LEFT PELVIS:  Left Common Iliac Artery -  Minimal Diameter-8.4 x 8.6 mm  Tortuosity-mild  Calcification-moderate  Left External Iliac Artery -  Minimal Diameter-7.7 x 7.9 mm  Tortuosity-mild  Calcification-mild  Left Common Femoral Artery -  Minimal Diameter-8.2 x 8.0 mm  Tortuosity-mild  Calcification-moderate  Review of the MIP images confirms the above findings.  IMPRESSION: 1. Vascular findings and measurements pertinent to potential TAVR procedure, as detailed above. 2. Severe thickening calcification of the aortic valve, compatible with the reported clinical history of aortic stenosis. 3. Aortic atherosclerosis, in addition to left main and left anterior descending coronary artery disease. There is also ectasia of the ascending thoracic aorta (4.0 cm in diameter), as well as fusiform aneurysmal dilatation of the infrarenal abdominal aorta (3.7 x 4.0 cm). Recommend followup by ultrasound in 2 years.  This recommendation follows ACR consensus guidelines: White Paper of the ACR Incidental Findings Committee II on Vascular Findings. J Am Coll Radiol 2013; 10:789-794. 4. Multiple small pulmonary nodules in the right lung, largest of which measures up to a mean diameter of 9 mm in the right lower lobe. Non-contrast chest CT at 3-6 months is recommended. If the nodules are stable at time of repeat CT, then future CT at 18-24 months (from today's scan) is considered optional for low-risk patients, but is recommended for high-risk patients. This recommendation follows the consensus statement: Guidelines for Management of Incidental Pulmonary Nodules Detected on CT Images: From the Fleischner Society 2017; Radiology 2017; 284:228-243. 5. Colonic diverticulosis without evidence of acute diverticulitis at this time. 6. Additional incidental findings, as above.   Electronically Signed   By: Trudie Reed M.D.   On: 03/11/2019 12:27    EKG: NSR w/out significant AV conduction delay      Impression:  Patient has stage D severe symptomatic aortic stenosis.  The patient is a difficult historian but does admit to symptoms of exertional shortness of breath consistent with chronic diastolic congestive heart failure, [place] Heart Association functional class II.  He presented with a frank syncopal event during this hospital admission.  I personally reviewed the patient's transthoracic echocardiogram, diagnostic cardiac catheterization, and CT angiograms.  Echocardiogram demonstrates paradoxical low flow, low gradient with preserved ejection fraction severe aortic stenosis.  The aortic valve is trileaflet with severe thickening, calcification, and restricted leaflet mobility involving all 3 leaflets of the aortic valve.  Peak velocity across aortic valve ranges between 3.2 and 4.0 m/s.  The DVI is quite low as is the stroke-volume index.  Left ventricular systolic function appears preserved.   Diagnostic cardiac catheterization is notable for the absence of significant coronary artery disease.  I agree the patient would benefit from aortic valve replacement.  However, I would not consider this patient a candidate for conventional surgical  valve replacement under any circumstances because of his advanced age, frail physical stature, and mild dementia.  Cardiac-gated CTA of the heart reveals anatomical characteristics consistent with aortic stenosis suitable for treatment by transcatheter aortic valve replacement without any significant complicating features.  CTA of the aorta and iliac vessels demonstrate what appears to be adequate pelvic vascular access to facilitate a transfemoral approach, although the patient's thoracic and abdominal aorta are quite tortuous and he has a moderate sized (3.7 x 4.0 cm) infrarenal abdominal aortic aneurysm.     Plan:  The patient was counseled regarding treatment alternatives for management of severe symptomatic aortic stenosis. Alternative approaches such as conventional aortic valve replacement, transcatheter aortic valve replacement, and continued medical therapy without intervention were compared and contrasted at length.  The risks associated with conventional surgical aortic valve replacement were discussed in detail, as were expectations for post-operative convalescence, and why I would be reluctant to consider this patient a candidate for conventional surgery.  Issues specific to transcatheter aortic valve replacement were discussed including questions about long term valve durability, the potential for paravalvular leak, possible increased risk of need for permanent pacemaker placement, and other technical complications related to the procedure itself.  Long-term prognosis with medical therapy was discussed. This discussion was placed in the context of the patient's own specific clinical presentation and past medical history.  All of their questions have  been addressed.  The patient seems to have reasonable understanding of his medical condition and desires to proceed with surgical intervention during this hospitalization.  We tentatively plan to proceed with TAVR on Tuesday, March 17, 2019.    I spent in excess of 90 minutes during the conduct of this hospital consultation and >50% of this time involved direct face-to-face encounter for counseling and/or coordination of the patient's care.    Salvatore Decentlarence H. Cornelius Moraswen, MD 03/12/2019 6:16 PM

## 2019-03-12 NOTE — Care Management Important Message (Signed)
Important Message  Patient Details  Name: Dan Perez MRN: [id number] Date of Birth: Aug 01, 1932   Medicare Important Message Given:  Yes     Shelda Altes 03/12/2019, 1:18 PM

## 2019-03-12 NOTE — Progress Notes (Signed)
Pt was originally planned for TAVR tmrw per notes. TAVR scheduled for 03/17/19 per OR schedule. RN called OR to confirm that patient is not on schedule for 03/12/29. No pre-op orders currently. Will continue to monitor.

## 2019-03-12 NOTE — Progress Notes (Signed)
Progress Note  Patient Name: Dan Perez Date of Encounter: 03/12/2019  Primary Cardiologist: Prentice Docker, MD   Subjective   Feels well.  Did not recall anyone coming to talk to him about TAVR.   Inpatient Medications    Scheduled Meds:  amLODipine  5 mg Oral Daily   memantine  28 mg Oral QHS   And   donepezil  10 mg Oral QHS   enoxaparin (LOVENOX) injection  40 mg Subcutaneous Q24H   feeding supplement (ENSURE ENLIVE)  237 mL Oral BID BM   multivitamin with minerals  1 tablet Oral Daily   polyethylene glycol  17 g Oral Daily   sertraline  50 mg Oral Daily   sodium chloride flush  3 mL Intravenous Q12H   sodium chloride flush  3 mL Intravenous Q12H   sodium chloride flush  3 mL Intravenous Q12H   Continuous Infusions:  sodium chloride     sodium chloride     PRN Meds: sodium chloride, sodium chloride, acetaminophen, ondansetron (ZOFRAN) IV, sodium chloride flush, sodium chloride flush   Vital Signs    Vitals:   03/11/19 1130 03/11/19 2020 03/12/19 0055 03/12/19 0433  BP: (!) 156/91 109/63  (!) 142/85  Pulse: 63   (!) 55  Resp: 18 17    Temp: 98.4 F (36.9 C) (!) 97.5 F (36.4 C)  97.9 F (36.6 C)  TempSrc: Oral Oral  Oral  SpO2: 99% 99%  99%  Weight:   82.6 kg   Height:        Intake/Output Summary (Last 24 hours) at 03/12/2019 1023 Last data filed at 03/12/2019 0824 Gross per 24 hour  Intake 1020 ml  Output 1450 ml  Net -430 ml   Last 3 Weights 03/12/2019 03/11/2019 03/10/2019  Weight (lbs) 182 lb 1.6 oz 139 lb 4.8 oz 139 lb 4.8 oz  Weight (kg) 82.6 kg 63.186 kg 63.186 kg      Telemetry    NSR - Personally Reviewed  ECG      Physical Exam   GEN: No acute distress.   Neck: No JVD Cardiac: RRR,3/6 systolic murmurs, no rubs, or gallops.  Respiratory: Clear to auscultation bilaterally. GI: Soft, nontender, non-distended  MS: No edema; No deformity. Left face brusied Neuro:  Nonfocal  Psych: Normal affect   Labs      High Sensitivity Troponin:  No results for input(s): TROPONINIHS in the last 720 hours.    Cardiac Enzymes Recent Labs  Lab 03/06/19 2042  TROPONINI 0.03*   No results for input(s): TROPIPOC in the last 168 hours.   Chemistry Recent Labs  Lab 03/09/19 [id number] 03/10/19 0433 03/10/19 1045 03/10/19 1051 03/11/19 0639  NA 133* 133* 136 139 136  K 5.2* 5.0 4.4 4.1 5.1  CL 98 97*  --   --  100  CO2 29 29  --   --  29  GLUCOSE 94 91  --   --  91  BUN 20 22  --   --  22  CREATININE 1.32* 1.27*  --   --  1.16  CALCIUM 8.7* 8.6*  --   --  8.8*  GFRNONAA 49* 52*  --   --  58*  GFRAA 57* 60*  --   --  >60  ANIONGAP 6 7  --   --  7     Hematology Recent Labs  Lab 03/07/19 0427 03/10/19 1045 03/10/19 1051 03/11/19 0639  WBC 6.5  --   --  6.7  RBC 3.75*  --   --  3.75*  HGB 10.7* 11.9* 10.9* 11.0*  HCT 34.1* 35.0* 32.0* 33.9*  MCV 90.9  --   --  90.4  MCH 28.5  --   --  29.3  MCHC 31.4  --   --  32.4  RDW 13.6  --   --  13.6  PLT 178  --   --  174    BNPNo results for input(s): BNP, PROBNP in the last 168 hours.   DDimer No results for input(s): DDIMER in the last 168 hours.   Radiology    Ct Coronary Morph W/cta Cor W/score W/ca W/cm &/or Wo/cm  Addendum Date: 03/11/2019   ADDENDUM REPORT: 03/11/2019 12:48 CLINICAL DATA:  85 year old male with severe aortic stenosis being evaluated for a TAVR procedure. EXAM: Cardiac TAVR CT TECHNIQUE: The patient was scanned on a [employer]. A 120 kV retrospective scan was triggered in the descending thoracic aorta at 111 HU's. Gantry rotation speed was 250 msecs and collimation was .6 mm. No beta blockade or nitro were given. The 3D data set was reconstructed in 5% intervals of the R-R cycle. Systolic and diastolic phases were analyzed on a dedicated work station using MPR, MIP and VRT modes. The patient received 80 cc of contrast. FINDINGS: Aortic Valve: Trileaflet aortic valve with severely thickened and calcified leaflets and  severely restricted leaflets opening and mild asymmetric calcifications extending into the LVOT under the left coronary sinus. Aorta: Normal size with moderate diffuse atherosclerotic plaque and calcifications. No dissection. Tortuous course of the abdominal aorta. Sinotubular Junction: 33 x 33 mm Ascending Thoracic Aorta: 39 x 38 mm Aortic Arch: 33 x 29 mm Descending Thoracic Aorta: 29 x 27 mm Sinus of Valsalva Measurements: Non-coronary: 37 mm Right -coronary: 36 mm Left -coronary: 36 mm Coronary Artery Height above Annulus: Left Main: 19 mm Right Coronary: 17 mm Virtual Basal Annulus Measurements: Maximum/Minimum Diameter: 30.6 x 25.2 mm Mean Diameter: 26.7 mm Perimeter: 86 mm Area: 558 mm2 Optimum Fluoroscopic Angle for Delivery: RAO 4 CAU 43 IMPRESSION: 1. Trileaflet aortic valve with severely thickened and calcified leaflets and severely restricted leaflets opening and mild asymmetric calcifications extending into the LVOT under the left coronary sinus. Annular measurements suitable for delivery of a 29 mm Edwards-SAPIEN 3 valve. Aortic valve calcium score 5172 consistent with severe aortic stenosis. 2. Sufficient coronary to annulus distance. 3. Optimum Fluoroscopic Angle for Delivery:  RAO 4 CAU 43. 4. No thrombus in the left atrial appendage. Electronically Signed   By: Tobias Alexander   On: 03/11/2019 12:48   Result Date: 03/11/2019 EXAM: OVER-READ INTERPRETATION  CT CHEST The following report is an over-read performed by radiologist Dr. Trudie Reed of [facility] on 03/11/2019. This over-read does not include interpretation of cardiac or coronary anatomy or pathology. The coronary calcium score/coronary CTA interpretation by the cardiologist is attached. COMPARISON:  Chest CT 01/30/2018. FINDINGS: Extracardiac findings will be described separately under dictation for contemporaneously obtained CTA chest, abdomen and pelvis. IMPRESSION: Please see separate dictation for contemporaneously  obtained CTA chest, abdomen and pelvis dated 03/11/2019 for full description of relevant extracardiac findings. Electronically Signed: By: Trudie Reed M.D. On: 03/11/2019 11:30   Ct Angio Chest Aorta W/cm &/or Wo/cm  Result Date: 03/11/2019 CLINICAL DATA:  85 year old male with history of severe aortic stenosis. Preprocedural study prior to potential transcatheter aortic valve replacement (TAVR) procedure. EXAM: CT ANGIOGRAPHY CHEST, ABDOMEN AND PELVIS TECHNIQUE: Multidetector CT imaging  through the chest, abdomen and pelvis was performed using the standard protocol during bolus administration of intravenous contrast. Multiplanar reconstructed images and MIPs were obtained and reviewed to evaluate the vascular anatomy. CONTRAST:  OMNIPAQUE IOHEXOL 350 MG/ML SOLN COMPARISON:  Chest CT 01/30/2018. CT the abdomen and pelvis 12/03/2016. FINDINGS: CTA CHEST FINDINGS Cardiovascular: Heart size is normal. Small amount of pericardial fluid and/or thickening, unlikely to be of any hemodynamic significance at this time. No associated pericardial calcification. There is aortic atherosclerosis, as well as atherosclerosis of the great vessels of the mediastinum and the coronary arteries, including calcified atherosclerotic plaque in the left main and left anterior descending coronary arteries. Ectasia of ascending thoracic aorta (4.0 cm in diameter). Severe thickening calcification of the aortic valve. Mediastinum/Lymph Nodes: No pathologically enlarged mediastinal or hilar lymph nodes. Esophagus is unremarkable in appearance. No axillary lymphadenopathy. Lungs/Pleura: Several small pulmonary nodules are noted in the right lung, largest of which is in the periphery of the right lower lobe (axial image 61 of series 17) measuring 10 x 8 mm (mean diameter of 9 mm). No acute consolidative airspace disease. No pleural effusions. Musculoskeletal/Soft Tissues: Chronic compression fracture of T8 with 90% loss of anterior  vertebral body height. New compression fracture of T12 with 40% loss of anterior vertebral body height, which does not appear acute. There are no aggressive appearing lytic or blastic lesions noted in the visualized portions of the skeleton. CTA ABDOMEN AND PELVIS FINDINGS Hepatobiliary: No suspicious cystic or solid hepatic lesions. No intra or extrahepatic biliary ductal dilatation. Gallbladder is normal in appearance. Pancreas: No pancreatic mass. No pancreatic ductal dilatation. No pancreatic or peripancreatic fluid or inflammatory changes. Spleen: Unremarkable. Adrenals/Urinary Tract: Mild diffuse cortical atrophy in the left kidney. Right kidney and adrenal glands are normal in appearance. No hydroureteronephrosis. Urinary bladder is normal in appearance. Stomach/Bowel: Normal appearance of the stomach. No pathologic dilatation of small bowel or colon. Numerous colonic diverticulae are noted, without surrounding inflammatory changes to suggest an acute diverticulitis at this time. The appendix is not confidently identified and may be surgically absent. Regardless, there are no inflammatory changes noted adjacent to the cecum to suggest the presence of an acute appendicitis at this time. Vascular/Lymphatic: Aortic atherosclerosis with fusiform aneurysmal dilatation of the infrarenal abdominal aorta which measures up to 3.7 x 4.0 cm. Vascular findings and measurements pertinent to potential TAVR procedure, as detailed below. No lymphadenopathy noted in the abdomen or pelvis. Reproductive: Prostate gland and seminal vesicles are unremarkable in appearance. Other: No significant volume of ascites.  No pneumoperitoneum. Musculoskeletal: Chronic compression fracture of L4 with 30% loss of anterior vertebral body height. There are no aggressive appearing lytic or blastic lesions noted in the visualized portions of the skeleton. VASCULAR MEASUREMENTS PERTINENT TO TAVR: AORTA: Minimal Aortic Diameter-20 x 14 mm  Severity of Aortic Calcification-severe RIGHT PELVIS: Right Common Iliac Artery - Minimal Diameter-10.3 x 8.3 mm Tortuosity-moderate to severe Calcification-moderate Right External Iliac Artery - Minimal Diameter-8.3 x 7.2 mm Tortuosity-mild-to-moderate Calcification-mild Right Common Femoral Artery - Minimal Diameter-8.3 x 8.3 mm Tortuosity-mild Calcification-mild LEFT PELVIS: Left Common Iliac Artery - Minimal Diameter-8.4 x 8.6 mm Tortuosity-mild Calcification-moderate Left External Iliac Artery - Minimal Diameter-7.7 x 7.9 mm Tortuosity-mild Calcification-mild Left Common Femoral Artery - Minimal Diameter-8.2 x 8.0 mm Tortuosity-mild Calcification-moderate Review of the MIP images confirms the above findings. IMPRESSION: 1. Vascular findings and measurements pertinent to potential TAVR procedure, as detailed above. 2. Severe thickening calcification of the aortic valve, compatible with the reported  clinical history of aortic stenosis. 3. Aortic atherosclerosis, in addition to left main and left anterior descending coronary artery disease. There is also ectasia of the ascending thoracic aorta (4.0 cm in diameter), as well as fusiform aneurysmal dilatation of the infrarenal abdominal aorta (3.7 x 4.0 cm). Recommend followup by ultrasound in 2 years. This recommendation follows ACR consensus guidelines: White Paper of the ACR Incidental Findings Committee II on Vascular Findings. J Am Coll Radiol 2013; 10:789-794. 4. Multiple small pulmonary nodules in the right lung, largest of which measures up to a mean diameter of 9 mm in the right lower lobe. Non-contrast chest CT at 3-6 months is recommended. If the nodules are stable at time of repeat CT, then future CT at 18-24 months (from today's scan) is considered optional for low-risk patients, but is recommended for high-risk patients. This recommendation follows the consensus statement: Guidelines for Management of Incidental Pulmonary Nodules Detected on CT Images:  From the Fleischner Society 2017; Radiology 2017; 284:228-243. 5. Colonic diverticulosis without evidence of acute diverticulitis at this time. 6. Additional incidental findings, as above. Electronically Signed   By: Trudie Reedaniel  Entrikin M.D.   On: 03/11/2019 12:27   [place] Carotid  Result Date: 03/11/2019 Carotid Arterial Duplex Study Indications:       Pre-TAVR. Other Factors:     Aortic stenosis. Limitations:       Patient body habitus, patient anatomy, patient movement Comparison Study:  No prior studies. Performing Technologist: Chanda BusingGregory Collins RVT  Examination Guidelines: A complete evaluation includes B-mode imaging, spectral Doppler, color Doppler, and power Doppler as needed of all accessible portions of each vessel. Bilateral testing is considered an integral part of a complete examination. Limited examinations for reoccurring indications may be performed as noted.  Right Carotid Findings: +----------+--------+--------+--------+-----------------------+--------+             PSV cm/s EDV cm/s Stenosis Describe                Comments  +----------+--------+--------+--------+-----------------------+--------+  CCA Prox   72       10                                        tortuous  +----------+--------+--------+--------+-----------------------+--------+  CCA Distal 71       12                smooth and heterogenous           +----------+--------+--------+--------+-----------------------+--------+  ICA Prox   60       15                smooth and heterogenous           +----------+--------+--------+--------+-----------------------+--------+  ICA Distal 77       17                                                  +----------+--------+--------+--------+-----------------------+--------+  ECA        56       0                                                   +----------+--------+--------+--------+-----------------------+--------+ +----------+--------+-------+--------+-------------------+  PSV cm/s EDV  cms Describe Arm Pressure (mmHG)  +----------+--------+-------+--------+-------------------+  Subclavian 88                                             +----------+--------+-------+--------+-------------------+ +---------+--------+--+--------+-+---------+  Vertebral PSV cm/s 30 EDV cm/s 6 Antegrade  +---------+--------+--+--------+-+---------+  Left Carotid Findings: +----------+--------+-------+--------+--------------------------------+--------+             PSV cm/s EDV     Stenosis Describe                         Comments                       cm/s                                                        +----------+--------+-------+--------+--------------------------------+--------+  CCA Prox   67       12               smooth and heterogenous          tortuous  +----------+--------+-------+--------+--------------------------------+--------+  CCA Distal 77       19               smooth and heterogenous                    +----------+--------+-------+--------+--------------------------------+--------+  ICA Prox   64       16               smooth, heterogenous and                                                         calcific                                   +----------+--------+-------+--------+--------------------------------+--------+  ICA Distal 69       20                                                          +----------+--------+-------+--------+--------------------------------+--------+  ECA        85       9                                                           +----------+--------+-------+--------+--------------------------------+--------+ +----------+--------+--------+--------+-------------------+  Subclavian PSV cm/s EDV cm/s Describe Arm Pressure (mmHG)  +----------+--------+--------+--------+-------------------+             91                                              +----------+--------+--------+--------+-------------------+ +---------+--------+--+--------+-+---------+  Vertebral PSV  cm/s 39 EDV cm/s 8 Antegrade  +---------+--------+--+--------+-+---------+  Summary: Right Carotid: Velocities in the right ICA are consistent with a 1-39% stenosis. Left Carotid: Velocities in the left ICA are consistent with a 1-39% stenosis. Vertebrals: Bilateral vertebral arteries demonstrate antegrade flow. *See table(s) above for measurements and observations.  Electronically signed by Waverly Ferrarihristopher Dickson MD on 03/11/2019 at 6:29:41 AM.    Final    Ct Angio Abd/pel W/ And/or W/o  Result Date: 03/11/2019 CLINICAL DATA:  85 year old male with history of severe aortic stenosis. Preprocedural study prior to potential transcatheter aortic valve replacement (TAVR) procedure. EXAM: CT ANGIOGRAPHY CHEST, ABDOMEN AND PELVIS TECHNIQUE: Multidetector CT imaging through the chest, abdomen and pelvis was performed using the standard protocol during bolus administration of intravenous contrast. Multiplanar reconstructed images and MIPs were obtained and reviewed to evaluate the vascular anatomy. CONTRAST:  100mL OMNIPAQUE IOHEXOL 350 MG/ML SOLN COMPARISON:  Chest CT 01/30/2018. CT the abdomen and pelvis 12/03/2016. FINDINGS: CTA CHEST FINDINGS Cardiovascular: Heart size is normal. Small amount of pericardial fluid and/or thickening, unlikely to be of any hemodynamic significance at this time. No associated pericardial calcification. There is aortic atherosclerosis, as well as atherosclerosis of the great vessels of the mediastinum and the coronary arteries, including calcified atherosclerotic plaque in the left main and left anterior descending coronary arteries. Ectasia of ascending thoracic aorta (4.0 cm in diameter). Severe thickening calcification of the aortic valve. Mediastinum/Lymph Nodes: No pathologically enlarged mediastinal or hilar lymph nodes. Esophagus is unremarkable in appearance. No axillary lymphadenopathy. Lungs/Pleura: Several small pulmonary nodules are noted in the right lung, largest of which is in  the periphery of the right lower lobe (axial image 61 of series 17) measuring 10 x 8 mm (mean diameter of 9 mm). No acute consolidative airspace disease. No pleural effusions. Musculoskeletal/Soft Tissues: Chronic compression fracture of T8 with 90% loss of anterior vertebral body height. New compression fracture of T12 with 40% loss of anterior vertebral body height, which does not appear acute. There are no aggressive appearing lytic or blastic lesions noted in the visualized portions of the skeleton. CTA ABDOMEN AND PELVIS FINDINGS Hepatobiliary: No suspicious cystic or solid hepatic lesions. No intra or extrahepatic biliary ductal dilatation. Gallbladder is normal in appearance. Pancreas: No pancreatic mass. No pancreatic ductal dilatation. No pancreatic or peripancreatic fluid or inflammatory changes. Spleen: Unremarkable. Adrenals/Urinary Tract: Mild diffuse cortical atrophy in the left kidney. Right kidney and adrenal glands are normal in appearance. No hydroureteronephrosis. Urinary bladder is normal in appearance. Stomach/Bowel: Normal appearance of the stomach. No pathologic dilatation of small bowel or colon. Numerous colonic diverticulae are noted, without surrounding inflammatory changes to suggest an acute diverticulitis at this time. The appendix is not confidently identified and may be surgically absent. Regardless, there are no inflammatory changes noted adjacent to the cecum to suggest the presence of an acute appendicitis at this time. Vascular/Lymphatic: Aortic atherosclerosis with fusiform aneurysmal dilatation of the infrarenal abdominal aorta which measures up to 3.7 x 4.0 cm. Vascular findings and measurements pertinent to potential TAVR procedure, as detailed below. No lymphadenopathy noted in the abdomen or pelvis. Reproductive: Prostate gland and seminal vesicles are unremarkable in appearance. Other: No significant volume of ascites.  No pneumoperitoneum. Musculoskeletal: Chronic  compression fracture of L4 with 30% loss of anterior vertebral body height. There are no aggressive appearing lytic or blastic lesions noted in the visualized portions of the skeleton. VASCULAR MEASUREMENTS PERTINENT TO TAVR: AORTA: Minimal Aortic Diameter-20 x 14 mm Severity of  Aortic Calcification-severe RIGHT PELVIS: Right Common Iliac Artery - Minimal Diameter-10.3 x 8.3 mm Tortuosity-moderate to severe Calcification-moderate Right External Iliac Artery - Minimal Diameter-8.3 x 7.2 mm Tortuosity-mild-to-moderate Calcification-mild Right Common Femoral Artery - Minimal Diameter-8.3 x 8.3 mm Tortuosity-mild Calcification-mild LEFT PELVIS: Left Common Iliac Artery - Minimal Diameter-8.4 x 8.6 mm Tortuosity-mild Calcification-moderate Left External Iliac Artery - Minimal Diameter-7.7 x 7.9 mm Tortuosity-mild Calcification-mild Left Common Femoral Artery - Minimal Diameter-8.2 x 8.0 mm Tortuosity-mild Calcification-moderate Review of the MIP images confirms the above findings. IMPRESSION: 1. Vascular findings and measurements pertinent to potential TAVR procedure, as detailed above. 2. Severe thickening calcification of the aortic valve, compatible with the reported clinical history of aortic stenosis. 3. Aortic atherosclerosis, in addition to left main and left anterior descending coronary artery disease. There is also ectasia of the ascending thoracic aorta (4.0 cm in diameter), as well as fusiform aneurysmal dilatation of the infrarenal abdominal aorta (3.7 x 4.0 cm). Recommend followup by ultrasound in 2 years. This recommendation follows ACR consensus guidelines: White Paper of the ACR Incidental Findings Committee II on Vascular Findings. J Am Coll Radiol 2013; 10:789-794. 4. Multiple small pulmonary nodules in the right lung, largest of which measures up to a mean diameter of 9 mm in the right lower lobe. Non-contrast chest CT at 3-6 months is recommended. If the nodules are stable at time of repeat CT, then  future CT at 18-24 months (from today's scan) is considered optional for low-risk patients, but is recommended for high-risk patients. This recommendation follows the consensus statement: Guidelines for Management of Incidental Pulmonary Nodules Detected on CT Images: From the Fleischner Society 2017; Radiology 2017; 284:228-243. 5. Colonic diverticulosis without evidence of acute diverticulitis at this time. 6. Additional incidental findings, as above. Electronically Signed   By: Vinnie Langton M.D.   On: 03/11/2019 12:27    Cardiac Studies   Cath showed nonobstructive CAD, severe AS  Patient Profile     85 y.o. male severe AS  Assessment & Plan    1) Plan for TAVR tomorrow for severe AS. 2) BP controlled. Continue current meds. 3) CRI: Cr stable.  For questions or updates, please contact [place] Please consult [email or web address] for contact info under        Signed, Larae Grooms, MD  03/12/2019, 10:23 AM

## 2019-03-12 NOTE — Progress Notes (Signed)
Physical Therapy Treatment Patient Details Name: LISANDRO MEGGETT MRN: [id number] DOB: 1932/05/19 Today's Date: 03/12/2019    History of Present Illness 85 y/o m with a h/o progressive and now severe aortic stenosis, moderate MR, HTN, CKD III, and coronary calcium, tranferred from [facility] for admission following syncope.Admitted for evaluation of AS. s/p cardiac cath 6/23. Currently being worked up for TAVR    PT Comments    Pt in bed on arrival, talkative and at times tangential discussing living in [place]. Pt with greatly improved activity tolerance today and able to walk long hall way with RW and assist. Pt reports no significant DOE or fatigue and reports even though he had struggles at home he managed. Pt encouraged to be OOB for meals and educated for HEP and walking program. Pt demonstrates good bil LE strength but decreased core/trunk strength. Will continue to follow.   6 Minute Walk Test  Distance - 384 ft      Pre    Post  BP     145/76   150/83  HR    70              76  SaO2    97    98  Modified Borg Dyspnea 0    1  RPE    6    9    5  Meter Walk Test  Trial   1) 13.6 sec   2) 14.4 sec  3) 13.2 sec  Average - 13.7 sec = 1.19 ft/sec   Clinical Frailty Scale - 4 vulnerable             Follow Up Recommendations  Supervision/Assistance - 24 hour;Home health PT     Equipment Recommendations  Rolling walker with 5" wheels    Recommendations for Other Services       Precautions / Restrictions Precautions Precautions: Fall Precaution Comments: hx of syncope with fall Restrictions Weight Bearing Restrictions: No    Mobility  Bed Mobility Overal bed mobility: Needs Assistance Bed Mobility: Supine to Sit     Supine to sit: Min guard     General bed mobility comments: HOB 15 degrees with increased time and struggle to transition to EOB  Transfers Overall transfer level: Needs assistance Equipment used: Rolling walker (2  wheeled) Transfers: Sit to/from Stand Sit to Stand: Min guard         General transfer comment: cues for hand placement with momentum and tactile cue to rise from surface  Ambulation/Gait Ambulation/Gait assistance: Min assist Gait Distance (Feet): 384 Feet Assistive device: Rolling walker (2 wheeled) Gait Pattern/deviations: Step-through pattern;Narrow base of support;Trunk flexed;Decreased stride length   Gait velocity interpretation: <1.8 ft/sec, indicate of risk for recurrent falls General Gait Details: min assist for directiong RW at times with cues for direction, proximity to RW and posture. Pt maintains flexed trunk with decreased safety awareness   Stairs             Wheelchair Mobility    Modified Rankin (Stroke Patients Only)       Balance Overall balance assessment: Needs assistance   Sitting balance-Leahy Scale: Good Sitting balance - Comments: able to sit EOB without UE assist   Standing balance support: Bilateral upper extremity supported Standing balance-Leahy Scale: Poor Standing balance comment: B UE support for dynamic standing activities                            Cognition Arousal/Alertness:  Awake/alert Behavior During Therapy: WFL for tasks assessed/performed Overall Cognitive Status: Impaired/Different from baseline Area of Impairment: Orientation;Memory;Problem solving;Safety/judgement;Following commands                 Orientation Level: Time;Situation   Memory: Decreased short-term memory Following Commands: Follows one step commands with increased time Safety/Judgement: Decreased awareness of deficits;Decreased awareness of safety Awareness: Intellectual Problem Solving: Slow processing;Requires verbal cues;Requires tactile cues        Exercises General Exercises - Lower Extremity Long Arc Quad: AROM;20 reps;Both;Seated Hip ABduction/ADduction: AROM;20 reps;Both;Seated Hip Flexion/Marching: AROM;20  reps;Both;Seated    General Comments        Pertinent Vitals/Pain Pain Assessment: No/denies pain    Home Living Family/patient expects to be discharged to:: Private residence Living Arrangements: Children Available Help at Discharge: Family;Available 24 hours/day Type of Home: House Home Access: Stairs to enter   Home Layout: Two level        Prior Function Level of Independence: Needs assistance  Gait / Transfers Assistance Needed: ambulates in home without AD ADL's / Homemaking Assistance Needed: stands to shower, reports daughters help when he needs it with ADL and manage all meal prep and housekeeping     PT Goals (current goals can now be found in the care plan section) Acute Rehab PT Goals Patient Stated Goal: go home with daughters Progress towards PT goals: Progressing toward goals    Frequency           PT Plan Current plan remains appropriate    Co-evaluation              AM-PAC PT "6 Clicks" Mobility   Outcome Measure  Help needed turning from your back to your side while in a flat bed without using bedrails?: None Help needed moving from lying on your back to sitting on the side of a flat bed without using bedrails?: A Little Help needed moving to and from a bed to a chair (including a wheelchair)?: A Little Help needed standing up from a chair using your arms (e.g., wheelchair or bedside chair)?: A Little Help needed to walk in hospital room?: A Little Help needed climbing 3-5 steps with a railing? : A Little 6 Click Score: 19    End of Session Equipment Utilized During Treatment: Gait belt Activity Tolerance: Patient tolerated treatment well Patient left: in chair;with call bell/phone within reach;with chair alarm set Nurse Communication: Mobility status PT Visit Diagnosis: Unsteadiness on feet (R26.81);Other abnormalities of gait and mobility (R26.89);Muscle weakness (generalized) (M62.81);History of falling (Z91.81);Difficulty in  walking, not elsewhere classified (R26.2)     Time: [id number] PT Time Calculation (min) (ACUTE ONLY): 28 min  Charges:  $Gait Training: 8-22 mins $Therapeutic Activity: 8-22 mins                     Jamori Biggar Abner Greenspanabor Keyle Doby, PT Acute Rehabilitation Services Pager: [phone] Office: [phone]    Enedina FinnerMaija B Jeffory Snelgrove 03/12/2019, 12:25 PM

## 2019-03-12 NOTE — Plan of Care (Signed)

## 2019-03-12 NOTE — Evaluation (Signed)
Occupational Therapy Evaluation Patient Details Name: Dan Perez MRN: [id number] DOB: 1932-07-13 Today's Date: 03/12/2019    History of Present Illness 85 y/o m with a h/o progressive and now severe aortic stenosis, moderate MR, HTN, CKD III, and coronary calcium, who presents on tx from [facility] for admission following syncope.Admitted for evaluation of AS. s/p cardiac cath 6/23. Currently being worked up for TAVR   Clinical Impression   Pt reports walking without AD and performing self care independently including standing to shower. He relies on his daughters for IADL. Pt presents with impaired cognition, generalized weakness and impaired standing balance. He requires up to min assist for self care and mobility. Will follow acutely. Recommending home with his supportive family and HHOT.     Follow Up Recommendations  Home health OT;Supervision/Assistance - 24 hour    Equipment Recommendations  3 in 1 bedside commode    Recommendations for Other Services       Precautions / Restrictions Precautions Precautions: Fall Precaution Comments: hx of syncope with fall Restrictions Weight Bearing Restrictions: No      Mobility Bed Mobility               General bed mobility comments: pt received in chair  Transfers Overall transfer level: Needs assistance Equipment used: Rolling walker (2 wheeled) Transfers: Sit to/from Stand Sit to Stand: Min guard         General transfer comment: min guard for safety    Balance Overall balance assessment: Needs assistance   Sitting balance-Leahy Scale: Good Sitting balance - Comments: no LOB with donning socks   Standing balance support: Bilateral upper extremity supported Standing balance-Leahy Scale: Poor Standing balance comment: B UE support for dynamic standing activities                           ADL either performed or assessed with clinical judgement   ADL Overall ADL's : Needs  assistance/impaired Eating/Feeding: Independent;Sitting   Grooming: Wash/dry hands;Standing;Min guard   Upper Body Bathing: Set up;Sitting   Lower Body Bathing: Minimal assistance;Sit to/from stand   Upper Body Dressing : Set up;Sitting   Lower Body Dressing: Minimal assistance;Sit to/from stand Lower Body Dressing Details (indicate cue type and reason): steadying assist in standing, pt able to don and doff socks with ease Toilet Transfer: Minimal assistance;Ambulation;RW   Toileting- Clothing Manipulation and Hygiene: Minimal assistance;Sit to/from stand       Functional mobility during ADLs: Rolling walker;Minimal assistance       Vision Baseline Vision/History: Wears glasses Wears Glasses: At all times Additional Comments: difficulty reading small print     Perception     Praxis      Pertinent Vitals/Pain Pain Assessment: No/denies pain     Hand Dominance Right   Extremity/Trunk Assessment Upper Extremity Assessment Upper Extremity Assessment: Overall WFL for tasks assessed   Lower Extremity Assessment Lower Extremity Assessment: Defer to PT evaluation   Cervical / Trunk Assessment Cervical / Trunk Assessment: Kyphotic   Communication Communication Communication: HOH   Cognition Arousal/Alertness: Awake/alert Behavior During Therapy: WFL for tasks assessed/performed Overall Cognitive Status: Impaired/Different from baseline Area of Impairment: Orientation;Memory;Problem solving;Safety/judgement;Following commands                 Orientation Level: Time;Situation   Memory: Decreased short-term memory Following Commands: Follows one step commands with increased time Safety/Judgement: Decreased awareness of deficits;Decreased awareness of safety Awareness: Intellectual Problem Solving: Slow processing;Requires verbal cues;Requires tactile  cues     General Comments       Exercises     Shoulder Instructions      Home Living Family/patient  expects to be discharged to:: Private residence Living Arrangements: Children Available Help at Discharge: Family;Available 24 hours/day Type of Home: House Home Access: Stairs to enter Enter[employer] CorporationEntrance Stairs-Number of Steps: 4   Home Layout: Two level     Bathroom Shower/Tub: Chief Strategy OfficerTub/shower unit   Bathroom Toilet: Standard                Prior Functioning/Environment Level of Independence: Needs assistance  Gait / Transfers Assistance Needed: ambulates in home without AD ADL's / Homemaking Assistance Needed: stands to shower, reports daughters help when he needs it with ADL and manage all meal prep and housekeeping            OT Problem List: Decreased activity tolerance;Impaired balance (sitting and/or standing);Decreased cognition;Decreased safety awareness;Decreased knowledge of use of DME or AE      OT Treatment/Interventions: Self-care/ADL training;DME and/or AE instruction;Patient/family education;Balance training;Cognitive remediation/compensation;Therapeutic activities    OT Goals(Current goals can be found in the care plan section) Acute Rehab OT Goals Patient Stated Goal: go home with daughters OT Goal Formulation: With patient Time For Goal Achievement: 03/26/19 Potential to Achieve Goals: Good ADL Goals Pt Will Perform Grooming: with supervision;standing Pt Will Perform Lower Body Bathing: with supervision;sit to/from stand Pt Will Perform Lower Body Dressing: with supervision;sit to/from stand Pt Will Transfer to Toilet: with supervision;ambulating;bedside commode(over toilet) Pt Will Perform Toileting - Clothing Manipulation and hygiene: with supervision;sit to/from stand Pt Will Perform Tub/Shower Transfer: with supervision;ambulating;3 in 1;rolling walker;Tub transfer  OT Frequency: Min 2X/week   Barriers to D/C:            Co-evaluation              AM-PAC OT "6 Clicks" Daily Activity     Outcome Measure Help from another person eating meals?:  None Help from another person taking care of personal grooming?: A Little Help from another person toileting, which includes using toliet, bedpan, or urinal?: A Little Help from another person bathing (including washing, rinsing, drying)?: A Little Help from another person to put on and taking off regular upper body clothing?: None Help from another person to put on and taking off regular lower body clothing?: A Little 6 Click Score: 20   End of Session Equipment Utilized During Treatment: Gait belt;Rolling walker  Activity Tolerance: Patient tolerated treatment well Patient left: in chair;with call bell/phone within reach;with chair alarm set  OT Visit Diagnosis: Unsteadiness on feet (R26.81);Other abnormalities of gait and mobility (R26.89);Other symptoms and signs involving cognitive function;Muscle weakness (generalized) (M62.81)                Time: [id number] OT Time Calculation (min): 19 min Charges:  OT General Charges $OT Visit: 1 Visit OT Evaluation $OT Eval Moderate Complexity: 1 Mod  Dan RoundJulie Emmaleigh Perez, OTR/L Acute Rehabilitation Services Pager: [phone] Office: [phone]  Dan Perez, Dan Perez 03/12/2019, 10:46 AM

## 2019-03-13 NOTE — Progress Notes (Signed)
Progress Note  Patient Name: Dan BrimLawrence E [place]ampole Date of Encounter: 03/13/2019  Primary Cardiologist: Prentice DockerSuresh Koneswaran, MD   Subjective   Feels well today.  Inpatient Medications    Scheduled Meds:  amLODipine  5 mg Oral Daily   memantine  28 mg Oral QHS   And   donepezil  10 mg Oral QHS   enoxaparin (LOVENOX) injection  40 mg Subcutaneous Q24H   feeding supplement (ENSURE ENLIVE)  237 mL Oral BID BM   multivitamin with minerals  1 tablet Oral Daily   polyethylene glycol  17 g Oral Daily   sertraline  50 mg Oral Daily   sodium chloride flush  3 mL Intravenous Q12H   sodium chloride flush  3 mL Intravenous Q12H   sodium chloride flush  3 mL Intravenous Q12H   Continuous Infusions:  sodium chloride     sodium chloride     PRN Meds: sodium chloride, sodium chloride, acetaminophen, ondansetron (ZOFRAN) IV, sodium chloride flush, sodium chloride flush   Vital Signs    Vitals:   03/12/19 1229 03/12/19 2011 03/13/19 0311 03/13/19 0549  BP: (!) 151/96 (!) 141/88  (!) 179/85  Pulse: 61 65  (!) 55  Resp: 18 16  16   Temp: 98.3 F (36.8 C) 98.3 F (36.8 C)  (!) 97.4 F (36.3 C)  TempSrc: Oral Oral  Oral  SpO2: 99% 96%  96%  Weight:   62.6 kg   Height:        Intake/Output Summary (Last 24 hours) at 03/13/2019 1002 Last data filed at 03/13/2019 0747 Gross per 24 hour  Intake 240 ml  Output 1451 ml  Net -1211 ml   Last 3 Weights 03/13/2019 03/12/2019 03/11/2019  Weight (lbs) 137 lb 14.4 oz 182 lb 1.6 oz 139 lb 4.8 oz  Weight (kg) 62.551 kg 82.6 kg 63.186 kg      Telemetry    Normal sinus rhythm- Personally Reviewed  ECG      Physical Exam   GEN: No acute distress.   Neck: No JVD Cardiac: RRR,3/6 systolic murmurs, rubs, or gallops.  Respiratory: Clear to auscultation bilaterally. GI: Soft, nontender, non-distended  MS: No edema; No deformity.  Left face bruised Neuro:  Nonfocal  Psych: Normal affect   Labs    High Sensitivity Troponin:   No results for input(s): TROPONINIHS in the last 720 hours.    Cardiac Enzymes Recent Labs  Lab 03/06/19 2042  TROPONINI 0.03*   No results for input(s): TROPIPOC in the last 168 hours.   Chemistry Recent Labs  Lab 03/09/19 [phone] 03/10/19 0433 03/10/19 1045 03/10/19 1051 03/11/19 0639  NA 133* 133* 136 139 136  K 5.2* 5.0 4.4 4.1 5.1  CL 98 97*  --   --  100  CO2 29 29  --   --  29  GLUCOSE 94 91  --   --  91  BUN 20 22  --   --  22  CREATININE 1.32* 1.27*  --   --  1.16  CALCIUM 8.7* 8.6*  --   --  8.8*  GFRNONAA 49* 52*  --   --  58*  GFRAA 57* 60*  --   --  >60  ANIONGAP 6 7  --   --  7     Hematology Recent Labs  Lab 03/07/19 0427 03/10/19 1045 03/10/19 1051 03/11/19 0639  WBC 6.5  --   --  6.7  RBC 3.75*  --   --  3.75*  HGB 10.7* 11.9* 10.9* 11.0*  HCT 34.1* 35.0* 32.0* 33.9*  MCV 90.9  --   --  90.4  MCH 28.5  --   --  29.3  MCHC 31.4  --   --  32.4  RDW 13.6  --   --  13.6  PLT 178  --   --  174    BNPNo results for input(s): BNP, PROBNP in the last 168 hours.   DDimer No results for input(s): DDIMER in the last 168 hours.   Radiology    Ct Coronary Morph W/cta Cor W/score W/ca W/cm &/or Wo/cm  Addendum Date: 03/11/2019   ADDENDUM REPORT: 03/11/2019 12:48 CLINICAL DATA:  85 year old male with severe aortic stenosis being evaluated for a TAVR procedure. EXAM: Cardiac TAVR CT TECHNIQUE: The patient was scanned on a [employer]. A 120 kV retrospective scan was triggered in the descending thoracic aorta at 111 HU's. Gantry rotation speed was 250 msecs and collimation was .6 mm. No beta blockade or nitro were given. The 3D data set was reconstructed in 5% intervals of the R-R cycle. Systolic and diastolic phases were analyzed on a dedicated work station using MPR, MIP and VRT modes. The patient received 80 cc of contrast. FINDINGS: Aortic Valve: Trileaflet aortic valve with severely thickened and calcified leaflets and severely restricted leaflets  opening and mild asymmetric calcifications extending into the LVOT under the left coronary sinus. Aorta: Normal size with moderate diffuse atherosclerotic plaque and calcifications. No dissection. Tortuous course of the abdominal aorta. Sinotubular Junction: 33 x 33 mm Ascending Thoracic Aorta: 39 x 38 mm Aortic Arch: 33 x 29 mm Descending Thoracic Aorta: 29 x 27 mm Sinus of Valsalva Measurements: Non-coronary: 37 mm Right -coronary: 36 mm Left -coronary: 36 mm Coronary Artery Height above Annulus: Left Main: 19 mm Right Coronary: 17 mm Virtual Basal Annulus Measurements: Maximum/Minimum Diameter: 30.6 x 25.2 mm Mean Diameter: 26.7 mm Perimeter: 86 mm Area: 558 mm2 Optimum Fluoroscopic Angle for Delivery: RAO 4 CAU 43 IMPRESSION: 1. Trileaflet aortic valve with severely thickened and calcified leaflets and severely restricted leaflets opening and mild asymmetric calcifications extending into the LVOT under the left coronary sinus. Annular measurements suitable for delivery of a 29 mm Edwards-SAPIEN 3 valve. Aortic valve calcium score 5172 consistent with severe aortic stenosis. 2. Sufficient coronary to annulus distance. 3. Optimum Fluoroscopic Angle for Delivery:  RAO 4 CAU 43. 4. No thrombus in the left atrial appendage. Electronically Signed   By: Tobias Alexander   On: 03/11/2019 12:48   Result Date: 03/11/2019 EXAM: OVER-READ INTERPRETATION  CT CHEST The following report is an over-read performed by radiologist Dr. Trudie Reed of [facility] on 03/11/2019. This over-read does not include interpretation of cardiac or coronary anatomy or pathology. The coronary calcium score/coronary CTA interpretation by the cardiologist is attached. COMPARISON:  Chest CT 01/30/2018. FINDINGS: Extracardiac findings will be described separately under dictation for contemporaneously obtained CTA chest, abdomen and pelvis. IMPRESSION: Please see separate dictation for contemporaneously obtained CTA chest, abdomen  and pelvis dated 03/11/2019 for full description of relevant extracardiac findings. Electronically Signed: By: Trudie Reed M.D. On: 03/11/2019 11:30   Ct Angio Chest Aorta W/cm &/or Wo/cm  Result Date: 03/11/2019 CLINICAL DATA:  85 year old male with history of severe aortic stenosis. Preprocedural study prior to potential transcatheter aortic valve replacement (TAVR) procedure. EXAM: CT ANGIOGRAPHY CHEST, ABDOMEN AND PELVIS TECHNIQUE: Multidetector CT imaging through the chest, abdomen and pelvis was performed using the  standard protocol during bolus administration of intravenous contrast. Multiplanar reconstructed images and MIPs were obtained and reviewed to evaluate the vascular anatomy. CONTRAST:  100mL OMNIPAQUE IOHEXOL 350 MG/ML SOLN COMPARISON:  Chest CT 01/30/2018. CT the abdomen and pelvis 12/03/2016. FINDINGS: CTA CHEST FINDINGS Cardiovascular: Heart size is normal. Small amount of pericardial fluid and/or thickening, unlikely to be of any hemodynamic significance at this time. No associated pericardial calcification. There is aortic atherosclerosis, as well as atherosclerosis of the great vessels of the mediastinum and the coronary arteries, including calcified atherosclerotic plaque in the left main and left anterior descending coronary arteries. Ectasia of ascending thoracic aorta (4.0 cm in diameter). Severe thickening calcification of the aortic valve. Mediastinum/Lymph Nodes: No pathologically enlarged mediastinal or hilar lymph nodes. Esophagus is unremarkable in appearance. No axillary lymphadenopathy. Lungs/Pleura: Several small pulmonary nodules are noted in the right lung, largest of which is in the periphery of the right lower lobe (axial image 61 of series 17) measuring 10 x 8 mm (mean diameter of 9 mm). No acute consolidative airspace disease. No pleural effusions. Musculoskeletal/Soft Tissues: Chronic compression fracture of T8 with 90% loss of anterior vertebral body height. New  compression fracture of T12 with 40% loss of anterior vertebral body height, which does not appear acute. There are no aggressive appearing lytic or blastic lesions noted in the visualized portions of the skeleton. CTA ABDOMEN AND PELVIS FINDINGS Hepatobiliary: No suspicious cystic or solid hepatic lesions. No intra or extrahepatic biliary ductal dilatation. Gallbladder is normal in appearance. Pancreas: No pancreatic mass. No pancreatic ductal dilatation. No pancreatic or peripancreatic fluid or inflammatory changes. Spleen: Unremarkable. Adrenals/Urinary Tract: Mild diffuse cortical atrophy in the left kidney. Right kidney and adrenal glands are normal in appearance. No hydroureteronephrosis. Urinary bladder is normal in appearance. Stomach/Bowel: Normal appearance of the stomach. No pathologic dilatation of small bowel or colon. Numerous colonic diverticulae are noted, without surrounding inflammatory changes to suggest an acute diverticulitis at this time. The appendix is not confidently identified and may be surgically absent. Regardless, there are no inflammatory changes noted adjacent to the cecum to suggest the presence of an acute appendicitis at this time. Vascular/Lymphatic: Aortic atherosclerosis with fusiform aneurysmal dilatation of the infrarenal abdominal aorta which measures up to 3.7 x 4.0 cm. Vascular findings and measurements pertinent to potential TAVR procedure, as detailed below. No lymphadenopathy noted in the abdomen or pelvis. Reproductive: Prostate gland and seminal vesicles are unremarkable in appearance. Other: No significant volume of ascites.  No pneumoperitoneum. Musculoskeletal: Chronic compression fracture of L4 with 30% loss of anterior vertebral body height. There are no aggressive appearing lytic or blastic lesions noted in the visualized portions of the skeleton. VASCULAR MEASUREMENTS PERTINENT TO TAVR: AORTA: Minimal Aortic Diameter-20 x 14 mm Severity of Aortic  Calcification-severe RIGHT PELVIS: Right Common Iliac Artery - Minimal Diameter-10.3 x 8.3 mm Tortuosity-moderate to severe Calcification-moderate Right External Iliac Artery - Minimal Diameter-8.3 x 7.2 mm Tortuosity-mild-to-moderate Calcification-mild Right Common Femoral Artery - Minimal Diameter-8.3 x 8.3 mm Tortuosity-mild Calcification-mild LEFT PELVIS: Left Common Iliac Artery - Minimal Diameter-8.4 x 8.6 mm Tortuosity-mild Calcification-moderate Left External Iliac Artery - Minimal Diameter-7.7 x 7.9 mm Tortuosity-mild Calcification-mild Left Common Femoral Artery - Minimal Diameter-8.2 x 8.0 mm Tortuosity-mild Calcification-moderate Review of the MIP images confirms the above findings. IMPRESSION: 1. Vascular findings and measurements pertinent to potential TAVR procedure, as detailed above. 2. Severe thickening calcification of the aortic valve, compatible with the reported clinical history of aortic stenosis. 3. Aortic atherosclerosis, in addition  to left main and left anterior descending coronary artery disease. There is also ectasia of the ascending thoracic aorta (4.0 cm in diameter), as well as fusiform aneurysmal dilatation of the infrarenal abdominal aorta (3.7 x 4.0 cm). Recommend followup by ultrasound in 2 years. This recommendation follows ACR consensus guidelines: White Paper of the ACR Incidental Findings Committee II on Vascular Findings. J Am Coll Radiol 2013; 10:789-794. 4. Multiple small pulmonary nodules in the right lung, largest of which measures up to a mean diameter of 9 mm in the right lower lobe. Non-contrast chest CT at 3-6 months is recommended. If the nodules are stable at time of repeat CT, then future CT at 18-24 months (from today's scan) is considered optional for low-risk patients, but is recommended for high-risk patients. This recommendation follows the consensus statement: Guidelines for Management of Incidental Pulmonary Nodules Detected on CT Images: From the Fleischner  Society 2017; Radiology 2017; 284:228-243. 5. Colonic diverticulosis without evidence of acute diverticulitis at this time. 6. Additional incidental findings, as above. Electronically Signed   By: Vinnie Langton M.D.   On: 03/11/2019 12:27   Ct Angio Abd/pel W/ And/or W/o  Result Date: 03/11/2019 CLINICAL DATA:  85 year old male with history of severe aortic stenosis. Preprocedural study prior to potential transcatheter aortic valve replacement (TAVR) procedure. EXAM: CT ANGIOGRAPHY CHEST, ABDOMEN AND PELVIS TECHNIQUE: Multidetector CT imaging through the chest, abdomen and pelvis was performed using the standard protocol during bolus administration of intravenous contrast. Multiplanar reconstructed images and MIPs were obtained and reviewed to evaluate the vascular anatomy. CONTRAST:  120mL OMNIPAQUE IOHEXOL 350 MG/ML SOLN COMPARISON:  Chest CT 01/30/2018. CT the abdomen and pelvis 12/03/2016. FINDINGS: CTA CHEST FINDINGS Cardiovascular: Heart size is normal. Small amount of pericardial fluid and/or thickening, unlikely to be of any hemodynamic significance at this time. No associated pericardial calcification. There is aortic atherosclerosis, as well as atherosclerosis of the great vessels of the mediastinum and the coronary arteries, including calcified atherosclerotic plaque in the left main and left anterior descending coronary arteries. Ectasia of ascending thoracic aorta (4.0 cm in diameter). Severe thickening calcification of the aortic valve. Mediastinum/Lymph Nodes: No pathologically enlarged mediastinal or hilar lymph nodes. Esophagus is unremarkable in appearance. No axillary lymphadenopathy. Lungs/Pleura: Several small pulmonary nodules are noted in the right lung, largest of which is in the periphery of the right lower lobe (axial image 61 of series 17) measuring 10 x 8 mm (mean diameter of 9 mm). No acute consolidative airspace disease. No pleural effusions. Musculoskeletal/Soft Tissues: Chronic  compression fracture of T8 with 90% loss of anterior vertebral body height. New compression fracture of T12 with 40% loss of anterior vertebral body height, which does not appear acute. There are no aggressive appearing lytic or blastic lesions noted in the visualized portions of the skeleton. CTA ABDOMEN AND PELVIS FINDINGS Hepatobiliary: No suspicious cystic or solid hepatic lesions. No intra or extrahepatic biliary ductal dilatation. Gallbladder is normal in appearance. Pancreas: No pancreatic mass. No pancreatic ductal dilatation. No pancreatic or peripancreatic fluid or inflammatory changes. Spleen: Unremarkable. Adrenals/Urinary Tract: Mild diffuse cortical atrophy in the left kidney. Right kidney and adrenal glands are normal in appearance. No hydroureteronephrosis. Urinary bladder is normal in appearance. Stomach/Bowel: Normal appearance of the stomach. No pathologic dilatation of small bowel or colon. Numerous colonic diverticulae are noted, without surrounding inflammatory changes to suggest an acute diverticulitis at this time. The appendix is not confidently identified and may be surgically absent. Regardless, there are no inflammatory changes  noted adjacent to the cecum to suggest the presence of an acute appendicitis at this time. Vascular/Lymphatic: Aortic atherosclerosis with fusiform aneurysmal dilatation of the infrarenal abdominal aorta which measures up to 3.7 x 4.0 cm. Vascular findings and measurements pertinent to potential TAVR procedure, as detailed below. No lymphadenopathy noted in the abdomen or pelvis. Reproductive: Prostate gland and seminal vesicles are unremarkable in appearance. Other: No significant volume of ascites.  No pneumoperitoneum. Musculoskeletal: Chronic compression fracture of L4 with 30% loss of anterior vertebral body height. There are no aggressive appearing lytic or blastic lesions noted in the visualized portions of the skeleton. VASCULAR MEASUREMENTS PERTINENT TO  TAVR: AORTA: Minimal Aortic Diameter-20 x 14 mm Severity of Aortic Calcification-severe RIGHT PELVIS: Right Common Iliac Artery - Minimal Diameter-10.3 x 8.3 mm Tortuosity-moderate to severe Calcification-moderate Right External Iliac Artery - Minimal Diameter-8.3 x 7.2 mm Tortuosity-mild-to-moderate Calcification-mild Right Common Femoral Artery - Minimal Diameter-8.3 x 8.3 mm Tortuosity-mild Calcification-mild LEFT PELVIS: Left Common Iliac Artery - Minimal Diameter-8.4 x 8.6 mm Tortuosity-mild Calcification-moderate Left External Iliac Artery - Minimal Diameter-7.7 x 7.9 mm Tortuosity-mild Calcification-mild Left Common Femoral Artery - Minimal Diameter-8.2 x 8.0 mm Tortuosity-mild Calcification-moderate Review of the MIP images confirms the above findings. IMPRESSION: 1. Vascular findings and measurements pertinent to potential TAVR procedure, as detailed above. 2. Severe thickening calcification of the aortic valve, compatible with the reported clinical history of aortic stenosis. 3. Aortic atherosclerosis, in addition to left main and left anterior descending coronary artery disease. There is also ectasia of the ascending thoracic aorta (4.0 cm in diameter), as well as fusiform aneurysmal dilatation of the infrarenal abdominal aorta (3.7 x 4.0 cm). Recommend followup by ultrasound in 2 years. This recommendation follows ACR consensus guidelines: White Paper of the ACR Incidental Findings Committee II on Vascular Findings. J Am Coll Radiol 2013; 10:789-794. 4. Multiple small pulmonary nodules in the right lung, largest of which measures up to a mean diameter of 9 mm in the right lower lobe. Non-contrast chest CT at 3-6 months is recommended. If the nodules are stable at time of repeat CT, then future CT at 18-24 months (from today's scan) is considered optional for low-risk patients, but is recommended for high-risk patients. This recommendation follows the consensus statement: Guidelines for Management of  Incidental Pulmonary Nodules Detected on CT Images: From the Fleischner Society 2017; Radiology 2017; 284:228-243. 5. Colonic diverticulosis without evidence of acute diverticulitis at this time. 6. Additional incidental findings, as above. Electronically Signed   By: Trudie Reedaniel  Entrikin M.D.   On: 03/11/2019 12:27    Cardiac Studies   Dr. Orvan Julywen's consult reviewed   Patient Profile     85 y.o. male with syncope and severe AS  Assessment & Plan    1.  AS: severe.  High risk due to recurrent syncope.  Plan to keep him on house for TAVR on Tuesday.  2. Enox for DVT prophylaxis.  For questions or updates, please contact [facility] Please consult [email or web address] for contact info under        Signed, Lance MussJayadeep Tyerra Loretto, MD  03/13/2019, 10:02 AM

## 2019-03-14 NOTE — Progress Notes (Signed)
Progress Note  Patient Name: Dan Perez Date of Encounter: 03/14/2019  Primary Cardiologist: Prentice DockerSuresh Koneswaran, MD   Subjective   No complaints  Inpatient Medications    Scheduled Meds: . amLODipine  5 mg Oral Daily  . memantine  28 mg Oral QHS   And  . donepezil  10 mg Oral QHS  . enoxaparin (LOVENOX) injection  40 mg Subcutaneous Q24H  . feeding supplement (ENSURE ENLIVE)  237 mL Oral BID BM  . multivitamin with minerals  1 tablet Oral Daily  . polyethylene glycol  17 g Oral Daily  . sertraline  50 mg Oral Daily  . sodium chloride flush  3 mL Intravenous Q12H  . sodium chloride flush  3 mL Intravenous Q12H  . sodium chloride flush  3 mL Intravenous Q12H   Continuous Infusions: . sodium chloride    . sodium chloride     PRN Meds: sodium chloride, sodium chloride, acetaminophen, ondansetron (ZOFRAN) IV, sodium chloride flush, sodium chloride flush   Vital Signs    Vitals:   03/13/19 0549 03/13/19 1143 03/13/19 2044 03/14/19 0516  BP: (!) 179/85 (!) 133/59 113/75 (!) 148/70  Pulse: (!) 55 62 64 (!) 54  Resp: 16 20 16 16   Temp: (!) 97.4 F (36.3 C) 97.9 F (36.6 C) 98.8 F (37.1 C) 98 F (36.7 C)  TempSrc: Oral Oral Oral   SpO2: 96% 98% 97% 97%  Weight:    61.9 kg  Height:        Intake/Output Summary (Last 24 hours) at 03/14/2019 0832 Last data filed at 03/14/2019 0818 Gross per 24 hour  Intake 840 ml  Output 2475 ml  Net -1635 ml   Last 3 Weights 03/14/2019 03/13/2019 03/12/2019  Weight (lbs) 136 lb 6.4 oz 137 lb 14.4 oz 182 lb 1.6 oz  Weight (kg) 61.871 kg 62.551 kg 82.6 kg      Telemetry    NSR - Personally Reviewed  ECG    n/a - Personally Reviewed  Physical Exam   GEN: No acute distress.   Neck: No JVD Cardiac: RRR, 3/6 systolic murmur rusb, no jvd Respiratory: Clear to auscultation bilaterally. GI: Soft, nontender, non-distended  MS: No edema; No deformity. Neuro:  Nonfocal  Psych: Normal affect   Labs    High Sensitivity  Troponin:  No results for input(s): TROPONINIHS in the last 720 hours.    Cardiac EnzymesNo results for input(s): TROPONINI in the last 168 hours. No results for input(s): TROPIPOC in the last 168 hours.   Chemistry Recent Labs  Lab 03/09/19 [phone] 03/10/19 0433 03/10/19 1045 03/10/19 1051 03/11/19 0639  NA 133* 133* 136 139 136  K 5.2* 5.0 4.4 4.1 5.1  CL 98 97*  --   --  100  CO2 29 29  --   --  29  GLUCOSE 94 91  --   --  91  BUN 20 22  --   --  22  CREATININE 1.32* 1.27*  --   --  1.16  CALCIUM 8.7* 8.6*  --   --  8.8*  GFRNONAA 49* 52*  --   --  58*  GFRAA 57* 60*  --   --  >60  ANIONGAP 6 7  --   --  7     Hematology Recent Labs  Lab 03/10/19 1045 03/10/19 1051 03/11/19 0639  WBC  --   --  6.7  RBC  --   --  3.75*  HGB 11.9* 10.9*  11.0*  HCT 35.0* 32.0* 33.9*  MCV  --   --  90.4  MCH  --   --  29.3  MCHC  --   --  32.4  RDW  --   --  13.6  PLT  --   --  174    BNPNo results for input(s): BNP, PROBNP in the last 168 hours.   DDimer No results for input(s): DDIMER in the last 168 hours.   Radiology    No results found.  Cardiac Studies    Patient Profile    85 y.o. male with syncope and severe AS awaiting TAVR.   Assessment & Plan    1. Severe aortic stenosis with syncope - 01/2019 echo LVEF 55-60%, severe AS by AVA and VTI but mean gradient 24 mmHg - 02/2019 cath mild nonobstructive disease. Unable to cross AV valve due to technical issues  - being worked up for TAVR on Tuesday, has been kept in house due to high risk given severe AS with recurrent syncope    For questions or updates, please contact [place] Please consult [email or web address] for contact info under        Signed, [name], MD  03/14/2019, 8:32 AM

## 2019-03-15 NOTE — Plan of Care (Signed)
  Problem: Clinical Measurements: Goal: Ability to maintain clinical measurements within normal limits will improve Outcome: Progressing   

## 2019-03-15 NOTE — Progress Notes (Signed)
Progress Note  Patient Name: Dan Perez Date of Encounter: 03/15/2019  Primary Cardiologist: Prentice DockerSuresh Koneswaran, MD   Subjective   No complaints  Inpatient Medications    Scheduled Meds: . amLODipine  5 mg Oral Daily  . memantine  28 mg Oral QHS   And  . donepezil  10 mg Oral QHS  . enoxaparin (LOVENOX) injection  40 mg Subcutaneous Q24H  . feeding supplement (ENSURE ENLIVE)  237 mL Oral BID BM  . multivitamin with minerals  1 tablet Oral Daily  . polyethylene glycol  17 g Oral Daily  . sertraline  50 mg Oral Daily  . sodium chloride flush  3 mL Intravenous Q12H  . sodium chloride flush  3 mL Intravenous Q12H  . sodium chloride flush  3 mL Intravenous Q12H   Continuous Infusions: . sodium chloride    . sodium chloride     PRN Meds: sodium chloride, sodium chloride, acetaminophen, ondansetron (ZOFRAN) IV, sodium chloride flush, sodium chloride flush   Vital Signs    Vitals:   03/14/19 1418 03/14/19 1915 03/15/19 0534 03/15/19 0537  BP: 121/73 (!) 143/71  (!) 156/81  Pulse: 66 69  (!) 59  Resp: 18 18  18   Temp: 97.6 F (36.4 C) 98 F (36.7 C)  97.7 F (36.5 C)  TempSrc: Oral   Oral  SpO2: 95% 94%  97%  Weight:   61.2 kg   Height:        Intake/Output Summary (Last 24 hours) at 03/15/2019 0806 Last data filed at 03/15/2019 0535 Gross per 24 hour  Intake 120 ml  Output 1250 ml  Net -1130 ml   Last 3 Weights 03/15/2019 03/14/2019 03/13/2019  Weight (lbs) 134 lb 14.7 oz 136 lb 6.4 oz 137 lb 14.4 oz  Weight (kg) 61.2 kg 61.871 kg 62.551 kg      Telemetry    NSR - Personally Reviewed  ECG    n/a - Personally Reviewed  Physical Exam   GEN: No acute distress.   Neck: No JVD Cardiac: RRR, 3/6 systolic murmur rusb  Respiratory: Clear to auscultation bilaterally. GI: Soft, nontender, non-distended  MS: No edema; No deformity. Neuro:  Nonfocal  Psych: Normal affect   Labs    High Sensitivity Troponin:  No results for input(s): TROPONINIHS in  the last 720 hours.    Cardiac EnzymesNo results for input(s): TROPONINI in the last 168 hours. No results for input(s): TROPIPOC in the last 168 hours.   Chemistry Recent Labs  Lab 03/09/19 [phone] 03/10/19 0433 03/10/19 1045 03/10/19 1051 03/11/19 0639  NA 133* 133* 136 139 136  K 5.2* 5.0 4.4 4.1 5.1  CL 98 97*  --   --  100  CO2 29 29  --   --  29  GLUCOSE 94 91  --   --  91  BUN 20 22  --   --  22  CREATININE 1.32* 1.27*  --   --  1.16  CALCIUM 8.7* 8.6*  --   --  8.8*  GFRNONAA 49* 52*  --   --  58*  GFRAA 57* 60*  --   --  >60  ANIONGAP 6 7  --   --  7     Hematology Recent Labs  Lab 03/10/19 1045 03/10/19 1051 03/11/19 0639  WBC  --   --  6.7  RBC  --   --  3.75*  HGB 11.9* 10.9* 11.0*  HCT 35.0* 32.0* 33.9*  MCV  --   --  90.4  MCH  --   --  29.3  MCHC  --   --  32.4  RDW  --   --  13.6  PLT  --   --  174    BNPNo results for input(s): BNP, PROBNP in the last 168 hours.   DDimer No results for input(s): DDIMER in the last 168 hours.   Radiology    No results found.  Cardiac Studies    Patient Profile      85 y.o.malewith syncope and severe AS awaiting TAVR.   Assessment & Plan    1. Severe aortic stenosis with syncope - 01/2019 echo LVEF 55-60%, severe AS by AVA and VTI but mean gradient 24 mmHg - 02/2019 cath mild nonobstructive disease. Unable to cross AV valve due to technical issues  - being worked up for TAVR on Tuesday, has been kept in house due to high risk given severe AS with recurrent syncope  - will need repeat labs for Tuesday AM, have not been checking over the weekend - nothing new for today    For questions or updates, please contact [place] Please consult [email or web address] for contact info under        Signed, [name], MD  03/15/2019, 8:06 AM

## 2019-03-16 ENCOUNTER — Inpatient Hospital Stay (HOSPITAL_COMMUNITY): Payer: PPO

## 2019-03-16 DIAGNOSIS — E43 Unspecified severe protein-calorie malnutrition: Secondary | ICD-10-CM | POA: Insufficient documentation

## 2019-03-16 DIAGNOSIS — I350 Nonrheumatic aortic (valve) stenosis: Secondary | ICD-10-CM

## 2019-03-16 LAB — TYPE AND SCREEN
ABO/RH(D): A POS
Antibody Screen: NEGATIVE

## 2019-03-16 LAB — URINALYSIS, ROUTINE W REFLEX MICROSCOPIC
Bacteria, UA: NONE SEEN
Bilirubin Urine: NEGATIVE
Glucose, UA: NEGATIVE mg/dL
Hgb urine dipstick: NEGATIVE
Ketones, ur: NEGATIVE mg/dL
Nitrite: NEGATIVE
Protein, ur: NEGATIVE mg/dL
Specific Gravity, Urine: 1.015 (ref 1.005–1.030)
pH: 7 (ref 5.0–8.0)

## 2019-03-16 LAB — SURGICAL PCR SCREEN
MRSA, PCR: NEGATIVE
Staphylococcus aureus: NEGATIVE

## 2019-03-16 LAB — PROTIME-INR
INR: 1.1 (ref 0.8–1.2)
Prothrombin Time: 13.7 seconds (ref 11.4–15.2)

## 2019-03-16 MED ORDER — CHLORHEXIDINE GLUCONATE 4 % EX LIQD
1.0000 "application " | Freq: Once | CUTANEOUS | Status: AC
  Administered 2019-03-16: 1 via TOPICAL
  Filled 2019-03-16: qty 30

## 2019-03-16 MED ORDER — NOREPINEPHRINE 4 MG/250ML-% IV SOLN
0.0000 ug/min | INTRAVENOUS | Status: AC
  Administered 2019-03-17: 2 ug/min via INTRAVENOUS
  Filled 2019-03-16: qty 250

## 2019-03-16 MED ORDER — SODIUM CHLORIDE 0.9 % IV SOLN
INTRAVENOUS | Status: DC
  Filled 2019-03-16: qty 30

## 2019-03-16 MED ORDER — CHLORHEXIDINE GLUCONATE 0.12 % MT SOLN
15.0000 mL | Freq: Once | OROMUCOSAL | Status: AC
  Administered 2019-03-17: 15 mL via OROMUCOSAL
  Filled 2019-03-16: qty 15

## 2019-03-16 MED ORDER — SODIUM CHLORIDE 0.9 % IV SOLN
1.5000 g | INTRAVENOUS | Status: AC
  Administered 2019-03-17: 1.5 g via INTRAVENOUS
  Filled 2019-03-16: qty 1.5

## 2019-03-16 MED ORDER — BISACODYL 5 MG PO TBEC
5.0000 mg | DELAYED_RELEASE_TABLET | Freq: Once | ORAL | Status: AC
  Administered 2019-03-16: 5 mg via ORAL
  Filled 2019-03-16: qty 1

## 2019-03-16 MED ORDER — POTASSIUM CHLORIDE 2 MEQ/ML IV SOLN
80.0000 meq | INTRAVENOUS | Status: DC
  Filled 2019-03-16: qty 40

## 2019-03-16 MED ORDER — TEMAZEPAM 15 MG PO CAPS
15.0000 mg | ORAL_CAPSULE | Freq: Once | ORAL | Status: AC | PRN
  Administered 2019-03-16: 15 mg via ORAL
  Filled 2019-03-16: qty 1

## 2019-03-16 MED ORDER — VANCOMYCIN HCL 10 G IV SOLR
1250.0000 mg | INTRAVENOUS | Status: AC
  Administered 2019-03-17: 1250 mg via INTRAVENOUS
  Filled 2019-03-16: qty 1250

## 2019-03-16 MED ORDER — DEXMEDETOMIDINE HCL IN NACL 400 MCG/100ML IV SOLN
0.1000 ug/kg/h | INTRAVENOUS | Status: AC
  Administered 2019-03-17: 12:00:00 6.2 ug/kg/h via INTRAVENOUS
  Filled 2019-03-16: qty 100

## 2019-03-16 MED ORDER — MAGNESIUM SULFATE 50 % IJ SOLN
40.0000 meq | INTRAMUSCULAR | Status: DC
  Filled 2019-03-16: qty 9.85

## 2019-03-16 NOTE — Care Management Important Message (Signed)
Important Message  Patient Details  Name: Dan Perez MRN: [id number] Date of Birth: 03-03-1932   Medicare Important Message Given:  Yes     Orbie Pyo 03/16/2019, 3:52 PM

## 2019-03-16 NOTE — Progress Notes (Signed)
Nutrition Follow-up  DOCUMENTATION CODES:   Severe malnutrition in context of chronic illness  INTERVENTION:   -Continue MVI with minerals daily -Continue Ensure Enlive po BID, each supplement provides 350 kcal and 20 grams of protein -Magic cup TID with meals, each supplement provides 290 kcal and 9 grams of protein  NUTRITION DIAGNOSIS:   Severe Malnutrition related to chronic illness(severe aortic stenosis, CKD) as evidenced by severe fat depletion, severe muscle depletion, energy intake < 75% for > or equal to 1 month.  Ongoing  GOAL:   Patient will meet greater than or equal to 90% of their needs  Progressing  MONITOR:   PO intake, Supplement acceptance, Labs, Weight trends, Skin, I & O's  REASON FOR ASSESSMENT:   Malnutrition Screening Tool    ASSESSMENT:   85 y/o male with a h/o HTN, progressive/Severe aortic stenosis, stage III chronic kidney disease, moderate mitral regurgitation, dilated abdominal aorta, and coronary calcium noted on prior CT.  Aortic stenosis has been followed over the years with serial echocardiography.  Most recent echo in May of this year, showed progression to severe aortic stenosis with a relatively low mean gradient of 24 mmHg but with an aortic valve area of 0.75 cm.  He was recently evaluated by Dr. Bronson Ing on 6/9, b/c his dtr reported that he was having increasing dyspnea along with sudden spells of weakness during which, he would become very pale.  These spells were short lived.  Patient says that he has no recollection of these spells occurring, simply that his daughter tells him that they happen.  In the setting of concern for possible syncope and known severe AS, he was referred for TAVR eval and currently has an appt w/ Dr. Angelena Form on 7/6.  Of note, he has been markedly hypertensive at home and on office visits, and this has resulted in titration of his ACE inhibitor therapy.  He also takes Lasix.  6/23- s/p rt/lt heart cath with  coronary angiography  Reviewed I/O's: -880 ml x 24 hours and -7.4 L since admission  UOP: 1.6 L x 24 hours  Pt resting quietly in recliner chair at time of visit.   Spoke with RN and mobility tech, who both reports pt has a great appetite. Documented meal completion 100%. Pt is very compliant with Ensure supplements and enjoys them (per RN "he sucks those right down"). Plan for TAVR tomorrow.   Labs reviewed.   Diet Order:   Diet Order            Diet Heart Room service appropriate? Yes; Fluid consistency: Thin  Diet effective now              EDUCATION NEEDS:   Education needs have been addressed  Skin:  Skin Assessment: Skin Integrity Issues: Skin Integrity Issues:: Other (Comment), Incisions Incisions: lt head Other: MASD bilateral buttocks  Last BM:  03/16/19  Height:   Ht Readings from Last 1 Encounters:  03/06/19 6' (1.829 m)    Weight:   Wt Readings from Last 1 Encounters:  03/16/19 63.1 kg    Ideal Body Weight:  80.9 kg  BMI:  Body mass index is 18.87 kg/m.  Estimated Nutritional Needs:   Kcal:  1700-1900  Protein:  80-95 grams  Fluid:  > 1.7 L    Lailah Marcelli A. Jimmye Norman, RD, LDN, [place] Registered Dietitian II Certified Diabetes Care and Education Specialist Pager: [phone] After hours Pager: [phone]

## 2019-03-16 NOTE — Progress Notes (Addendum)
Progress Note  Patient Name: Dan Perez Date of Encounter: 03/16/2019  Primary Cardiologist: Prentice DockerSuresh Koneswaran, MD   Subjective   No chest pain or SOB, feels well  Inpatient Medications    Scheduled Meds: . amLODipine  5 mg Oral Daily  . memantine  28 mg Oral QHS   And  . donepezil  10 mg Oral QHS  . enoxaparin (LOVENOX) injection  40 mg Subcutaneous Q24H  . feeding supplement (ENSURE ENLIVE)  237 mL Oral BID BM  . multivitamin with minerals  1 tablet Oral Daily  . polyethylene glycol  17 g Oral Daily  . sertraline  50 mg Oral Daily  . sodium chloride flush  3 mL Intravenous Q12H  . sodium chloride flush  3 mL Intravenous Q12H  . sodium chloride flush  3 mL Intravenous Q12H   Continuous Infusions: . sodium chloride    . sodium chloride     PRN Meds: sodium chloride, sodium chloride, acetaminophen, ondansetron (ZOFRAN) IV, sodium chloride flush, sodium chloride flush   Vital Signs    Vitals:   03/15/19 0921 03/15/19 1529 03/15/19 2112 03/16/19 0545  BP: 116/60 109/67 138/70 134/81  Pulse: 65 66 70 (!) 57  Resp: 18 20 18 16   Temp: 97.7 F (36.5 C) 97.6 F (36.4 C) 98.6 F (37 C) 97.8 F (36.6 C)  TempSrc: Oral Oral Oral   SpO2: 97% 94% 97% 97%  Weight:    63.1 kg  Height:        Intake/Output Summary (Last 24 hours) at 03/16/2019 [id number] Last data filed at 03/16/2019 0600 Gross per 24 hour  Intake 720 ml  Output 1600 ml  Net -880 ml   Last 3 Weights 03/16/2019 03/15/2019 03/14/2019  Weight (lbs) 139 lb 1.8 oz 134 lb 14.7 oz 136 lb 6.4 oz  Weight (kg) 63.1 kg 61.2 kg 61.871 kg      Telemetry    SR, no ectopy noted- Personally Reviewed  ECG    n/a - Personally Reviewed  Physical Exam   General: Well developed, frail, elderly, male in no acute distress Head: Eyes PERRLA, No xanthomas.   Normocephalic and resolving bruises on face w/ hematoma forehead. Sutures above L eye Lungs: scattered dry rales bilaterally to auscultation. Heart: HRRR S1  S2, without RG.  2-3/6 AS murmur Pulses are 2+ & equal. No JVD. Abdomen: Bowel sounds are present, abdomen soft and non-tender without masses or  hernias noted. Msk: Normal strength and tone for age. Extremities: No clubbing, cyanosis or edema.    Skin:  No rashes or lesions noted. Neuro: Alert and oriented X 3. Psych:  Good affect, responds appropriately   Labs    Chemistry Recent Labs  Lab 03/10/19 0433 03/10/19 1045 03/10/19 1051 03/11/19 0639  NA 133* 136 139 136  K 5.0 4.4 4.1 5.1  CL 97*  --   --  100  CO2 29  --   --  29  GLUCOSE 91  --   --  91  BUN 22  --   --  22  CREATININE 1.27*  --   --  1.16  CALCIUM 8.6*  --   --  8.8*  GFRNONAA 52*  --   --  58*  GFRAA 60*  --   --  >60  ANIONGAP 7  --   --  7     Hematology Recent Labs  Lab 03/10/19 1045 03/10/19 1051 03/11/19 0639  WBC  --   --  6.7  RBC  --   --  3.75*  HGB 11.9* 10.9* 11.0*  HCT 35.0* 32.0* 33.9*  MCV  --   --  90.4  MCH  --   --  29.3  MCHC  --   --  32.4  RDW  --   --  13.6  PLT  --   --  174     Radiology    No results found.  Cardiac Studies   CAROTID DOPPLERS: 03/10/2019 Summary: Right Carotid: Velocities in the right ICA are consistent with a 1-39% stenosis.  Left Carotid: Velocities in the left ICA are consistent with a 1-39% stenosis.  Vertebrals: Bilateral vertebral arteries demonstrate antegrade flow.  CARDIAC CATH: 03/10/2019  Ost RCA to Prox RCA lesion is 30% stenosed.  Prox Cx to Mid Cx lesion is 20% stenosed.  Mid LAD lesion is 20% stenosed.   1. Mild non-obstructive CAD 2. Unable to cross into the LV with a diagnostic catheter. The AL-1 was too short given his height and arm length). The long pigtail would not cross the valve given the severe angulation of the horizontal aortic root.   Recommendations: Will continue inpatient workup for TAVR. Will plans CT scans tomorrow if renal function is stable. Dr. Roxy Manns will also see over the next day.   ECHO:  01/28/2019  1. The left ventricle has normal systolic function, with an ejection fraction of 55-60%. The cavity size was normal. There is mildly increased left ventricular wall thickness. Left ventricular diastolic Doppler parameters are consistent with impaired  relaxation. No evidence of left ventricular regional wall motion abnormalities.  2. The right ventricle has normal systolic function. The cavity was normal. There is no increase in right ventricular wall thickness.  3. Small pericardial effusion.  4. The pericardial effusion is anterior to the right ventricle. Also possible left pleural effusion.  5. The aortic valve has an indeterminate number of cusps. Severe calcifcation of the aortic valve. Suspect evere stenosis of the aortic valve. Mean gradient is only 24 mmHg, but the calculated valve area and dimentionless index both are consistent with  severe stenosis. Moderate aortic annular calcification noted.  6. The mitral valve is degenerative. Mild thickening of the mitral valve leaflet. There is mild mitral annular calcification present. Mitral valve regurgitation is moderate by color flow Doppler.  7. The tricuspid valve is grossly normal.  8. The aortic root is normal in size and structure.    Patient Profile      85 y.o.malewith syncope and severe AS awaiting TAVR.   Assessment & Plan    1. Severe aortic stenosis with syncope - see echo and cath reports above, severe AS w/ no sig CAD - plan for TAVR 06/30 - kept in-house till surgery for recurrent syncope - recheck CBC and BMET in am  2. Syncope w/ injuries: - sutures put in at [facility] - Discuss removing sutures w/ MD  3. Malnutrition: - Nutritionist to see  For questions or updates, please contact [place] Please consult [email or web address] for contact info under        Signed, Rosaria Ferries, PA-C  03/16/2019, 7:05 AM    Attending Note:   The patient was seen and examined.  Agree with assessment and plan as  noted above.  Changes made to the above note as needed.  Patient seen and independently examined with Rosaria Ferries, PA .   We discussed all aspects of the encounter. I agree with the assessment and plan as stated  above.  1.  Syncope:   Likely due to severe AS .   No arrhythmias on tele .  2.  Aortic stenosis:   Severe by echo .   Plan is for TAVR tomorrow .  He is agreeable     I have spent a total of 40 minutes with patient reviewing hospital  notes , telemetry, EKGs, labs and examining patient as well as establishing an assessment and plan that was discussed with the patient. > 50% of time was spent in direct patient care.    Vesta MixerPhilip J. Karri Kallenbach, Montez HagemanJr., MD, [facility] CenterFACC 03/16/2019, 8:25 AM [address],  [address] Office [phone] Pager [phone]

## 2019-03-16 NOTE — Progress Notes (Signed)
[address]       [place] [id number]             [phone]     CARDIOTHORACIC SURGERY PROGRESS NOTE  6 Days Post-Op  S/P Procedure(s) (LRB): RIGHT HEART CATH AND CORONARY ANGIOGRAPHY (N/A)  Subjective: No complaints  Objective: Vital signs in last 24 hours: Temp:  [97.5 F (36.4 C)-98.6 F (37 C)] 97.5 F (36.4 C) (06/29 1109) Pulse Rate:  [57-70] 65 (06/29 1109) Cardiac Rhythm: Normal sinus rhythm (06/28 1900) Resp:  [16-20] 18 (06/29 1109) BP: (109-140)/(63-81) 140/63 (06/29 1109) SpO2:  [94 %-98 %] 98 % (06/29 1109) Weight:  [63.1 kg] 63.1 kg (06/29 0545)  Physical Exam:  Rhythm:   sinus  Breath sounds: clear  Heart sounds:  RRR w/ harsh systolic murmur  Incisions:  n/a  Abdomen:  soft  Extremities:  warm   Intake/Output from previous day: 06/28 0701 - 06/29 0700 In: 720 [P.O.:720] Out: 1600 [Urine:1600] Intake/Output this shift: Total I/O In: 240 [P.O.:240] Out: 700 [Urine:700]  Lab Results: No results for input(s): WBC, HGB, HCT, PLT in the last 72 hours. BMET: No results for input(s): NA, K, CL, CO2, GLUCOSE, BUN, CREATININE, CALCIUM in the last 72 hours.  CBG (last 3)  No results for input(s): GLUCAP in the last 72 hours. PT/INR:  No results for input(s): LABPROT, INR in the last 72 hours.  CXR:  N/A  Assessment/Plan: S/P Procedure(s) (LRB): RIGHT HEART CATH AND CORONARY ANGIOGRAPHY (N/A)  We tentatively plan to proceed with transcatheter aortic valve replacement via transfemoral approach in the operating room tomorrow.  I spoke with Mr. Layfield at the bedside and I had an extensive conversation with his daughter Ms. Kendell BaneMiranda Zamudio over the telephone this afternoon.  Both were counseled at length regarding treatment alternatives for management of severe symptomatic aortic stenosis. Alternative approaches such as conventional aortic valve replacement, transcatheter aortic valve replacement, and continued medical therapy without  intervention were compared and contrasted at length.  The risks associated with conventional surgical aortic valve replacement were discussed in detail, as were expectations for post-operative convalescence, and why I would be reluctant to consider this patient a candidate for conventional surgery.  Issues specific to transcatheter aortic valve replacement were discussed including questions about long term valve durability, the potential for paravalvular leak, possible increased risk of need for permanent pacemaker placement, and other technical complications related to the procedure itself.  Long-term prognosis with medical therapy was discussed. This discussion was placed in the context of the patient's own specific clinical presentation and past medical history.  Concerns related to the presence of the patient's infrarenal abdominal aortic aneurysm and tortuous thoracic aorta were discussed.  Expectations for his postoperative convalescence have been discussed.  All of their questions have been addressed.    Following the decision to proceed with transcatheter aortic valve replacement, a discussion has been held regarding what types of management strategies would be attempted intraoperatively in the event of life-threatening complications, including whether or not the patient would be considered a candidate for the use of cardiopulmonary bypass and/or conversion to open sternotomy for attempted surgical intervention.  The patient has been advised of a variety of complications that might develop including but not limited to risks of death, stroke, paravalvular leak, aortic dissection or other major vascular complications, aortic annulus rupture, device embolization, cardiac rupture or perforation, mitral regurgitation, acute myocardial infarction, arrhythmia, heart block or bradycardia requiring permanent pacemaker placement, congestive  heart failure, respiratory failure, renal failure, pneumonia, infection,  other late complications related to structural valve deterioration or migration, or other complications that might ultimately cause a temporary or permanent loss of functional independence or other long term morbidity.  The patient provides full informed consent for the procedure as described and all questions were answered.   I spent in excess of 15 minutes during the conduct of this hospital encounter and >50% of this time involved direct face-to-face encounter with the patient for counseling and/or coordination of their care.     Rexene Alberts, MD 03/16/2019 1:35 PM

## 2019-03-16 NOTE — Progress Notes (Signed)
Occupational Therapy Treatment Patient Details Name: Dan Perez MRN: [id number] DOB: 1932-01-09 Today's Date: 03/16/2019    History of present illness 85 y/o m with a h/o progressive and now severe aortic stenosis, moderate MR, HTN, CKD III, and coronary calcium, tranferred from [facility] for admission following syncope.Admitted for evaluation of AS. s/p cardiac cath 6/23. Currently being worked up for TAVR   OT comments  Patient supine in bed, agreeable to OT. Able to complete bed mobility with supervision, toilet transfers with min assist, and grooming standing at sink with min guard for safety (supervision when seated). Continues to present with decreased awareness, impaired memory, and disorientation to time and situation, but pleasantly confused.  Will continue to follow acutely, will require 24/7 support at dc and recommend [facility] services.     Follow Up Recommendations  Home health OT;Supervision/Assistance - 24 hour    Equipment Recommendations  3 in 1 bedside commode    Recommendations for Other Services      Precautions / Restrictions Precautions Precautions: Fall Precaution Comments: hx of syncope with fall Restrictions Weight Bearing Restrictions: No       Mobility Bed Mobility Overal bed mobility: Needs Assistance Bed Mobility: Supine to Sit;Sit to Supine     Supine to sit: Supervision Sit to supine: Supervision   General bed mobility comments: increased time and effort, no physical assist required  Transfers Overall transfer level: Needs assistance Equipment used: Rolling walker (2 wheeled) Transfers: Sit to/from Stand Sit to Stand: Min guard;Min assist         General transfer comment: cueing for hand placement and safety, min guard from EOB and min assist from standard chair (simulated commode) to power up     Balance Overall balance assessment: Needs assistance Sitting-balance support: Feet supported;No upper extremity supported Sitting  balance-Leahy Scale: Good     Standing balance support: No upper extremity supported;During functional activity Standing balance-Leahy Scale: Poor Standing balance comment: relaint on B UE support during mobility, able to engage in grooming tasks standing with 0 hand support given min guard                           ADL either performed or assessed with clinical judgement   ADL Overall ADL's : Needs assistance/impaired     Grooming: Wash/dry hands;Wash/dry face;Oral care;Min guard;Standing Grooming Details (indicate cue type and reason): pt initated tasks standing, transitioned to sitting with fatigue (to complete oral care)                  Toilet Transfer: Minimal assistance;Ambulation;RW Toilet Transfer Details (indicate cue type and reason): simulated in room         Functional mobility during ADLs: Rolling walker;Minimal assistance       Vision       Perception     Praxis      Cognition Arousal/Alertness: Awake/alert Behavior During Therapy: WFL for tasks assessed/performed Overall Cognitive Status: Impaired/Different from baseline Area of Impairment: Orientation;Memory;Problem solving;Safety/judgement;Following commands;Awareness                 Orientation Level: Disoriented to;Time;Situation   Memory: Decreased short-term memory Following Commands: Follows one step commands with increased time Safety/Judgement: Decreased awareness of deficits;Decreased awareness of safety Awareness: Intellectual Problem Solving: Slow processing;Requires verbal cues;Requires tactile cues          Exercises     Shoulder Instructions       General Comments  Pertinent Vitals/ Pain       Pain Assessment: No/denies pain  Home Living                                          Prior Functioning/Environment              Frequency  Min 2X/week        Progress Toward Goals  OT Goals(current goals can now be found  in the care plan section)  Progress towards OT goals: Progressing toward goals  Acute Rehab OT Goals Patient Stated Goal: go home with daughters OT Goal Formulation: With patient  Plan Discharge plan remains appropriate;Frequency remains appropriate    Co-evaluation                 AM-PAC OT "6 Clicks" Daily Activity     Outcome Measure   Help from another person eating meals?: None Help from another person taking care of personal grooming?: A Little Help from another person toileting, which includes using toliet, bedpan, or urinal?: A Little Help from another person bathing (including washing, rinsing, drying)?: A Little Help from another person to put on and taking off regular upper body clothing?: None Help from another person to put on and taking off regular lower body clothing?: A Little 6 Click Score: 20    End of Session Equipment Utilized During Treatment: Gait belt;Rolling walker  OT Visit Diagnosis: Unsteadiness on feet (R26.81);Other abnormalities of gait and mobility (R26.89);Other symptoms and signs involving cognitive function;Muscle weakness (generalized) (M62.81)   Activity Tolerance Patient tolerated treatment well   Patient Left in bed;with call bell/phone within reach;with bed alarm set   Nurse Communication Mobility status        Time: [id number] OT Time Calculation (min): 21 min  Charges: OT General Charges $OT Visit: 1 Visit OT Treatments $Self Care/Home Management : 8-22 mins  Chancy Milroyhristie S Leonda Cristo, OT Acute Rehabilitation Services Pager [phone] Office [phone]    Chancy MilroyChristie S Talha Iser 03/16/2019, 10:10 AM

## 2019-03-16 NOTE — Progress Notes (Signed)
Physical Therapy Treatment Patient Details Name: Dan Perez MRN: [id number] DOB: [birth year] Today's Date: 03/16/2019    History of Present Illness 85 y/o m with a h/o progressive and now severe aortic stenosis, moderate MR, HTN, CKD III, and coronary calcium, tranferred from [facility] for admission following syncope.Admitted for evaluation of AS. s/p cardiac cath 6/23. Currently being worked up for TAVR    PT Comments    Pt very nice and willing to walk with pt requiring increased time and effort for all transfers with decreased gait tolerance this session. Pt educated for HEP and will continue to follow to maximize independence and function. End of session pt requesting drink and when placed on his table with spoon directly in front of him he still asked for spoon, unsure if due to vision or lack of attention to environment.   HR 62-83 with activity    Follow Up Recommendations  Supervision/Assistance - 24 hour;Home health PT     Equipment Recommendations  Rolling walker with 5" wheels    Recommendations for Other Services       Precautions / Restrictions Precautions Precautions: Fall Precaution Comments: hx of syncope with fall Restrictions Weight Bearing Restrictions: No    Mobility  Bed Mobility Overal bed mobility: Needs Assistance Bed Mobility: Supine to Sit     Supine to sit: Min guard Sit to supine: Supervision   General bed mobility comments: increased time and effort, no physical assist required with bed flat and use of rail  Transfers Overall transfer level: Needs assistance Equipment used: Rolling walker (2 wheeled) Transfers: Sit to/from Stand Sit to Stand: Min guard         General transfer comment: cueing for hand placement and safety, min guard from EOB with momentum and increased time  Ambulation/Gait Ambulation/Gait assistance: Min guard Gait Distance (Feet): 250 Feet Assistive device: Rolling walker (2 wheeled) Gait Pattern/deviations:  Step-through pattern;Narrow base of support;Trunk flexed;Decreased stride length   Gait velocity interpretation: 1.31 - 2.62 ft/sec, indicative of limited community ambulator General Gait Details: min assist for directing RW at times with cues for direction, proximity to RW and posture. Pt maintains flexed trunk with decreased safety awareness   Stairs             Wheelchair Mobility    Modified Rankin (Stroke Patients Only)       Balance Overall balance assessment: Needs assistance Sitting-balance support: Feet supported;No upper extremity supported Sitting balance-Leahy Scale: Good Sitting balance - Comments: able to sit EOB without UE assist   Standing balance support: Bilateral upper extremity supported Standing balance-Leahy Scale: Poor Standing balance comment: bil UE support for gait                            Cognition Arousal/Alertness: Awake/alert Behavior During Therapy: WFL for tasks assessed/performed Overall Cognitive Status: Impaired/Different from baseline Area of Impairment: Orientation;Problem solving;Safety/judgement                 Orientation Level: Disoriented to;Time   Memory: Decreased short-term memory Following Commands: Follows one step commands consistently Safety/Judgement: Decreased awareness of safety;Decreased awareness of deficits Awareness: Intellectual Problem Solving: Slow processing        Exercises Total Joint Exercises Hip ABduction/ADduction: AROM;20 reps;Both;Seated Long Arc Quad: AROM;20 reps;Both;Seated Marching in Standing: AROM;20 reps;Both;Seated    General Comments        Pertinent Vitals/Pain Pain Assessment: No/denies pain    Home Living  Prior Function            PT Goals (current goals can now be found in the care plan section) Acute Rehab PT Goals Patient Stated Goal: go home with daughters Progress towards PT goals: Progressing toward goals     Frequency           PT Plan Current plan remains appropriate    Co-evaluation              AM-PAC PT "6 Clicks" Mobility   Outcome Measure  Help needed turning from your back to your side while in a flat bed without using bedrails?: None Help needed moving from lying on your back to sitting on the side of a flat bed without using bedrails?: A Little Help needed moving to and from a bed to a chair (including a wheelchair)?: A Little Help needed standing up from a chair using your arms (e.g., wheelchair or bedside chair)?: A Little Help needed to walk in hospital room?: A Little Help needed climbing 3-5 steps with a railing? : A Little 6 Click Score: 19    End of Session Equipment Utilized During Treatment: Gait belt Activity Tolerance: Patient tolerated treatment well Patient left: in chair;with call bell/phone within reach;with chair alarm set Nurse Communication: Mobility status PT Visit Diagnosis: Unsteadiness on feet (R26.81);Other abnormalities of gait and mobility (R26.89);Muscle weakness (generalized) (M62.81);History of falling (Z91.81);Difficulty in walking, not elsewhere classified (R26.2)     Time: [id number] PT Time Calculation (min) (ACUTE ONLY): 24 min  Charges:  $Gait Training: 8-22 mins $Therapeutic Exercise: 8-22 mins                     Meredeth Furber Abner Greenspanabor Armida Vickroy, PT Acute Rehabilitation Services Pager: [phone] Office: [phone]    Enedina FinnerMaija B Derel Mcglasson 03/16/2019, 12:45 PM

## 2019-03-16 NOTE — Progress Notes (Signed)
This am patient had questionable tachy arrhythmia HR up to 150 at the time, patient asymptomatic. Cardiology PA notified no new order given, v/s stable. Will continue to monitor the patient.

## 2019-03-16 NOTE — Progress Notes (Signed)
   Sutures over L eye were placed at [facility] 06/19 prior to arrival at [facility].   Sutures removed using sterile technique.   No stitch abscesses seen.   Pt tolerated well.   Rosaria Ferries, PA-C 03/16/2019 2:20 PM Beeper [phone]

## 2019-03-17 ENCOUNTER — Inpatient Hospital Stay (HOSPITAL_COMMUNITY)
Admission: AD | Admit: 2019-03-17 | Discharge: 2019-03-17 | Disposition: A | Discharge status: Home / Self Care | Payer: PPO | Source: Other Acute Inpatient Hospital | Attending: Cardiovascular Disease | Admitting: Cardiovascular Disease

## 2019-03-17 ENCOUNTER — Inpatient Hospital Stay (HOSPITAL_COMMUNITY): Payer: PPO

## 2019-03-17 ENCOUNTER — Encounter (HOSPITAL_COMMUNITY)
Admission: AD | Disposition: A | Discharge status: Skilled Nursing Facility | Payer: PPO | Source: Other Acute Inpatient Hospital | Attending: Cardiovascular Disease

## 2019-03-17 ENCOUNTER — Inpatient Hospital Stay (HOSPITAL_COMMUNITY): Payer: PPO | Admitting: Certified Registered"

## 2019-03-17 ENCOUNTER — Other Ambulatory Visit: Payer: Self-pay

## 2019-03-17 ENCOUNTER — Encounter (HOSPITAL_COMMUNITY): Payer: Self-pay | Admitting: Certified Registered"

## 2019-03-17 DIAGNOSIS — I350 Nonrheumatic aortic (valve) stenosis: Secondary | ICD-10-CM

## 2019-03-17 DIAGNOSIS — Z006 Encounter for examination for normal comparison and control in clinical research program: Secondary | ICD-10-CM

## 2019-03-17 DIAGNOSIS — Z952 Presence of prosthetic heart valve: Secondary | ICD-10-CM

## 2019-03-17 DIAGNOSIS — Z954 Presence of other heart-valve replacement: Secondary | ICD-10-CM

## 2019-03-17 HISTORY — PX: INTRAOPERATIVE TRANSTHORACIC ECHOCARDIOGRAM: SHX6523

## 2019-03-17 HISTORY — DX: Presence of prosthetic heart valve: Z95.2

## 2019-03-17 HISTORY — PX: TRANSCATHETER AORTIC VALVE REPLACEMENT, TRANSFEMORAL: SHX6400

## 2019-03-17 LAB — POCT I-STAT 4, (NA,K, GLUC, HGB,HCT)
Glucose, Bld: 90 mg/dL (ref 70–99)
Glucose, Bld: 102 mg/dL — ABNORMAL HIGH (ref 70–99)
HCT: 35 % — ABNORMAL LOW (ref 39.0–52.0)
HCT: 29 % — ABNORMAL LOW (ref 39.0–52.0)
Hemoglobin: 11.9 g/dL — ABNORMAL LOW (ref 13.0–17.0)
Hemoglobin: 9.9 g/dL — ABNORMAL LOW (ref 13.0–17.0)
Potassium: 4.6 mmol/L (ref 3.5–5.1)
Potassium: 4.6 mmol/L (ref 3.5–5.1)
Sodium: 133 mmol/L — ABNORMAL LOW (ref 135–145)
Sodium: 135 mmol/L (ref 135–145)

## 2019-03-17 LAB — COMPREHENSIVE METABOLIC PANEL
ALT: 17 U/L (ref 0–44)
AST: 21 U/L (ref 15–41)
Albumin: 3 g/dL — ABNORMAL LOW (ref 3.5–5.0)
Alkaline Phosphatase: 58 U/L (ref 38–126)
Anion gap: 6 (ref 5–15)
BUN: 31 mg/dL — ABNORMAL HIGH (ref 8–23)
CO2: 30 mmol/L (ref 22–32)
Calcium: 8.8 mg/dL — ABNORMAL LOW (ref 8.9–10.3)
Chloride: 96 mmol/L — ABNORMAL LOW (ref 98–111)
Creatinine, Ser: 1.15 mg/dL (ref 0.61–1.24)
GFR calc Af Amer: >60 mL/min (ref >60)
GFR calc non Af Amer: 58 mL/min — ABNORMAL LOW (ref >60)
Glucose, Bld: 94 mg/dL (ref 70–99)
Potassium: 5.4 mmol/L — ABNORMAL HIGH (ref 3.5–5.1)
Sodium: 132 mmol/L — ABNORMAL LOW (ref 135–145)
Total Bilirubin: 0.4 mg/dL (ref 0.3–1.2)
Total Protein: 6.3 g/dL — ABNORMAL LOW (ref 6.5–8.1)

## 2019-03-17 LAB — POCT I-STAT, CHEM 8
BUN: 26 mg/dL — ABNORMAL HIGH (ref 8–23)
Calcium, Ion: 1.16 mmol/L (ref 1.15–1.40)
Chloride: 99 mmol/L (ref 98–111)
Creatinine, Ser: 1 mg/dL (ref 0.61–1.24)
Glucose, Bld: 102 mg/dL — ABNORMAL HIGH (ref 70–99)
HCT: 31 % — ABNORMAL LOW (ref 39.0–52.0)
Hemoglobin: 10.5 g/dL — ABNORMAL LOW (ref 13.0–17.0)
Potassium: 5 mmol/L (ref 3.5–5.1)
Sodium: 134 mmol/L — ABNORMAL LOW (ref 135–145)
TCO2: 29 mmol/L (ref 22–32)

## 2019-03-17 LAB — CBC
HCT: 35.1 % — ABNORMAL LOW (ref 39.0–52.0)
Hemoglobin: 11.1 g/dL — ABNORMAL LOW (ref 13.0–17.0)
MCH: 28.7 pg (ref 26.0–34.0)
MCHC: 31.6 g/dL (ref 30.0–36.0)
MCV: 90.7 fL (ref 80.0–100.0)
Platelets: 198 10*3/uL (ref 150–400)
RBC: 3.87 MIL/uL — ABNORMAL LOW (ref 4.22–5.81)
RDW: 13.6 % (ref 11.5–15.5)
WBC: 7.1 10*3/uL (ref 4.0–10.5)
nRBC: 0 % (ref 0.0–0.2)

## 2019-03-17 LAB — ABO/RH: ABO/RH(D): A POS

## 2019-03-17 LAB — HEMOGLOBIN A1C
Hgb A1c MFr Bld: 5.7 % — ABNORMAL HIGH (ref 4.8–5.6)
Mean Plasma Glucose: 116.89 mg/dL

## 2019-03-17 LAB — APTT: aPTT: 31 seconds (ref 24–36)

## 2019-03-17 SURGERY — IMPLANTATION, AORTIC VALVE, TRANSCATHETER, FEMORAL APPROACH
Anesthesia: Monitor Anesthesia Care | Site: Chest

## 2019-03-17 MED ORDER — SODIUM CHLORIDE 0.9% FLUSH: 3.0000 mL | INTRAVENOUS | Status: DC | PRN

## 2019-03-17 MED ORDER — CHLORHEXIDINE GLUCONATE 4 % EX LIQD
Freq: Once | CUTANEOUS | Status: AC
  Administered 2019-03-17: 08:00:00 via TOPICAL
  Filled 2019-03-17: qty 30

## 2019-03-17 MED ORDER — PHENYLEPHRINE HCL-NACL 20-0.9 MG/250ML-% IV SOLN: 0.0000 ug/min | INTRAVENOUS | Status: DC

## 2019-03-17 MED ORDER — SODIUM CHLORIDE 0.9 % IV SOLN
INTRAVENOUS | Status: AC
  Filled 2019-03-17 (×2): qty 1.2

## 2019-03-17 MED ORDER — NITROGLYCERIN IN D5W 200-5 MCG/ML-% IV SOLN: 0.0000 ug/min | INTRAVENOUS | Status: DC

## 2019-03-17 MED ORDER — ACETAMINOPHEN 325 MG PO TABS
650.0000 mg | ORAL_TABLET | Freq: Four times a day (QID) | ORAL | Status: DC | PRN
  Administered 2019-03-20: 650 mg via ORAL
  Filled 2019-03-17: qty 2

## 2019-03-17 MED ORDER — SODIUM CHLORIDE 0.9 % IV SOLN
1.5000 g | Freq: Two times a day (BID) | INTRAVENOUS | Status: AC
  Administered 2019-03-17 – 2019-03-19 (×4): 1.5 g via INTRAVENOUS
  Filled 2019-03-17 (×5): qty 1.5

## 2019-03-17 MED ORDER — ONDANSETRON HCL 4 MG/2ML IJ SOLN: 4.0000 mg | Freq: Four times a day (QID) | INTRAMUSCULAR | Status: DC | PRN

## 2019-03-17 MED ORDER — GLYCOPYRROLATE PF 0.2 MG/ML IJ SOSY
PREFILLED_SYRINGE | INTRAMUSCULAR | Status: AC
  Filled 2019-03-17: qty 1

## 2019-03-17 MED ORDER — VANCOMYCIN HCL IN DEXTROSE 1-5 GM/200ML-% IV SOLN
1000.0000 mg | Freq: Once | INTRAVENOUS | Status: AC
  Administered 2019-03-17: 1000 mg via INTRAVENOUS
  Filled 2019-03-17: qty 200

## 2019-03-17 MED ORDER — SODIUM CHLORIDE 0.9 % IV SOLN
INTRAVENOUS | Status: DC
  Administered 2019-03-17: 10:00:00 via INTRAVENOUS

## 2019-03-17 MED ORDER — SODIUM CHLORIDE 0.9 % IV SOLN
INTRAVENOUS | Status: DC | PRN
  Administered 2019-03-17: 1500 mL

## 2019-03-17 MED ORDER — LIDOCAINE HCL (PF) 1 % IJ SOLN
INTRAMUSCULAR | Status: AC
  Filled 2019-03-17: qty 30

## 2019-03-17 MED ORDER — PROTAMINE SULFATE 10 MG/ML IV SOLN
INTRAVENOUS | Status: DC | PRN
  Administered 2019-03-17: 70 mg via INTRAVENOUS

## 2019-03-17 MED ORDER — MORPHINE SULFATE (PF) 2 MG/ML IV SOLN: 1.0000 mg | INTRAVENOUS | Status: DC | PRN

## 2019-03-17 MED ORDER — GLYCOPYRROLATE 0.2 MG/ML IJ SOLN
INTRAMUSCULAR | Status: DC | PRN
  Administered 2019-03-17: 0.2 mg via INTRAVENOUS

## 2019-03-17 MED ORDER — IODIXANOL 320 MG/ML IV SOLN
INTRAVENOUS | Status: DC | PRN
  Administered 2019-03-17: 80 mL via INTRA_ARTERIAL

## 2019-03-17 MED ORDER — TRAMADOL HCL 50 MG PO TABS: 50.0000 mg | ORAL_TABLET | ORAL | Status: DC | PRN

## 2019-03-17 MED ORDER — SODIUM CHLORIDE 0.9 % IV SOLN
INTRAVENOUS | Status: DC | PRN
  Administered 2019-03-17: 50 ug/min via INTRAVENOUS

## 2019-03-17 MED ORDER — SODIUM CHLORIDE 0.9% FLUSH
3.0000 mL | Freq: Two times a day (BID) | INTRAVENOUS | Status: DC
  Administered 2019-03-17 – 2019-03-23 (×9): 3 mL via INTRAVENOUS

## 2019-03-17 MED ORDER — ACETAMINOPHEN 650 MG RE SUPP: 650.0000 mg | Freq: Four times a day (QID) | RECTAL | Status: DC | PRN

## 2019-03-17 MED ORDER — SODIUM CHLORIDE 0.9 % IV SOLN: 250.0000 mL | INTRAVENOUS | Status: DC | PRN

## 2019-03-17 MED ORDER — HEPARIN SODIUM (PORCINE) 1000 UNIT/ML IJ SOLN
INTRAMUSCULAR | Status: DC | PRN
  Administered 2019-03-17: 7000 [IU] via INTRAVENOUS

## 2019-03-17 MED ORDER — LIDOCAINE HCL 1 % IJ SOLN
INTRAMUSCULAR | Status: DC | PRN
  Administered 2019-03-17: 17 mL

## 2019-03-17 MED ORDER — SODIUM CHLORIDE 0.9 % IV SOLN: INTRAVENOUS | Status: DC | PRN

## 2019-03-17 MED ORDER — ASPIRIN 81 MG PO CHEW
81.0000 mg | CHEWABLE_TABLET | Freq: Every day | ORAL | Status: DC
  Administered 2019-03-18 – 2019-03-23 (×6): 81 mg via ORAL
  Filled 2019-03-17 (×6): qty 1

## 2019-03-17 MED ORDER — PROPOFOL 500 MG/50ML IV EMUL
INTRAVENOUS | Status: DC | PRN
  Administered 2019-03-17: 15 ug/kg/min via INTRAVENOUS

## 2019-03-17 MED ORDER — SODIUM CHLORIDE 0.9 % IV SOLN: INTRAVENOUS | Status: AC

## 2019-03-17 MED ORDER — METOPROLOL TARTRATE 5 MG/5ML IV SOLN: 2.5000 mg | INTRAVENOUS | Status: DC | PRN

## 2019-03-17 MED ORDER — CLOPIDOGREL BISULFATE 75 MG PO TABS
75.0000 mg | ORAL_TABLET | Freq: Every day | ORAL | Status: DC
  Administered 2019-03-18 – 2019-03-23 (×6): 75 mg via ORAL
  Filled 2019-03-17 (×6): qty 1

## 2019-03-17 MED ORDER — OXYCODONE HCL 5 MG PO TABS: 5.0000 mg | ORAL_TABLET | ORAL | Status: DC | PRN

## 2019-03-17 SURGICAL SUPPLY — 84 items
BAG DECANTER FOR FLEXI CONT (MISCELLANEOUS) IMPLANT
BAG SNAP BAND KOVER 36X36 (MISCELLANEOUS) ×4 IMPLANT
BLADE CLIPPER SURG (BLADE) IMPLANT
BLADE OSCILLATING /SAGITTAL (BLADE) IMPLANT
BLADE STERNUM SYSTEM 6 (BLADE) IMPLANT
BLADE SURG 10 STRL SS (BLADE) IMPLANT
CABLE ADAPT CONN TEMP 6FT (ADAPTER) ×4 IMPLANT
CATH BEACON 5 .035 65 KMP TIP (CATHETERS) ×2 IMPLANT
CATH DIAG EXPO 6F VENT PIG 145 (CATHETERS) ×8 IMPLANT
CATH EXPO 5FR AL1 (CATHETERS) IMPLANT
CATH EXTERNAL FEMALE PUREWICK (CATHETERS) IMPLANT
CATH INFINITI 6F AL2 (CATHETERS) IMPLANT
CATH INFINITI 6F MPB2 (CATHETERS) ×2 IMPLANT
CATH S G BIP PACING (CATHETERS) ×4 IMPLANT
CHLORAPREP W/TINT 26 (MISCELLANEOUS) ×4 IMPLANT
CLIP VESOCCLUDE MED 24/CT (CLIP) IMPLANT
CLIP VESOCCLUDE SM WIDE 24/CT (CLIP) IMPLANT
CONT SPEC 4OZ CLIKSEAL STRL BL (MISCELLANEOUS) ×8 IMPLANT
COVER BACK TABLE 80X110 HD (DRAPES) ×4 IMPLANT
COVER WAND RF STERILE (DRAPES) ×4 IMPLANT
DECANTER SPIKE VIAL GLASS SM (MISCELLANEOUS) ×4 IMPLANT
DERMABOND ADVANCED (GAUZE/BANDAGES/DRESSINGS) ×2
DERMABOND ADVANCED .7 DNX12 (GAUZE/BANDAGES/DRESSINGS) ×2 IMPLANT
DEVICE CLOSURE PERCLS PRGLD 6F (VASCULAR PRODUCTS) ×4 IMPLANT
DRAPE INCISE IOBAN 66X45 STRL (DRAPES) IMPLANT
DRSG TEGADERM 4X4.75 (GAUZE/BANDAGES/DRESSINGS) ×8 IMPLANT
ELECT CAUTERY BLADE 6.4 (BLADE) IMPLANT
ELECT REM PT RETURN 9FT ADLT (ELECTROSURGICAL) ×8
ELECTRODE REM PT RTRN 9FT ADLT (ELECTROSURGICAL) ×4 IMPLANT
FELT TEFLON 6X6 (MISCELLANEOUS) IMPLANT
GAUZE SPONGE 4X4 12PLY STRL (GAUZE/BANDAGES/DRESSINGS) ×4 IMPLANT
GLOVE BIO SURGEON STRL SZ7.5 (GLOVE) ×4 IMPLANT
GLOVE BIO SURGEON STRL SZ8 (GLOVE) IMPLANT
GLOVE EUDERMIC 7 POWDERFREE (GLOVE) IMPLANT
GLOVE ORTHO TXT STRL SZ7.5 (GLOVE) IMPLANT
GOWN STRL REUS W/ TWL LRG LVL3 (GOWN DISPOSABLE) IMPLANT
GOWN STRL REUS W/ TWL XL LVL3 (GOWN DISPOSABLE) ×2 IMPLANT
GOWN STRL REUS W/TWL LRG LVL3 (GOWN DISPOSABLE)
GOWN STRL REUS W/TWL XL LVL3 (GOWN DISPOSABLE) ×2
GUIDEWIRE SAFE TJ AMPLATZ EXST (WIRE) ×4 IMPLANT
GUIDEWIRE STRAIGHT .035 260CM (WIRE) ×4 IMPLANT
INSERT FOGARTY SM (MISCELLANEOUS) IMPLANT
KIT BASIN OR (CUSTOM PROCEDURE TRAY) ×4 IMPLANT
KIT HEART LEFT (KITS) ×4 IMPLANT
KIT SUCTION CATH 14FR (SUCTIONS) IMPLANT
KIT TURNOVER KIT B (KITS) ×4 IMPLANT
LOOP VESSEL MAXI BLUE (MISCELLANEOUS) IMPLANT
LOOP VESSEL MINI RED (MISCELLANEOUS) IMPLANT
NS IRRIG 1000ML POUR BTL (IV SOLUTION) ×4 IMPLANT
PACK ENDO MINOR (CUSTOM PROCEDURE TRAY) ×4 IMPLANT
PAD ARMBOARD 7.5X6 YLW CONV (MISCELLANEOUS) ×8 IMPLANT
PAD ELECT DEFIB RADIOL ZOLL (MISCELLANEOUS) ×4 IMPLANT
PENCIL BUTTON HOLSTER BLD 10FT (ELECTRODE) IMPLANT
PERCLOSE PROGLIDE 6F (VASCULAR PRODUCTS) ×12
POSITIONER HEAD DONUT 9IN (MISCELLANEOUS) ×4 IMPLANT
SET MICROPUNCTURE 5F STIFF (MISCELLANEOUS) ×4 IMPLANT
SHEATH BRITE TIP 7FR 35CM (SHEATH) ×4 IMPLANT
SHEATH PINNACLE 6F 10CM (SHEATH) ×4 IMPLANT
SHEATH PINNACLE 8F 10CM (SHEATH) ×4 IMPLANT
SLEEVE REPOSITIONING LENGTH 30 (MISCELLANEOUS) ×4 IMPLANT
STOPCOCK MORSE 400PSI 3WAY (MISCELLANEOUS) ×10 IMPLANT
SUT ETHIBOND X763 2 0 SH 1 (SUTURE) IMPLANT
SUT GORETEX CV 4 TH 22 36 (SUTURE) IMPLANT
SUT GORETEX CV4 TH-18 (SUTURE) IMPLANT
SUT MNCRL AB 3-0 PS2 18 (SUTURE) IMPLANT
SUT PROLENE 5 0 C 1 36 (SUTURE) IMPLANT
SUT PROLENE 6 0 C 1 30 (SUTURE) IMPLANT
SUT SILK  1 MH (SUTURE) ×2
SUT SILK 1 MH (SUTURE) ×2 IMPLANT
SUT VIC AB 2-0 CT1 27 (SUTURE)
SUT VIC AB 2-0 CT1 TAPERPNT 27 (SUTURE) IMPLANT
SUT VIC AB 2-0 CTX 36 (SUTURE) IMPLANT
SUT VIC AB 3-0 SH 8-18 (SUTURE) IMPLANT
SYR 50ML LL SCALE MARK (SYRINGE) ×4 IMPLANT
SYR BULB IRRIGATION 50ML (SYRINGE) IMPLANT
SYR MEDRAD MARK V 150ML (SYRINGE) ×4 IMPLANT
TAPE CLOTH SURG 4X10 WHT LF (GAUZE/BANDAGES/DRESSINGS) ×2 IMPLANT
TOWEL GREEN STERILE (TOWEL DISPOSABLE) ×8 IMPLANT
TRANSDUCER W/STOPCOCK (MISCELLANEOUS) ×8 IMPLANT
TRAY FOLEY SLVR 16FR TEMP STAT (SET/KITS/TRAYS/PACK) IMPLANT
URINAL MALE W/LID DISP 1000CC (MISCELLANEOUS) IMPLANT
VALVE HEART TRANSCATH SZ3 29MM (Valve) ×2 IMPLANT
WIRE .035 3MM-J 145CM (WIRE) ×4 IMPLANT
WIRE LUNDERQUIST .035X180CM (WIRE) ×2 IMPLANT

## 2019-03-17 NOTE — Anesthesia Postprocedure Evaluation (Signed)
Anesthesia Post Note  Patient: Dan Perez  Procedure(s) Performed: TRANSCATHETER AORTIC VALVE REPLACEMENT, TRANSFEMORAL (N/A Chest) Intraoperative Transthoracic Echocardiogram (N/A Chest)     Patient location during evaluation: PACU Anesthesia Type: MAC Level of consciousness: awake and alert Pain management: pain level controlled Vital Signs Assessment: post-procedure vital signs reviewed and stable Respiratory status: spontaneous breathing, nonlabored ventilation, respiratory function stable and patient connected to nasal cannula oxygen Cardiovascular status: stable and blood pressure returned to baseline Postop Assessment: no apparent nausea or vomiting Anesthetic complications: no    Last Vitals:  Vitals:   03/17/19 1600 03/17/19 1615  BP: (!) 141/77 (!) 145/75  Pulse: (!) 57 (!) 51  Resp: (!) 22 16  Temp:    SpO2: 100% 98%    Last Pain:  Vitals:   03/17/19 1546  TempSrc:   PainSc: 0-No pain                 Mikia Delaluz DAVID

## 2019-03-17 NOTE — Progress Notes (Signed)
  Echocardiogram 2D Echocardiogram has been performed.  Dan Perez 03/17/2019, 1:14 PM

## 2019-03-17 NOTE — Progress Notes (Signed)
Legal guardian's last name changed from [facility] to [employer]. Dan Perez formally [place] give consent.

## 2019-03-17 NOTE — Interval H&P Note (Signed)
History and Physical Interval Note:  03/17/2019 10:36 AM  Dan Perez  has presented today for surgery, with the diagnosis of Severe Aortic Stenosis.  The various methods of treatment have been discussed with the patient and family. After consideration of risks, benefits and other options for treatment, the patient has consented to  Procedure(s): TRANSCATHETER AORTIC VALVE REPLACEMENT, TRANSFEMORAL (N/A) Intraoperative Transthoracic Echocardiogram (N/A) as a surgical intervention.  The patient's history has been reviewed, patient examined, no change in status, stable for surgery.  I have reviewed the patient's chart and labs.  Questions were answered to the patient's satisfaction.     Lauree Chandler

## 2019-03-17 NOTE — Progress Notes (Signed)
Patient arrived from cath lab to 4e20. Patient with rt groin "puffy" consistent with report. Lt groin level 0.  Patient vital signs obtained and ccmd made aware. Will monitor patient. Lacie Landry, Bettina Gavia rN

## 2019-03-17 NOTE — Transfer of Care (Signed)
Immediate Anesthesia Transfer of Care Note  Patient: Dan Perez  Procedure(s) Performed: TRANSCATHETER AORTIC VALVE REPLACEMENT, TRANSFEMORAL (N/A Chest) Intraoperative Transthoracic Echocardiogram (N/A Chest)  Patient Location: Cath Lab  Anesthesia Type:MAC  Level of Consciousness: awake  Airway & Oxygen Therapy: Patient Spontanous Breathing and Patient connected to face mask oxygen  Post-op Assessment: Report given to RN  Post vital signs: Reviewed and stable  Last Vitals:  Vitals Value Taken Time  BP 184/83 03/17/19 1349  Temp 36.3 C 03/17/19 1337  Pulse 49 03/17/19 1349  Resp 18 03/17/19 1349  SpO2 100 % 03/17/19 1349  Vitals shown include unvalidated device data.  Last Pain:  Vitals:   03/17/19 1337  TempSrc: Temporal  PainSc: Asleep      Patients Stated Pain Goal: 1 (67/34/19 3790)  Complications: No apparent anesthesia complications

## 2019-03-17 NOTE — Progress Notes (Signed)
Facetime attempted with POA. No connection. Voicemail left to [place], regarding patient leaving for surgery this morning.

## 2019-03-17 NOTE — Progress Notes (Signed)
Dr. Angelena Form notified of labs ok to proceed.

## 2019-03-17 NOTE — Progress Notes (Signed)
Cardiology Progress Note:   I was called by the nursing staff in the Short Stay area this am. The patient's daughter who is the Power of Oneta Rack was not the one who Dr. Roxy Manns spoke to yesterday. I called his daughter Dan Perez this am and reviewed the procedure in detail including risks and potential alternative access. She was in agreement to proceed. She gave verbal consent for the procedure to the nursing staff on Short Stay.   Dan Perez 03/17/2019 10:36 AM

## 2019-03-17 NOTE — Progress Notes (Signed)
Spoke to Dr. Angelena Form regarding consent. He will call patient's legal guardian on arrival.

## 2019-03-17 NOTE — Anesthesia Preprocedure Evaluation (Signed)
Anesthesia Evaluation  Patient identified by MRN, date of birth, ID band Patient awake    Reviewed: Allergy & Precautions, NPO status , Patient's Chart, lab work & pertinent test results  Airway Mallampati: I  TM Distance: >3 FB Neck ROM: Full    Dental   Pulmonary former smoker,    Pulmonary exam normal        Cardiovascular hypertension, Pt. on medications Normal cardiovascular exam+ Valvular Problems/Murmurs AS      Neuro/Psych Dementia    GI/Hepatic   Endo/Other    Renal/GU Renal InsufficiencyRenal disease     Musculoskeletal   Abdominal   Peds  Hematology   Anesthesia Other Findings   Reproductive/Obstetrics                             Anesthesia Physical Anesthesia Plan  ASA: III  Anesthesia Plan: MAC   Post-op Pain Management:    Induction:   PONV Risk Score and Plan: 1  Airway Management Planned: Simple Face Mask  Additional Equipment:   Intra-op Plan:   Post-operative Plan:   Informed Consent: I have reviewed the patients History and Physical, chart, labs and discussed the procedure including the risks, benefits and alternatives for the proposed anesthesia with the patient or authorized representative who has indicated his/her understanding and acceptance.       Plan Discussed with: CRNA and Surgeon  Anesthesia Plan Comments:         Anesthesia Quick Evaluation

## 2019-03-17 NOTE — CV Procedure (Signed)
[facility]  TAVR OPERATIVE NOTE   Date of Procedure:  03/17/2019  Preoperative Diagnosis: Severe Aortic Stenosis   Postoperative Diagnosis: Same   Procedure:    Transcatheter Aortic Valve Replacement - Transfemoral Approach  Edwards Sapien 3 THV (size 29 mm, model # [id number], serial # [id number])   Co-Surgeons:  Lauree Chandler, MD and Valentina Gu. Roxy Manns, MD   Anesthesiologist:  Conrad Kenmar  Echocardiographer:  Croitoru  Pre-operative Echo Findings:  Severe aortic stenosis  Normal left ventricular systolic function  Post-operative Echo Findings:  No paravalvular leak  Normal left ventricular systolic function  BRIEF CLINICAL NOTE AND INDICATIONS FOR SURGERY     Dan Perez is a 85 y.o. male with a hx of pectus excavatum, scoliosis, HTN, AAA, CKD stage III, anemia, delusion disorder, dementia, thyroidectomy w/ iatrogenic hypothyroidism, moderate MR, mild CAD and severe aortic stenosis who is here today for TAVR. He was admitted with syncope felt to be due to his severe AS. Cardiac cath with mild CAD.   During the course of the patient's preoperative work up they have been evaluated comprehensively by a multidisciplinary team of specialists coordinated through the [facility] in the [facility].  They have been demonstrated to suffer from symptomatic severe aortic stenosis as noted above. The patient has been counseled extensively as to the relative risks and benefits of all options for the treatment of severe aortic stenosis including long term medical therapy, conventional surgery for aortic valve replacement, and transcatheter aortic valve replacement.  The patient has been independently evaluated by Dr. Roxy Manns with CT surgery and they are felt to be at high risk for conventional surgical aortic valve replacement. The surgeon indicated the patient would be a poor candidate for conventional surgery. Based upon  review of all of the patient's preoperative diagnostic tests they are felt to be candidate for transcatheter aortic valve replacement using the transfemoral approach as an alternative to high risk conventional surgery.    Following the decision to proceed with transcatheter aortic valve replacement, a discussion has been held regarding what types of management strategies would be attempted intraoperatively in the event of life-threatening complications, including whether or not the patient would be considered a candidate for the use of cardiopulmonary bypass and/or conversion to open sternotomy for attempted surgical intervention.  The patient has been advised of a variety of complications that might develop peculiar to this approach including but not limited to risks of death, stroke, paravalvular leak, aortic dissection or other major vascular complications, aortic annulus rupture, device embolization, cardiac rupture or perforation, acute myocardial infarction, arrhythmia, heart block or bradycardia requiring permanent pacemaker placement, congestive heart failure, respiratory failure, renal failure, pneumonia, infection, other late complications related to structural valve deterioration or migration, or other complications that might ultimately cause a temporary or permanent loss of functional independence or other long term morbidity.  The patient provides full informed consent for the procedure as described and all questions were answered preoperatively.    DETAILS OF THE OPERATIVE PROCEDURE  PREPARATION:   The patient is brought to the operating room on the above mentioned date and central monitoring was established by the anesthesia team including placement of a radial arterial line. The patient is placed in the supine position on the operating table.  Intravenous antibiotics are administered. Conscious sedation is used.   Baseline transthoracic echocardiogram was performed. The patient's chest,  abdomen, both groins, and both lower extremities are prepared and draped in  a sterile manner. A time out procedure is performed.   PERIPHERAL ACCESS:   Using the modified Seldinger technique, femoral arterial and venous access were obtained with placement of 6 Fr sheaths on the right side using u/s guidance.  A pigtail diagnostic catheter was passed through the femoral arterial sheath under fluoroscopic guidance into the aortic root.  A temporary transvenous pacemaker catheter was passed through the femoral venous sheath under fluoroscopic guidance into the right ventricle.  The pacemaker was tested to ensure stable lead placement and pacemaker capture. Aortic root angiography was performed in order to determine the optimal angiographic angle for valve deployment.  TRANSFEMORAL ACCESS:  A micropuncture kit was used to gain access to the left femoral artery. Position confirmed with angiography. Pre-closure with double ProGlide closure devices. The patient was heparinized systemically and ACT verified > 250 seconds.    A 16 Fr transfemoral E-sheath was introduced into the femoral artery after progressively dilating over a Lunderquist wire. An AL-2 catheter was used to direct a straight-tip exchange length wire across the native aortic valve into the left ventricle. This was exchanged out for a pigtail catheter and position was confirmed in the LV apex. Simultaneous LV and Ao pressures were recorded.  The pigtail catheter was then exchanged for an Amplatz Extra-stiff wire in the LV apex.   TRANSCATHETER HEART VALVE DEPLOYMENT:  An Edwards Sapien 3 THV (size 29 mm) was prepared and crimped per manufacturer's guidelines, and the proper orientation of the valve is confirmed on the [employer] delivery system. The valve was advanced through the introducer sheath using normal technique until in an appropriate position in the abdominal aorta beyond the sheath tip. The balloon was then retracted and using  the fine-tuning wheel was centered on the valve. The valve was then advanced across the aortic arch using appropriate flexion of the catheter. The valve was carefully positioned across the aortic valve annulus. The Commander catheter was retracted using normal technique. Once final position of the valve has been confirmed by angiographic assessment, the valve is deployed while temporarily holding ventilation and during rapid ventricular pacing to maintain systolic blood pressure < 50 mmHg and pulse pressure < 10 mmHg. The balloon inflation is held for >3 seconds after reaching full deployment volume. Once the balloon has fully deflated the balloon is retracted into the ascending aorta and valve function is assessed using TTE. There is felt to be no paravalvular leak and no central aortic insufficiency.  The patient's hemodynamic recovery following valve deployment is good.  The deployment balloon and guidewire are both removed. Echo demostrated acceptable post-procedural gradients, stable mitral valve function, and no AI.   PROCEDURE COMPLETION:  The sheath was then removed and closure devices were completed. Protamine was administered once femoral arterial repair was complete. The temporary pacemaker, pigtail catheters and femoral sheaths were removed with manual pressure used for hemostasis.   The patient tolerated the procedure well and is transported to the surgical intensive care in stable condition. There were no immediate intraoperative complications. All sponge instrument and needle counts are verified correct at completion of the operation.   No blood products were administered during the operation.  The patient received a total of 80 mL of intravenous contrast during the procedure.  Lauree Chandler MD 03/17/2019 1:25 PM

## 2019-03-17 NOTE — Progress Notes (Signed)
Patient daughter [name] updated on patient all questions answered. Sabeen Piechocki, Bettina Gavia RN

## 2019-03-17 NOTE — OR Nursing (Signed)
Right groin venous and arterial sheaths pulled in cath lab holding area. Pressure held for 20 minutes. Level 1, quarter sized hematoma present. Dressing placed.

## 2019-03-17 NOTE — Progress Notes (Signed)
POA, [name] called back to unit. Made aware that pt will be going to surgery this morning. [name] states that she spoke with pt over the phone this morning. Ok to proceed to surgery.

## 2019-03-17 NOTE — Progress Notes (Signed)
Rt radial arterial line d/c'ed, 5 minutes manual pressure. Dressed w/gauze and tegaderm. Palpable rt radial. Rt hand warm and pink

## 2019-03-17 NOTE — Op Note (Signed)
[facility]   MULTIDISCIPLINARY HEART VALVE TEAM   TAVR OPERATIVE NOTE   Date of Procedure:  03/17/2019  Preoperative Diagnosis: Severe Aortic Stenosis   Postoperative Diagnosis: Same   Procedure:    Transcatheter Aortic Valve Replacement - Percutaneous Left Transfemoral Approach  Edwards Sapien 3 THV (size 29 mm, model # 9600TFX, serial # [id number])   Co-Surgeons:  Verne Carrowhristopher McAlhany, MD and Salvatore Decentlarence H. Cornelius Moraswen, MD  Anesthesiologist:  Arta BruceKevin Ossey, MD  Echocardiographer:  Thurmon FairMihai Croitoru, MD  Pre-operative Echo Findings:  Severe aortic stenosis  Normal left ventricular systolic function  Post-operative Echo Findings:  No paravalvular leak  Normal left ventricular systolic function   BRIEF CLINICAL NOTE AND INDICATIONS FOR SURGERY  Patient is an 85 year old male with history of aortic stenosis, pectus excavatum with severe scoliosis, hypertension, abdominal aortic aneurysm, stage III chronic kidney disease, anemia, delusional disorder, iatrogenic hypothyroidism following thyroidectomy, and mild dementia who has been referred for surgical consultation to discuss treatment options for management of severe symptomatic aortic stenosis.  Patient states that he was first told he had a heart murmur in the 1950s when he underwent physical examination at the time he joined the [employer].  In 2015 he underwent transthoracic echocardiogram and was referred for preoperative cardiac clearance prior to thyroidectomy because presence of a heart murmur.  Echo at that time revealed mild aortic stenosis with normal left ventricular systolic function.  In 2018 he was referred to Dr. Purvis SheffieldKoneswaran for evaluation by his primary care physician to establish long-term follow-up for aortic stenosis.  Echocardiogram performed at that time revealed normal left ventricular systolic function with moderate aortic stenosis and moderate aortic insufficiency.  Continued follow-up was  recommended.  In 2019 the patient was elevated evaluated in the emergency department for atypical chest pain in the setting of severe hypertension.  Troponins were normal.  He was mildly anemic.  He was prescribed tramadol for presumed musculoskeletal pain.  Routine follow-up echocardiogram performed May 2020 revealed further progression of disease with severe aortic stenosis.  Peak velocity across the aortic valve was reported 3.2 m/s corresponding to mean transvalvular gradient 24 mmHg, but the DVI was notably quite low at 0.23, stroke-volume index 31.93, and aortic valve area calculated 0.75 cm, consistent with paradoxical low gradient with preserved ejection fraction severe aortic stenosis.  The patient was evaluated by Dr. Purvis SheffieldKoneswaran on February 24, 2019 and reported worsening exertional shortness of breath as well as occasional spells of "weakness and paleness" per the patient's daughters recollection.  On March 06, 2019 the patient suffered a syncopal event and fell down striking his head, resulting in a laceration and contusion of the left side of his face.  [facility] where he was found to be in sinus rhythm.  Troponin was mildly elevated 0.02.  Head CT revealed no intracranial abnormality.  His facial laceration was sutured closed.  He was notably hypertensive at the time.  [facility] where he ruled out for acute myocardial infarction.  He was evaluated by Dr. Clifton JamesMcAlhany and underwent diagnostic cardiac catheterization on March 10, 2019.  Catheterization revealed mild nonobstructive coronary artery disease.  Attempts to cross the aortic valve were unsuccessful.  CT angiography was performed and cardiothoracic surgical consultation requested.  During the course of the patient's preoperative work up they have been evaluated comprehensively by a multidisciplinary team of specialists coordinated through the Multidisciplinary Heart Valve  Clinic in the  [facility].  They have been demonstrated to suffer from symptomatic severe aortic stenosis as noted above. The patient has been counseled extensively as to the relative risks and benefits of all options for the treatment of severe aortic stenosis including long term medical therapy, conventional surgery for aortic valve replacement, and transcatheter aortic valve replacement.  All questions have been answered, and the patient provides full informed consent for the operation as described.   DETAILS OF THE OPERATIVE PROCEDURE  PREPARATION:    The patient is brought to the operating room on the above mentioned date and appropriate monitoring was established by the anesthesia team. The patient is placed in the supine position on the operating table.  Intravenous antibiotics are administered. The patient is monitored closely throughout the procedure under conscious sedation.  Baseline transthoracic echocardiogram was performed. The patient's chest, abdomen, both groins, and both lower extremities are prepared and draped in a sterile manner. A time out procedure is performed.   PERIPHERAL ACCESS:    Using the modified Seldinger technique, femoral arterial and venous access was obtained with placement of 6 Fr sheaths on the right side.  A pigtail diagnostic catheter was passed through the right arterial sheath under fluoroscopic guidance into the aortic root.  A temporary transvenous pacemaker catheter was passed through the right femoral venous sheath under fluoroscopic guidance into the right ventricle.  The pacemaker was tested to ensure stable lead placement and pacemaker capture. Aortic root angiography was performed in order to determine the optimal angiographic angle for valve deployment.   TRANSFEMORAL ACCESS:   Percutaneous transfemoral access and sheath placement was performed using ultrasound guidance.  The left common femoral artery was cannulated  using a micropuncture needle and appropriate location was verified using hand injection angiogram.  A pair of Abbott Perclose percutaneous closure devices were placed and a 6 French sheath replaced into the femoral artery.  The patient was heparinized systemically and ACT verified > 250 seconds.    A 16 Fr transfemoral E-sheath was introduced into the left common femoral artery after progressively dilating over a Lunderquist wire. An AL-2 catheter was used to direct a straight-tip exchange length wire across the native aortic valve into the left ventricle. This was exchanged out for a pigtail catheter and position was confirmed in the LV apex. Simultaneous LV and Ao pressures were recorded.  The pigtail catheter was exchanged for an Amplatz Extra-stiff wire in the LV apex.  Echocardiography was utilized to confirm appropriate wire position and no sign of entanglement in the mitral subvalvular apparatus.   BALLOON AORTIC VALVULOPLASTY:   Balloon aortic valvuloplasty was performed using a 25 mm valvuloplasty balloon.  Once optimal position was achieved, BAV was done under rapid ventricular pacing. The patient recovered well hemodynamically.    TRANSCATHETER HEART VALVE DEPLOYMENT:   An Edwards Sapien 3 transcatheter heart valve (size 29 mm, model #9600TFX, serial #[id number]) was prepared and crimped per manufacturer's guidelines, and the proper orientation of the valve is confirmed on the [employer] delivery system. The valve was advanced through the introducer sheath using normal technique until in an appropriate position in the abdominal aorta beyond the sheath tip. The balloon was then retracted and using the fine-tuning wheel was centered on the valve. The valve was then advanced across the aortic arch using appropriate flexion of the catheter. The valve was carefully positioned across the aortic valve annulus. The Commander catheter was retracted using normal technique. Once final position of the  valve has been confirmed by angiographic assessment, the valve is deployed while temporarily holding ventilation and during rapid ventricular pacing to maintain systolic blood pressure < 50 mmHg and pulse pressure < 10 mmHg. The balloon inflation is held for >3 seconds after reaching full deployment volume. Once the balloon has fully deflated the balloon is retracted into the ascending aorta and valve function is assessed using echocardiography. There is felt to be no paravalvular leak and no central aortic insufficiency.  The patient's hemodynamic recovery following valve deployment is good.  The deployment balloon and guidewire are both removed.    PROCEDURE COMPLETION:   The sheath was removed and femoral artery closure performed.  Protamine was administered once femoral arterial repair was complete. The temporary pacemaker, pigtail catheters and femoral sheaths were removed with manual pressure used for hemostasis.  A Mynx femoral closure device was utilized following removal of the diagnostic sheath in the right femoral artery.  The patient tolerated the procedure well and is transported to the surgical intensive care in stable condition. There were no immediate intraoperative complications. All sponge instrument and needle counts are verified correct at completion of the operation.   No blood products were administered during the operation.  The patient received a total of 80 mL of intravenous contrast during the procedure.   Purcell Nailslarence H Suni Jarnagin, MD 03/17/2019 1:16 PM

## 2019-03-17 NOTE — Anesthesia Procedure Notes (Signed)
Procedure Name: MAC Date/Time: 03/17/2019 11:29 AM Performed by: Barrington Ellison, CRNA Pre-anesthesia Checklist: Patient identified, Emergency Drugs available, Suction available, Patient being monitored and Timeout performed Patient Re-evaluated:Patient Re-evaluated prior to induction Oxygen Delivery Method: Simple face mask

## 2019-03-18 ENCOUNTER — Inpatient Hospital Stay (HOSPITAL_COMMUNITY): Payer: PPO

## 2019-03-18 ENCOUNTER — Encounter (HOSPITAL_COMMUNITY): Payer: Self-pay | Admitting: Cardiovascular Disease

## 2019-03-18 ENCOUNTER — Other Ambulatory Visit: Payer: Self-pay

## 2019-03-18 DIAGNOSIS — Z952 Presence of prosthetic heart valve: Secondary | ICD-10-CM

## 2019-03-18 DIAGNOSIS — N183 Chronic kidney disease, stage 3 unspecified: Secondary | ICD-10-CM

## 2019-03-18 DIAGNOSIS — I350 Nonrheumatic aortic (valve) stenosis: Secondary | ICD-10-CM

## 2019-03-18 LAB — ECHOCARDIOGRAM COMPLETE
Height: 72 in
Weight: 2313.95 oz

## 2019-03-18 LAB — CBC
HCT: 32.7 % — ABNORMAL LOW (ref 39.0–52.0)
Hemoglobin: 10.6 g/dL — ABNORMAL LOW (ref 13.0–17.0)
MCH: 29 pg (ref 26.0–34.0)
MCHC: 32.4 g/dL (ref 30.0–36.0)
MCV: 89.6 fL (ref 80.0–100.0)
Platelets: 175 10*3/uL (ref 150–400)
RBC: 3.65 MIL/uL — ABNORMAL LOW (ref 4.22–5.81)
RDW: 13.6 % (ref 11.5–15.5)
WBC: 8.2 10*3/uL (ref 4.0–10.5)
nRBC: 0 % (ref 0.0–0.2)

## 2019-03-18 LAB — BASIC METABOLIC PANEL
Anion gap: 12 (ref 5–15)
BUN: 26 mg/dL — ABNORMAL HIGH (ref 8–23)
CO2: 24 mmol/L (ref 22–32)
Calcium: 8.3 mg/dL — ABNORMAL LOW (ref 8.9–10.3)
Chloride: 97 mmol/L — ABNORMAL LOW (ref 98–111)
Creatinine, Ser: 1.21 mg/dL (ref 0.61–1.24)
GFR calc Af Amer: >60 mL/min (ref >60)
GFR calc non Af Amer: 55 mL/min — ABNORMAL LOW (ref >60)
Glucose, Bld: 108 mg/dL — ABNORMAL HIGH (ref 70–99)
Potassium: 4.9 mmol/L (ref 3.5–5.1)
Sodium: 133 mmol/L — ABNORMAL LOW (ref 135–145)

## 2019-03-18 LAB — SARS CORONAVIRUS 2 BY RT PCR (HOSPITAL ORDER, PERFORMED IN ~~LOC~~ HOSPITAL LAB): SARS Coronavirus 2: NEGATIVE

## 2019-03-18 LAB — MAGNESIUM: Magnesium: 2.2 mg/dL (ref 1.7–2.4)

## 2019-03-18 MED ORDER — ASPIRIN 81 MG PO CHEW: 81.0000 mg | CHEWABLE_TABLET | Freq: Every day | ORAL | 3 refills | Status: AC

## 2019-03-18 MED ORDER — AMLODIPINE BESYLATE 5 MG PO TABS: 5.0000 mg | ORAL_TABLET | Freq: Every day | ORAL | 3 refills | Status: AC

## 2019-03-18 MED ORDER — CLOPIDOGREL BISULFATE 75 MG PO TABS: 75.0000 mg | ORAL_TABLET | Freq: Every day | ORAL | 3 refills | Status: AC

## 2019-03-18 NOTE — Discharge Summary (Signed)
Discharge Summary    Patient ID: Dan BrimLawrence E Slimp,  MRN: [id number], DOB/AGE: Apr 28, 1932 85 y.o.  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  Prox Cx to Mid Cx lesion is 20% stenosed.  Mid LAD lesion is 20% stenosed.   1. Mild non-obstructive CAD 2. Unable to cross into the LV with a diagnostic catheter. The AL-1 was too short given his height and arm length). The long pigtail would not cross the valve given the severe angulation of the horizontal aortic root.   Recommendations: Will continue inpatient workup for TAVR. Will plans CT scans tomorrow if renal function is stable. Dr. Cornelius Moraswen will also see over the next day.   Carotid duplex 03/10/2019: Summary: Right Carotid: Velocities in the right ICA are consistent with a 1-39% stenosis.  Left Carotid: Velocities in the left ICA are consistent with a 1-39% stenosis.  Vertebrals: Bilateral vertebral arteries demonstrate antegrade flow.  Echocardiogram 03/17/2019:  PRE-PROCEDURE:   Normal left ventricular systolic function and regional wall motion. LV ejection fraction 55-60%. The aortic valve is poorly seen, but appears to be trileaflet. It is severely thickened and calcified. There is severe aortic stenosis. Peak gradient 48 mm Hg, mean gradient 27 mm Hg, dimensionless index 0.20, calculated area 0.64 cm sq. No aortic  insufficiency. Mild mitral insufficiency. No pericardial effusion.   POST-PROCEDURE:   Normal left ventricular systolic function and regional wall motion. LV ejection fraction 55-60%. Well deployed [employer] Sapien 3 stent-valve prosthesis. There is trivial perivalvular leak, towards the mitral annulus. Peak gradient 4 mm Hg, mean gradient 2 mm Hg, dimensionless index 0.7, calculated area 2.2 cm sq. Mild mitral insufficiency. No pericardial effusion.   _____________   History of Present Illness     85 yo male with a h/o HTN, progressive/Severe aortic stenosis, stage III chronic kidney disease, moderate mitral regurgitation, dilated abdominal aorta, and coronary calcium noted on prior CT.  Aortic stenosis has been followed over the years with serial echocardiography.  Most recent echo in May of this year, showed progression to severe aortic stenosis with a relatively low mean gradient of 24 mmHg but with an aortic valve area of 0.75 cm.  He was recently evaluated by Dr. Purvis SheffieldKoneswaran on 6/9, b/c his dtr reported that he was having increasing dyspnea along with sudden spells of weakness during which, he would become very pale.  These spells were short lived.  Patient says that he has no recollection of these spells occurring, simply that his daughter tells him that they happen.  In the setting of concern for possible syncope and known severe AS, he was referred for TAVR eval and currently has an appt w/ Dr. Clifton JamesMcAlhany on 7/6.  Of note, he has been markedly hypertensive at home and on office visits, and this has resulted in titration of his ACE inhibitor therapy.  He also takes Lasix.  Patient reported that at home, he typically does pretty well  and ambulates without difficulty.  However, earlier this week, possibly 2 days ago, he had an episode of sudden loss of consciousness while walking, causing him to fall forward.  Fortunately, he did not sustain any trauma.  It is not clear as to how long he was  without consciousness, but he does not think that it was very long.  Unfortunately,he had a reoccurrence on 03/06/2019.  He reported that he drove to a location.  He was only in the car for about 15 minutes.  Shortly after arriving, he got out of his car and when he turned to walk, he had sudden loss of consciousness.  He denies any significant prodromal symptoms.  He says the next thing he was aware of was when he regained consciousness and was on the ground.  He had struck the left side of his face and was bleeding.  In that setting, he was taken to [facility] emergency department where he was found to be in sinus rhythm.  Lab work was relatively unremarkable, though his creatinine was slightly elevated at 1.48 with a BUN of 21.  Troponin was mildly elevated at 0.02.  No EKGs available for review.  Head CT was performed and did not show any acute intracranial abnormality.  Soft tissue hematoma overlying the lower left frontal bone and orbit, maxilla, and zygoma was noted without fracture.  Laceration was treated with suturing.  He was hypertensive with blood pressures in the 160s.  Given history of aortic stenosis, decision was made to transfer to [facility] for further evaluation.  At the time of admission, patient was symptom-free and in sinus rhythm on the monitor.   Hospital Course     Consultants: Structural heart team and cardiothoracic surgery   1. Severe aortic stenosis with syncope: patient presented after syncopal event. Known severe AS noted on prior echo from 01/2019. The structural heart team was consults and recommended consideration for TAVR. He underwent a RHC and coronary angiography 03/10/2019 which showed mild-moderate non-obstructive CAD, though was unable to cross into the LV with a diagnostic catheter. Further work-up included Coronary CTA and CTA C/A/P. TCTS evaluated the patient and deemed him a poor candidate for conventional AVR and agreed with decision to pursue TAVR. Patient underwent TAVR  03/17/2019, tolerating the procedure well without complications. Follow-up echo on the morning of discharge showed normal LV systolic function and mean gradient of 70mmHg across the aoritic valve with no AI (per Dr. Angelena Form). He was recommended to continue aspirin and plavix. No evidence of arrhythmia seen on telemetry this admission to suggest arrhythmogenic cause of syncope.  - Continue aspirin and plavix - Plan for close follow-up with structural heart team next week (apppt with Nell Range 03/25/2019) - Will need repeat echo in ~1 month (scheduled for 04/08/2019).   2. HTN: BP frequently elevated this admission. His home lisinopril and prn lasix were held on admission due to slight bump in Cr. He was started on amlodipine 5mg  daily but remained with SBP persistently greater than 130.  - Continue amlodipine - Will resume home lisinopril and prn lasix at discharge  3. Left-sided facial laceration: patient sustained a laceration to left side of face during syncopal event 03/06/2019. CT head/maxillofacial did not reveal any intracranial hemorrhage or fracture. Laceration repaired with sutures at [facility] 03/06/2019. Sutures removed 03/16/2019.   4. CKD stage 3: Cr monitor closely throughout admission. Peaked at 1.48 ([facility] labs).   5. AAA: CTA A/P this admission showed stable infrarenal AAA measuring 3.7 x  4.0 cm. Recommended for f/u US in 2 years - Continue routine outpatient monitoring  6. Pulmonary nodules: incidental finding on CTA Chest. Recommended for f/u non-contrast CT Chest in 3-6 months for close observation.  - Recommend follow-up with PCP for ongoing monitoring.   PT/OT evaluated patient and felt he was unsafe to go home, recommending SNF. SNF bed available 03/20/2019.  _____________  Discharge Vitals Blood pressure (!) 141/84, pulse 64, temperature 99.1 F (37.3 C), temperature source Oral, resp. rate 17, height 6' (1.829 m), weight 66.2 kg, SpO2 92 %.  Filed Weights   03/18/19 0534  03/19/19 0416 03/20/19 0347  Weight: 65.6 kg 65.2 kg 66.2 kg    Labs & Radiologic Studies    CBC Recent Labs    03/18/19 0419 03/20/19 0038  WBC 8.2 8.5  HGB 10.6* 9.2*  HCT 32.7* 28.6*  MCV 89.6 89.9  PLT 175 157   Basic Metabolic Panel Recent Labs    16/10/96 1349 03/18/19 0419  NA 134* 133*  K 5.0 4.9  CL 99 97*  CO2  --  24  GLUCOSE 102* 108*  BUN 26* 26*  CREATININE 1.00 1.21  CALCIUM  --  8.3*  MG  --  2.2   Liver Function Tests No results for input(s): AST, ALT, ALKPHOS, BILITOT, PROT, ALBUMIN in the last 72 hours. No results for input(s): LIPASE, AMYLASE in the last 72 hours. Cardiac Enzymes No results for input(s): CKTOTAL, CKMB, CKMBINDEX, TROPONINI in the last 72 hours. BNP Invalid input(s): POCBNP D-Dimer No results for input(s): DDIMER in the last 72 hours. Hemoglobin A1C No results for input(s): HGBA1C in the last 72 hours. Fasting Lipid Panel No results for input(s): CHOL, HDL, LDLCALC, TRIG, CHOLHDL, LDLDIRECT in the last 72 hours. Thyroid Function Tests No results for input(s): TSH, T4TOTAL, T3FREE, THYROIDAB in the last 72 hours.  Invalid input(s): FREET3 _____________  Dg Chest 2 View  Result Date: 03/16/2019 CLINICAL DATA:  Preop for aortic stenosis. Ex-smoker. EXAM: CHEST - 2 VIEW COMPARISON:  CT 03/11/2019. Most recent chest radiograph 07/15/2018 FINDINGS: Hyperinflation. Osteopenia with lower thoracic vertebral body height loss, suboptimally evaluated. Artifact degradation posteriorly on the lateral view. Midline trachea. Moderate cardiomegaly. Tortuous thoracic aorta. Atherosclerosis in the transverse aorta. No pleural effusion or pneumothorax. No congestive failure. Numerous leads and wires project over the chest. IMPRESSION: Cardiomegaly and hyperinflation, without acute disease. Aortic Atherosclerosis (ICD10-I70.0). Electronically Signed   By: Jeronimo Greaves M.D.   On: 03/16/2019 20:54   Ct Coronary Morph W/cta Cor W/score W/ca W/cm &/or  Wo/cm  Addendum Date: 03/11/2019   ADDENDUM REPORT: 03/11/2019 12:48 CLINICAL DATA:  85 year old male with severe aortic stenosis being evaluated for a TAVR procedure. EXAM: Cardiac TAVR CT TECHNIQUE: The patient was scanned on a [employer]. A 120 kV retrospective scan was triggered in the descending thoracic aorta at 111 HU's. Gantry rotation speed was 250 msecs and collimation was .6 mm. No beta blockade or nitro were given. The 3D data set was reconstructed in 5% intervals of the R-R cycle. Systolic and diastolic phases were analyzed on a dedicated work station using MPR, MIP and VRT modes. The patient received 80 cc of contrast. FINDINGS: Aortic Valve: Trileaflet aortic valve with severely thickened and calcified leaflets and severely restricted leaflets opening and mild asymmetric calcifications extending into the LVOT under the left coronary sinus. Aorta: Normal size with moderate diffuse atherosclerotic plaque and calcifications. No dissection. Tortuous course of the abdominal aorta. Sinotubular Junction: 33 x 33  mm Ascending Thoracic Aorta: 39 x 38 mm Aortic Arch: 33 x 29 mm Descending Thoracic Aorta: 29 x 27 mm Sinus of Valsalva Measurements: Non-coronary: 37 mm Right -coronary: 36 mm Left -coronary: 36 mm Coronary Artery Height above Annulus: Left Main: 19 mm Right Coronary: 17 mm Virtual Basal Annulus Measurements: Maximum/Minimum Diameter: 30.6 x 25.2 mm Mean Diameter: 26.7 mm Perimeter: 86 mm Area: 558 mm2 Optimum Fluoroscopic Angle for Delivery: RAO 4 CAU 43 IMPRESSION: 1. Trileaflet aortic valve with severely thickened and calcified leaflets and severely restricted leaflets opening and mild asymmetric calcifications extending into the LVOT under the left coronary sinus. Annular measurements suitable for delivery of a 29 mm Edwards-SAPIEN 3 valve. Aortic valve calcium score 5172 consistent with severe aortic stenosis. 2. Sufficient coronary to annulus distance. 3. Optimum Fluoroscopic  Angle for Delivery:  RAO 4 CAU 43. 4. No thrombus in the left atrial appendage. Electronically Signed   By: Tobias AlexanderKatarina  Nelson   On: 03/11/2019 12:48   Result Date: 03/11/2019 EXAM: OVER-READ INTERPRETATION  CT CHEST The following report is an over-read performed by radiologist Dr. Trudie Reedaniel Entrikin of [facility] on 03/11/2019. This over-read does not include interpretation of cardiac or coronary anatomy or pathology. The coronary calcium score/coronary CTA interpretation by the cardiologist is attached. COMPARISON:  Chest CT 01/30/2018. FINDINGS: Extracardiac findings will be described separately under dictation for contemporaneously obtained CTA chest, abdomen and pelvis. IMPRESSION: Please see separate dictation for contemporaneously obtained CTA chest, abdomen and pelvis dated 03/11/2019 for full description of relevant extracardiac findings. Electronically Signed: By: Trudie Reedaniel  Entrikin M.D. On: 03/11/2019 11:30   Dg Chest Port 1 View  Result Date: 03/17/2019 CLINICAL DATA:  Status post TAVR, history of aortic stenosis EXAM: PORTABLE CHEST 1 VIEW COMPARISON:  03/16/2019 FINDINGS: Low volume, rotated AP portable examination interval placement of aortic valve stent endograft, which projects in the vicinity of the aortic valve, although is in an unusual inferiorly tilted orientation, perhaps exaggerated by radiographic technique. Correlate with intraprocedural imaging and echocardiogram for proper placement. Unchanged cardiomegaly, aortic atherosclerosis, and chronic elevation of the left hemidiaphragm. No acute appearing airspace opacity IMPRESSION: Low volume, rotated AP portable examination interval placement of aortic valve stent endograft, which projects in the vicinity of the aortic valve, although is in an unusual inferiorly tilted orientation, perhaps exaggerated by radiographic technique. Correlate with intraprocedural imaging and echocardiogram for proper placement. Unchanged cardiomegaly,  aortic atherosclerosis, and chronic elevation of the left hemidiaphragm. No acute appearing airspace opacity Electronically Signed   By: Lauralyn PrimesAlex  Bibbey M.D.   On: 03/17/2019 16:59   Ct Angio Chest Aorta W/cm &/or Wo/cm  Result Date: 03/11/2019 CLINICAL DATA:  85 year old male with history of severe aortic stenosis. Preprocedural study prior to potential transcatheter aortic valve replacement (TAVR) procedure. EXAM: CT ANGIOGRAPHY CHEST, ABDOMEN AND PELVIS TECHNIQUE: Multidetector CT imaging through the chest, abdomen and pelvis was performed using the standard protocol during bolus administration of intravenous contrast. Multiplanar reconstructed images and MIPs were obtained and reviewed to evaluate the vascular anatomy. CONTRAST:  100mL OMNIPAQUE IOHEXOL 350 MG/ML SOLN COMPARISON:  Chest CT 01/30/2018. CT the abdomen and pelvis 12/03/2016. FINDINGS: CTA CHEST FINDINGS Cardiovascular: Heart size is normal. Small amount of pericardial fluid and/or thickening, unlikely to be of any hemodynamic significance at this time. No associated pericardial calcification. There is aortic atherosclerosis, as well as atherosclerosis of the great vessels of the mediastinum and the coronary arteries, including calcified atherosclerotic plaque in the left main and left anterior descending coronary  arteries. Ectasia of ascending thoracic aorta (4.0 cm in diameter). Severe thickening calcification of the aortic valve. Mediastinum/Lymph Nodes: No pathologically enlarged mediastinal or hilar lymph nodes. Esophagus is unremarkable in appearance. No axillary lymphadenopathy. Lungs/Pleura: Several small pulmonary nodules are noted in the right lung, largest of which is in the periphery of the right lower lobe (axial image 61 of series 17) measuring 10 x 8 mm (mean diameter of 9 mm). No acute consolidative airspace disease. No pleural effusions. Musculoskeletal/Soft Tissues: Chronic compression fracture of T8 with 90% loss of anterior  vertebral body height. New compression fracture of T12 with 40% loss of anterior vertebral body height, which does not appear acute. There are no aggressive appearing lytic or blastic lesions noted in the visualized portions of the skeleton. CTA ABDOMEN AND PELVIS FINDINGS Hepatobiliary: No suspicious cystic or solid hepatic lesions. No intra or extrahepatic biliary ductal dilatation. Gallbladder is normal in appearance. Pancreas: No pancreatic mass. No pancreatic ductal dilatation. No pancreatic or peripancreatic fluid or inflammatory changes. Spleen: Unremarkable. Adrenals/Urinary Tract: Mild diffuse cortical atrophy in the left kidney. Right kidney and adrenal glands are normal in appearance. No hydroureteronephrosis. Urinary bladder is normal in appearance. Stomach/Bowel: Normal appearance of the stomach. No pathologic dilatation of small bowel or colon. Numerous colonic diverticulae are noted, without surrounding inflammatory changes to suggest an acute diverticulitis at this time. The appendix is not confidently identified and may be surgically absent. Regardless, there are no inflammatory changes noted adjacent to the cecum to suggest the presence of an acute appendicitis at this time. Vascular/Lymphatic: Aortic atherosclerosis with fusiform aneurysmal dilatation of the infrarenal abdominal aorta which measures up to 3.7 x 4.0 cm. Vascular findings and measurements pertinent to potential TAVR procedure, as detailed below. No lymphadenopathy noted in the abdomen or pelvis. Reproductive: Prostate gland and seminal vesicles are unremarkable in appearance. Other: No significant volume of ascites.  No pneumoperitoneum. Musculoskeletal: Chronic compression fracture of L4 with 30% loss of anterior vertebral body height. There are no aggressive appearing lytic or blastic lesions noted in the visualized portions of the skeleton. VASCULAR MEASUREMENTS PERTINENT TO TAVR: AORTA: Minimal Aortic Diameter-20 x 14 mm  Severity of Aortic Calcification-severe RIGHT PELVIS: Right Common Iliac Artery - Minimal Diameter-10.3 x 8.3 mm Tortuosity-moderate to severe Calcification-moderate Right External Iliac Artery - Minimal Diameter-8.3 x 7.2 mm Tortuosity-mild-to-moderate Calcification-mild Right Common Femoral Artery - Minimal Diameter-8.3 x 8.3 mm Tortuosity-mild Calcification-mild LEFT PELVIS: Left Common Iliac Artery - Minimal Diameter-8.4 x 8.6 mm Tortuosity-mild Calcification-moderate Left External Iliac Artery - Minimal Diameter-7.7 x 7.9 mm Tortuosity-mild Calcification-mild Left Common Femoral Artery - Minimal Diameter-8.2 x 8.0 mm Tortuosity-mild Calcification-moderate Review of the MIP images confirms the above findings. IMPRESSION: 1. Vascular findings and measurements pertinent to potential TAVR procedure, as detailed above. 2. Severe thickening calcification of the aortic valve, compatible with the reported clinical history of aortic stenosis. 3. Aortic atherosclerosis, in addition to left main and left anterior descending coronary artery disease. There is also ectasia of the ascending thoracic aorta (4.0 cm in diameter), as well as fusiform aneurysmal dilatation of the infrarenal abdominal aorta (3.7 x 4.0 cm). Recommend followup by ultrasound in 2 years. This recommendation follows ACR consensus guidelines: White Paper of the ACR Incidental Findings Committee II on Vascular Findings. J Am Coll Radiol 2013; 10:789-794. 4. Multiple small pulmonary nodules in the right lung, largest of which measures up to a mean diameter of 9 mm in the right lower lobe. Non-contrast chest CT at 3-6 months is recommended.  If the nodules are stable at time of repeat CT, then future CT at 18-24 months (from today's scan) is considered optional for low-risk patients, but is recommended for high-risk patients. This recommendation follows the consensus statement: Guidelines for Management of Incidental Pulmonary Nodules Detected on CT Images:  From the Fleischner Society 2017; Radiology 2017; 284:228-243. 5. Colonic diverticulosis without evidence of acute diverticulitis at this time. 6. Additional incidental findings, as above. Electronically Signed   By: Trudie Reed M.D.   On: 03/11/2019 12:27   Vas US Carotid  Result Date: 03/11/2019 Carotid Arterial Duplex Study Indications:       Pre-TAVR. Other Factors:     Aortic stenosis. Limitations:       Patient body habitus, patient anatomy, patient movement Comparison Study:  No prior studies. Performing Technologist: Chanda Busing RVT  Examination Guidelines: A complete evaluation includes B-mode imaging, spectral Doppler, color Doppler, and power Doppler as needed of all accessible portions of each vessel. Bilateral testing is considered an integral part of a complete examination. Limited examinations for reoccurring indications may be performed as noted.  Right Carotid Findings: +----------+--------+--------+--------+-----------------------+--------+             PSV cm/s EDV cm/s Stenosis Describe                Comments  +----------+--------+--------+--------+-----------------------+--------+  CCA Prox   72       10                                        tortuous  +----------+--------+--------+--------+-----------------------+--------+  CCA Distal 71       12                smooth and heterogenous           +----------+--------+--------+--------+-----------------------+--------+  ICA Prox   60       15                smooth and heterogenous           +----------+--------+--------+--------+-----------------------+--------+  ICA Distal 77       17                                                  +----------+--------+--------+--------+-----------------------+--------+  ECA        56       0                                                   +----------+--------+--------+--------+-----------------------+--------+ +----------+--------+-------+--------+-------------------+             PSV cm/s EDV  cms Describe Arm Pressure (mmHG)  +----------+--------+-------+--------+-------------------+  Subclavian 88                                             +----------+--------+-------+--------+-------------------+ +---------+--------+--+--------+-+---------+  Vertebral PSV cm/s 30 EDV cm/s 6 Antegrade  +---------+--------+--+--------+-+---------+  Left Carotid Findings: +----------+--------+-------+--------+--------------------------------+--------+             PSV cm/s EDV  Stenosis Describe                         Comments                       cm/s                                                        +----------+--------+-------+--------+--------------------------------+--------+  CCA Prox   67       12               smooth and heterogenous          tortuous  +----------+--------+-------+--------+--------------------------------+--------+  CCA Distal 77       19               smooth and heterogenous                    +----------+--------+-------+--------+--------------------------------+--------+  ICA Prox   64       16               smooth, heterogenous and                                                         calcific                                   +----------+--------+-------+--------+--------------------------------+--------+  ICA Distal 69       20                                                          +----------+--------+-------+--------+--------------------------------+--------+  ECA        85       9                                                           +----------+--------+-------+--------+--------------------------------+--------+ +----------+--------+--------+--------+-------------------+  Subclavian PSV cm/s EDV cm/s Describe Arm Pressure (mmHG)  +----------+--------+--------+--------+-------------------+             91                                              +----------+--------+--------+--------+-------------------+ +---------+--------+--+--------+-+---------+  Vertebral PSV  cm/s 39 EDV cm/s 8 Antegrade  +---------+--------+--+--------+-+---------+  Summary: Right Carotid: Velocities in the right ICA are consistent with a 1-39% stenosis. Left Carotid: Velocities in the left ICA are consistent with a 1-39% stenosis. Vertebrals: Bilateral vertebral arteries demonstrate antegrade flow. *See table(s) above for measurements and observations.  Electronically signed by Waverly Ferrari MD on 03/11/2019 at  6:29:41 AM.    Final    Ct Angio Abd/pel W/ And/or W/o  Result Date: 03/11/2019 CLINICAL DATA:  85 year old male with history of severe aortic stenosis. Preprocedural study prior to potential transcatheter aortic valve replacement (TAVR) procedure. EXAM: CT ANGIOGRAPHY CHEST, ABDOMEN AND PELVIS TECHNIQUE: Multidetector CT imaging through the chest, abdomen and pelvis was performed using the standard protocol during bolus administration of intravenous contrast. Multiplanar reconstructed images and MIPs were obtained and reviewed to evaluate the vascular anatomy. CONTRAST:  OMNIPAQUE IOHEXOL 350 MG/ML SOLN COMPARISON:  Chest CT 01/30/2018. CT the abdomen and pelvis 12/03/2016. FINDINGS: CTA CHEST FINDINGS Cardiovascular: Heart size is normal. Small amount of pericardial fluid and/or thickening, unlikely to be of any hemodynamic significance at this time. No associated pericardial calcification. There is aortic atherosclerosis, as well as atherosclerosis of the great vessels of the mediastinum and the coronary arteries, including calcified atherosclerotic plaque in the left main and left anterior descending coronary arteries. Ectasia of ascending thoracic aorta (4.0 cm in diameter). Severe thickening calcification of the aortic valve. Mediastinum/Lymph Nodes: No pathologically enlarged mediastinal or hilar lymph nodes. Esophagus is unremarkable in appearance. No axillary lymphadenopathy. Lungs/Pleura: Several small pulmonary nodules are noted in the right lung, largest of which is in  the periphery of the right lower lobe (axial image 61 of series 17) measuring 10 x 8 mm (mean diameter of 9 mm). No acute consolidative airspace disease. No pleural effusions. Musculoskeletal/Soft Tissues: Chronic compression fracture of T8 with 90% loss of anterior vertebral body height. New compression fracture of T12 with 40% loss of anterior vertebral body height, which does not appear acute. There are no aggressive appearing lytic or blastic lesions noted in the visualized portions of the skeleton. CTA ABDOMEN AND PELVIS FINDINGS Hepatobiliary: No suspicious cystic or solid hepatic lesions. No intra or extrahepatic biliary ductal dilatation. Gallbladder is normal in appearance. Pancreas: No pancreatic mass. No pancreatic ductal dilatation. No pancreatic or peripancreatic fluid or inflammatory changes. Spleen: Unremarkable. Adrenals/Urinary Tract: Mild diffuse cortical atrophy in the left kidney. Right kidney and adrenal glands are normal in appearance. No hydroureteronephrosis. Urinary bladder is normal in appearance. Stomach/Bowel: Normal appearance of the stomach. No pathologic dilatation of small bowel or colon. Numerous colonic diverticulae are noted, without surrounding inflammatory changes to suggest an acute diverticulitis at this time. The appendix is not confidently identified and may be surgically absent. Regardless, there are no inflammatory changes noted adjacent to the cecum to suggest the presence of an acute appendicitis at this time. Vascular/Lymphatic: Aortic atherosclerosis with fusiform aneurysmal dilatation of the infrarenal abdominal aorta which measures up to 3.7 x 4.0 cm. Vascular findings and measurements pertinent to potential TAVR procedure, as detailed below. No lymphadenopathy noted in the abdomen or pelvis. Reproductive: Prostate gland and seminal vesicles are unremarkable in appearance. Other: No significant volume of ascites.  No pneumoperitoneum. Musculoskeletal: Chronic  compression fracture of L4 with 30% loss of anterior vertebral body height. There are no aggressive appearing lytic or blastic lesions noted in the visualized portions of the skeleton. VASCULAR MEASUREMENTS PERTINENT TO TAVR: AORTA: Minimal Aortic Diameter-20 x 14 mm Severity of Aortic Calcification-severe RIGHT PELVIS: Right Common Iliac Artery - Minimal Diameter-10.3 x 8.3 mm Tortuosity-moderate to severe Calcification-moderate Right External Iliac Artery - Minimal Diameter-8.3 x 7.2 mm Tortuosity-mild-to-moderate Calcification-mild Right Common Femoral Artery - Minimal Diameter-8.3 x 8.3 mm Tortuosity-mild Calcification-mild LEFT PELVIS: Left Common Iliac Artery - Minimal Diameter-8.4 x 8.6 mm Tortuosity-mild Calcification-moderate Left External Iliac Artery - Minimal Diameter-7.7  x 7.9 mm Tortuosity-mild Calcification-mild Left Common Femoral Artery - Minimal Diameter-8.2 x 8.0 mm Tortuosity-mild Calcification-moderate Review of the MIP images confirms the above findings. IMPRESSION: 1. Vascular findings and measurements pertinent to potential TAVR procedure, as detailed above. 2. Severe thickening calcification of the aortic valve, compatible with the reported clinical history of aortic stenosis. 3. Aortic atherosclerosis, in addition to left main and left anterior descending coronary artery disease. There is also ectasia of the ascending thoracic aorta (4.0 cm in diameter), as well as fusiform aneurysmal dilatation of the infrarenal abdominal aorta (3.7 x 4.0 cm). Recommend followup by ultrasound in 2 years. This recommendation follows ACR consensus guidelines: White Paper of the ACR Incidental Findings Committee II on Vascular Findings. J Am Coll Radiol 2013; 10:789-794. 4. Multiple small pulmonary nodules in the right lung, largest of which measures up to a mean diameter of 9 mm in the right lower lobe. Non-contrast chest CT at 3-6 months is recommended. If the nodules are stable at time of repeat CT, then  future CT at 18-24 months (from today's scan) is considered optional for low-risk patients, but is recommended for high-risk patients. This recommendation follows the consensus statement: Guidelines for Management of Incidental Pulmonary Nodules Detected on CT Images: From the Fleischner Society 2017; Radiology 2017; 284:228-243. 5. Colonic diverticulosis without evidence of acute diverticulitis at this time. 6. Additional incidental findings, as above. Electronically Signed   By: Trudie Reed M.D.   On: 03/11/2019 12:27   Disposition   Patient was seen and examined by Dr. Clifton James who deemed patient as stable for discharge. Follow-up has been arranged. Discharge medications as listed below.   Follow-up Plans & Appointments    Follow-up Information    Janetta Hora, PA-C Follow up on 03/25/2019.   Specialties: Cardiology, Radiology Why: Please arrive 15 minutes early for your in-office appointment at 2:30pm 03/25/2019 with Carlean Jews, PA-C.  Contact information: [address] [place] [address] [phone]            Discharge Medications   Allergies as of 03/20/2019   No Known Allergies     Medication List    STOP taking these medications   naproxen sodium 220 MG tablet Commonly known as: ALEVE     TAKE these medications   acetaminophen 500 MG tablet Commonly known as: TYLENOL Take 1,000 mg by mouth every 6 (six) hours as needed for headache (pain).   amLODipine 5 MG tablet Commonly known as: NORVASC Take 1 tablet (5 mg total) by mouth daily.   aspirin 81 MG chewable tablet Chew 1 tablet (81 mg total) by mouth daily.   atorvastatin 20 MG tablet Commonly known as: LIPITOR Take 1 tablet (20 mg total) by mouth daily at 6 PM.   clopidogrel 75 MG tablet Commonly known as: PLAVIX Take 1 tablet (75 mg total) by mouth daily with breakfast.   furosemide 20 MG tablet Commonly known as: LASIX Take 1 tablet (20 mg total) by mouth as  needed. What changed:   when to take this  reasons to take this   lisinopril 10 MG tablet Commonly known as: ZESTRIL Take 1 tablet (10 mg total) by mouth daily.   loratadine 10 MG tablet Commonly known as: CLARITIN Take 1 tablet (10 mg total) by mouth daily. What changed:   when to take this  reasons to take this   Memantine HCl-Donepezil HCl 28-10 MG Cp24 Commonly known as: Namzaric Take 1 capsule by mouth daily.  multivitamin with minerals Tabs tablet Take 1 tablet by mouth daily.   sertraline 50 MG tablet Commonly known as: ZOLOFT Take 1 tablet (50 mg total) by mouth daily.         Outstanding Labs/Studies   Echo in 1 month - scheduled for 04/08/2019  Duration of Discharge Encounter   Greater than 30 minutes including physician time.  Signed, Beatriz Stallion PA-C 03/20/2019, 8:03 AM

## 2019-03-18 NOTE — Progress Notes (Signed)
  Echocardiogram 2D Echocardiogram has been performed.  Dan Perez 03/18/2019, 10:47 AM

## 2019-03-18 NOTE — Progress Notes (Signed)
CARDIAC REHAB PHASE I   PRE:  Rate/Rhythm: 71 SR  BP:  Sitting: 136/72      SaO2: 98 Ra  MODE:  Ambulation: 70 ft   POST:  Rate/Rhythm: 98 Sr  BP:  Sitting: 155/82    SaO2: 97 RA   Pt ambulated 61ft in hallway assist of one with front wheel walker and gait belt. Pt took several short standing rest breaks c/o arm and leg weakness, and SOB. Pt denies CP. Pt returned to recliner, call bell and phone within reach. Reviewed restrictions and site care. Encouraged ambulation as able with emphasis on safety. Pt declining CRP II at this time.   [id number] Rufina Falco, RN BSN 03/18/2019 11:07 AM

## 2019-03-18 NOTE — Progress Notes (Addendum)
Physical Therapy Treatment Patient Details Name: Dan Perez E Lasseigne MRN: [id number] DOB: 12-03-1931 Today's Date: 03/18/2019    History of Present Illness 85 y/o m with a h/o progressive and now severe aortic stenosis, moderate MR, HTN, CKD III, and coronary calcium, tranferred from [facility] for admission following syncope.Admitted for evaluation of AS. s/p cardiac cath 6/23. Underwent TAVR 6/30.      PT Comments    Pt admitted with above diagnosis. Pt currently with functional limitations due to the deficits listed below (see PT Problem List). Pt was able to ambulate with RW with min guard assist to min assist with slightly unsteady gait at times. Did require mod assist to stand from recliner as well and unsteady with posterior lean.  Fatigues quickly as well.  Feel that pt will need SNF to gain strength and endurance prior to d/c home.   Pt will benefit from skilled PT to increase their independence and safety with mobility to allow discharge to the venue listed below.     Follow Up Recommendations  SNF;Supervision/Assistance - 24 hour     Equipment Recommendations  Rolling walker with 5" wheels    Recommendations for Other Services       Precautions / Restrictions Precautions Precautions: Fall Precaution Comments: hx of syncope with fall Restrictions Weight Bearing Restrictions: No    Mobility  Bed Mobility               General bed mobility comments: pt in chair on arrival.  Transfers Overall transfer level: Needs assistance Equipment used: Rolling walker (2 wheeled) Transfers: Sit to/from Stand Sit to Stand: Mod assist         General transfer comment: cueing for hand placement and safety, mod assist from low recliner with momentum and increased time.  Posterior lean initially as well needing assist to steady.  Pt with kyphotic posture in sitting and standing.   Ambulation/Gait Ambulation/Gait assistance: Min guard;Min assist Gait Distance (Feet): 40  Feet Assistive device: Rolling walker (2 wheeled) Gait Pattern/deviations: Step-through pattern;Narrow base of support;Trunk flexed;Decreased stride length;Leaning posteriorly Gait velocity: slowed Gait velocity interpretation: 1.31 - 2.62 ft/sec, indicative of limited community ambulator General Gait Details: min assist for directing RW at times with cues for direction, proximity to RW and posture. Pt maintains flexed trunk with decreased safety awareness   Stairs             Wheelchair Mobility    Modified Rankin (Stroke Patients Only)       Balance Overall balance assessment: Needs assistance         Standing balance support: Bilateral upper extremity supported Standing balance-Leahy Scale: Poor Standing balance comment: bil UE support for gait                            Cognition Arousal/Alertness: Awake/alert Behavior During Therapy: WFL for tasks assessed/performed Overall Cognitive Status: Impaired/Different from baseline Area of Impairment: Orientation;Problem solving;Safety/judgement                 Orientation Level: Disoriented to;Time   Memory: Decreased short-term memory Following Commands: Follows one step commands consistently Safety/Judgement: Decreased awareness of safety;Decreased awareness of deficits Awareness: Intellectual Problem Solving: Slow processing        Exercises General Exercises - Lower Extremity Long Arc Quad: AROM;20 reps;Both;Seated Hip Flexion/Marching: AROM;20 reps;Both;Seated    General Comments General comments (skin integrity, edema, etc.): VSS      Pertinent Vitals/Pain Pain Assessment: No/denies  pain    Home Living                      Prior Function            PT Goals (current goals can now be found in the care plan section) Acute Rehab PT Goals Patient Stated Goal: go home with daughters Progress towards PT goals: Progressing toward goals    Frequency    Min  3X/week      PT Plan Discharge plan needs to be updated    Co-evaluation              AM-PAC PT "6 Clicks" Mobility   Outcome Measure  Help needed turning from your back to your side while in a flat bed without using bedrails?: None Help needed moving from lying on your back to sitting on the side of a flat bed without using bedrails?: A Little Help needed moving to and from a bed to a chair (including a wheelchair)?: A Little Help needed standing up from a chair using your arms (e.g., wheelchair or bedside chair)?: A Lot Help needed to walk in hospital room?: A Little Help needed climbing 3-5 steps with a railing? : A Little 6 Click Score: 18    End of Session Equipment Utilized During Treatment: Gait belt Activity Tolerance: Patient limited by fatigue Patient left: in chair;with call bell/phone within reach Nurse Communication: Mobility status PT Visit Diagnosis: Unsteadiness on feet (R26.81);Other abnormalities of gait and mobility (R26.89);Muscle weakness (generalized) (M62.81);History of falling (Z91.81);Difficulty in walking, not elsewhere classified (R26.2)     Time: 8099-8338 PT Time Calculation (min) (ACUTE ONLY): 17 min  Charges:     Re-eval [place] Pager:  [phone]  Office:  [place] 03/18/2019, 1:19 PM

## 2019-03-18 NOTE — TOC Initial Note (Signed)
Transition of Care [facility](TOC) - Initial/Assessment Note    Patient Details  Name: Dan BrimLawrence E Pieczynski MRN: [id number] Date of Birth: 01-03-1932  Transition of Care [facility](TOC) CM/SW Contact:    Margarito LinerSarah C Honest Vanleer, LCSW Phone Number: 03/18/2019, 3:57 PM  Clinical Narrative: Patient not fully oriented. CSW called patient's daughter/legal guardian, Dan Perez, introduced role, and explained that PT recommendations would be discussed. Ms. Clinton Quanterrazas is agreeable to SNF placement. First preference is [facility] because her stepdaughter is a CNA there. Sent referral and notified their admissions coordinator. [facility] [place]. No further concerns. CSW encouraged patient to contact CSW as needed. CSW will continue to follow patient and his daughter for support and facilitate discharge to SNF once bed offers and insurance authorization obtained.                 Expected Discharge Plan: Skilled Nursing Facility Barriers to Discharge: Insurance Authorization, SNF Pending bed offer   Patient Goals and CMS Choice Patient states their goals for this hospitalization and ongoing recovery are:: Patient not fully oriented.      Expected Discharge Plan and Services Expected Discharge Plan: Skilled Nursing Facility     Post Acute Care Choice: Skilled Nursing Facility Living arrangements for the past 2 months: Single Family Home                                      Prior Living Arrangements/Services Living arrangements for the past 2 months: Single Family Home Lives with:: Adult Children Patient language and need for interpreter reviewed:: Yes(No needs.) Do you feel safe going back to the place where you live?: Yes      Need for Family Participation in Patient Care: Yes (Comment) Care giver support system in place?: Yes (comment)   Criminal Activity/Legal Involvement Pertinent to Current Situation/Hospitalization: No - Comment as needed  Activities of Daily Living Home  Assistive Devices/Equipment: Cane (specify quad or straight) ADL Screening (condition at time of admission) Patient's cognitive ability adequate to safely complete daily activities?: Yes Is the patient deaf or have difficulty hearing?: Yes Does the patient have difficulty seeing, even when wearing glasses/contacts?: No Does the patient have difficulty concentrating, remembering, or making decisions?: No Patient able to express need for assistance with ADLs?: Yes(spoke with legal guardian) Does the patient have difficulty dressing or bathing?: Yes Independently performs ADLs?: Yes (appropriate for developmental age) Does the patient have difficulty walking or climbing stairs?: Yes Weakness of Legs: Both Weakness of Arms/Hands: Both  Permission Sought/Granted Permission sought to share information with : Facility Medical sales representativeContact Representative, Family Supports Permission granted to share information with : (Patient not fully oriented.)  Share Information with NAME: Dan Perez  Permission granted to share info w AGENCY: SNF's  Permission granted to share info w Relationship: Daughter/Legal Guardian  Permission granted to share info w Contact Information: [phone]  Emotional Assessment Appearance:: Appears stated age Attitude/Demeanor/Rapport: Unable to Assess Affect (typically observed): Unable to Assess Orientation: : Oriented to Self, Oriented to Place, Oriented to  Time Alcohol / Substance Use: Never Used Psych Involvement: No (comment)  Admission diagnosis:  Severe aortic stenosis [I35.0] Patient Active Problem List   Diagnosis Date Noted  . CKD (chronic kidney disease) stage 3, GFR 30-59 ml/min (HCC) 03/18/2019  . S/P TAVR (transcatheter aortic valve replacement) 03/17/2019  . Protein-calorie malnutrition, severe 03/16/2019  . Severe aortic stenosis   . Syncope  03/06/2019  . Severe aortic stenosis by prior echocardiogram 02/16/2019  . 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 Hypothyroid 04/22/2017  . Inguinal hernia unilateral, non-recurrent 12/17/2013  . AAA (abdominal aortic aneurysm) without rupture ([place]) 11/24/2013  . Essential hypertension, benign 10/23/2013  . Cardiac murmur 10/23/2013  . Mild neurocognitive disorder due to Alzheimer's disease ([place]) 07/25/2013  . Venous stasis dermatitis 07/25/2013  . Delusional disorder ([place]) 07/16/2013  . Substernal thyroid goiter 03/27/2011   PCP:  Dettinger, Fransisca Kaufmann, MD Pharmacy:   [place], [place] [address] [place] [address] Phone: [phone] Fax: [phone]     Social Determinants of Health (SDOH) Interventions    Readmission Risk Interventions No flowsheet data found.

## 2019-03-18 NOTE — [place] FL2 (Signed)
[place] MEDICAID FL2 LEVEL OF CARE SCREENING TOOL     IDENTIFICATION  Patient Name: Dan Perez Birthdate: [birth year] Sex: male Admission Date (Current Location): 03/06/2019  [facility] [name]   Facility and Address:         Provider Number: [id number]  Attending Physician Name and Address:  Kathleene HazelMcAlhany, Christopher D,*  Relative Name and Phone Number:       Current Level of Care: Hospital Recommended Level of Care: Skilled Nursing Facility Prior Approval Number:    Date Approved/Denied:   PASRR Number: [id number]  Discharge Plan: SNF    Current Diagnoses: Patient Active Problem List   Diagnosis Date Noted  . CKD (chronic kidney disease) stage 3, GFR 30-59 ml/min (HCC) 03/18/2019  . S/P TAVR (transcatheter aortic valve replacement) 03/17/2019  . Protein-calorie malnutrition, severe 03/16/2019  . Severe aortic stenosis   . Syncope 03/06/2019  . Severe aortic stenosis by prior echocardiogram 02/16/2019  . 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 Hypothyroid 04/22/2017  . Inguinal hernia unilateral, non-recurrent 12/17/2013  . AAA (abdominal aortic aneurysm) without rupture (HCC) 11/24/2013  . Essential hypertension, benign 10/23/2013  . Cardiac murmur 10/23/2013  . Mild neurocognitive disorder due to Alzheimer's disease (HCC) 07/25/2013  . Venous stasis dermatitis 07/25/2013  . Delusional disorder (HCC) 07/16/2013  . Substernal thyroid goiter 03/27/2011    Orientation RESPIRATION BLADDER Height & Weight     Self, Time, Place  Normal Incontinent, External catheter Weight: 144 lb 10 oz (65.6 kg) Height:  6' (182.9 cm)  BEHAVIORAL SYMPTOMS/MOOD NEUROLOGICAL BOWEL NUTRITION STATUS  (None) (Dementia) Continent Diet(Heart healthy)  AMBULATORY STATUS COMMUNICATION OF NEEDS Skin   Limited Assist Verbally Skin abrasions, Bruising, Other (Comment), Surgical wounds(Cracking, Skin tear.)                        Personal Care Assistance Level of Assistance  Bathing, Dressing, Feeding Bathing Assistance: Limited assistance Feeding assistance: Limited assistance Dressing Assistance: Limited assistance     Functional Limitations Info  Sight, Hearing, Speech Sight Info: Adequate Hearing Info: Adequate Speech Info: Adequate    SPECIAL CARE FACTORS FREQUENCY  PT (By licensed PT), OT (By licensed OT)     PT Frequency: 5 x week OT Frequency: 5 x week            Contractures Contractures Info: Not present    Additional Factors Info  Code Status, Allergies Code Status Info: Full code Allergies Info: NKDA           Current Medications (03/18/2019):  This is the current hospital active medication list Current Facility-Administered Medications  Medication Dose Route Frequency Provider Last Rate Last Dose  . 0.9 %  sodium chloride infusion  250 mL Intravenous PRN Kathleene HazelMcAlhany, Christopher D, MD      . acetaminophen (TYLENOL) tablet 650 mg  650 mg Oral Q6H PRN Kathleene HazelMcAlhany, Christopher D, MD       Or  . acetaminophen (TYLENOL) suppository 650 mg  650 mg Rectal Q6H PRN Kathleene HazelMcAlhany, Christopher D, MD      . amLODipine (NORVASC) tablet 5 mg  5 mg Oral Daily Kathleene HazelMcAlhany, Christopher D, MD   5 mg at 03/18/19 0840  . aspirin chewable tablet 81 mg  81 mg Oral Daily Kathleene HazelMcAlhany, Christopher D, MD   81 mg at 03/18/19 0840  . cefUROXime (ZINACEF) 1.5 g in sodium chloride 0.9 % 100 mL IVPB  1.5 g Intravenous Q12H Kathleene HazelMcAlhany, Christopher D,  MD 200 mL/hr at 03/18/19 1109 1.5 g at 03/18/19 1109  . clopidogrel (PLAVIX) tablet 75 mg  75 mg Oral Q breakfast Burnell Blanks, MD   75 mg at 03/18/19 0840  . memantine (NAMENDA XR) 24 hr capsule 28 mg  28 mg Oral QHS Burnell Blanks, MD   28 mg at 03/17/19 2219   And  . donepezil (ARICEPT) tablet 10 mg  10 mg Oral QHS Burnell Blanks, MD   10 mg at 03/17/19 2219  . feeding supplement (ENSURE ENLIVE) (ENSURE ENLIVE) liquid 237 mL  237 mL Oral BID BM  Burnell Blanks, MD   237 mL at 03/18/19 1400  . metoprolol tartrate (LOPRESSOR) injection 2.5-5 mg  2.5-5 mg Intravenous Q2H PRN Lauree Chandler D, MD      . morphine 2 MG/ML injection 1-4 mg  1-4 mg Intravenous Q1H PRN Burnell Blanks, MD      . multivitamin with minerals tablet 1 tablet  1 tablet Oral Daily Burnell Blanks, MD   1 tablet at 03/18/19 0840  . nitroGLYCERIN 50 mg in dextrose 5 % 250 mL (0.2 mg/mL) infusion  0-100 mcg/min Intravenous Titrated Burnell Blanks, MD      . ondansetron [facility]) injection 4 mg  4 mg Intravenous Q6H PRN Burnell Blanks, MD      . oxyCODONE (Oxy IR/ROXICODONE) immediate release tablet 5-10 mg  5-10 mg Oral Q3H PRN Burnell Blanks, MD      . phenylephrine (NEOSYNEPHRINE) 20-0.9 MG/250ML-% infusion  0-100 mcg/min Intravenous Titrated Burnell Blanks, MD      . polyethylene glycol (MIRALAX / GLYCOLAX) packet 17 g  17 g Oral Daily Burnell Blanks, MD   17 g at 03/18/19 0840  . sertraline (ZOLOFT) tablet 50 mg  50 mg Oral Daily Burnell Blanks, MD   50 mg at 03/18/19 0840  . sodium chloride flush (NS) 0.9 % injection 3 mL  3 mL Intravenous Q12H Burnell Blanks, MD   3 mL at 03/18/19 0841  . sodium chloride flush (NS) 0.9 % injection 3 mL  3 mL Intravenous PRN Burnell Blanks, MD      . traMADol Veatrice Bourbon) tablet 50-100 mg  50-100 mg Oral Q4H PRN Burnell Blanks, MD         Discharge Medications: Please see discharge summary for a list of discharge medications.  Relevant Imaging Results:  Relevant Lab Results:   Additional Information SS#: [id number]  Candie Chroman, LCSW

## 2019-03-18 NOTE — Plan of Care (Signed)

## 2019-03-18 NOTE — Progress Notes (Signed)
Occupational Therapy Treatment Patient Details Name: Dan Perez MRN: [id number] DOB: 07/07/1932 Today's Date: 03/18/2019    History of present illness 85 y/o m with a h/o progressive and now severe aortic stenosis, moderate MR, HTN, CKD III, and coronary calcium, tranferred from [facility] for admission following syncope.Admitted for evaluation of AS. s/p cardiac cath 6/23. Underwent TAVR 6/30.     OT comments  Patient seated in recliner and agreeable to OT.  Pt "tangled" in cords and requesting assistance, noted required increased cueing for problem solving and short term memory today but patient also appears fatigued.  Patient assisted to 3:1 commode, mod assist with transfers given max cueing for hand placement and sequencing.  Mod assist for toileting after +BM. Returned pt to supine in bed to rest, min guard for safety.  Will follow acutely, updated dc plan to SNF rehab to maximize independence with ADLs and decrease burden of care prior to dc home.    Follow Up Recommendations  SNF;Supervision/Assistance - 24 hour    Equipment Recommendations  3 in 1 bedside commode    Recommendations for Other Services      Precautions / Restrictions Precautions Precautions: Fall Precaution Comments: hx of syncope with fall Restrictions Weight Bearing Restrictions: No       Mobility Bed Mobility Overal bed mobility: Needs Assistance Bed Mobility: Sit to Supine       Sit to supine: Min guard   General bed mobility comments: min guard for safety, cueing for sequencing     Transfers Overall transfer level: Needs assistance Equipment used: Rolling walker (2 wheeled) Transfers: Sit to/from Stand Sit to Stand: Mod assist         General transfer comment: mod assist to power up with cueing for hand placement, sequencing and technique; noted increased assist today compared to monday and increased posterior lean     Balance Overall balance assessment: Needs  assistance Sitting-balance support: No upper extremity supported;Feet supported Sitting balance-Leahy Scale: Good     Standing balance support: Bilateral upper extremity supported;Single extremity supported;During functional activity Standing balance-Leahy Scale: Poor Standing balance comment: relaint on B UE for mobility, engaged in self care with 1 UE support with min guard                            ADL either performed or assessed with clinical judgement   ADL Overall ADL's : Needs assistance/impaired                     Lower Body Dressing: Sit to/from stand Lower Body Dressing Details (indicate cue type and reason): pt able to adjust socks with ease in sitting, mod assist sit<>stand and requires assist with clothing mgmt over hips  Toilet Transfer: Moderate assistance;Ambulation;BSC;RW Toilet Transfer Details (indicate cue type and reason): mod assist to ascend/descend from recliner and BSC with cueing for hand placement  Toileting- Clothing Manipulation and Hygiene: Moderate assistance;Sit to/from stand Toileting - Clothing Manipulation Details (indicate cue type and reason): for hygiene thoroughness and clothing mgmt      Functional mobility during ADLs: Minimal assistance;Rolling walker       Vision       Perception     Praxis      Cognition Arousal/Alertness: Awake/alert Behavior During Therapy: WFL for tasks assessed/performed Overall Cognitive Status: Impaired/Different from baseline Area of Impairment: Orientation;Problem solving;Safety/judgement;Memory;Following commands;Awareness  Orientation Level: Disoriented to;Time;Situation   Memory: Decreased short-term memory Following Commands: Follows one step commands consistently;Follows one step commands with increased time Safety/Judgement: Decreased awareness of safety;Decreased awareness of deficits Awareness: Intellectual Problem Solving: Slow processing;Difficulty  sequencing;Requires verbal cues General Comments: entering room patient requesting assistance, voicing "I"m all tangled up". Continues to present with decreased STM, decreased awareness of deficits and situation of what brought him to the hopsital.  He is pleasant and follows commands with increased time.        Exercises Exercises: General Lower Extremity General Exercises - Lower Extremity Long Arc Quad: AROM;20 reps;Both;Seated Hip Flexion/Marching: AROM;20 reps;Both;Seated   Shoulder Instructions       General Comments VSS    Pertinent Vitals/ Pain       Pain Assessment: No/denies pain  Home Living                                          Prior Functioning/Environment              Frequency  Min 2X/week        Progress Toward Goals  OT Goals(current goals can now be found in the care plan section)  Progress towards OT goals: Not progressing toward goals - comment(pt requiring increased assist today)  Acute Rehab OT Goals Patient Stated Goal: go home with daughters OT Goal Formulation: With patient  Plan Discharge plan needs to be updated;Frequency remains appropriate    Co-evaluation                 AM-PAC OT "6 Clicks" Daily Activity     Outcome Measure   Help from another person eating meals?: None Help from another person taking care of personal grooming?: A Little Help from another person toileting, which includes using toliet, bedpan, or urinal?: A Lot Help from another person bathing (including washing, rinsing, drying)?: A Little Help from another person to put on and taking off regular upper body clothing?: A Little Help from another person to put on and taking off regular lower body clothing?: A Lot 6 Click Score: 17    End of Session Equipment Utilized During Treatment: Gait belt;Rolling walker  OT Visit Diagnosis: Unsteadiness on feet (R26.81);Other abnormalities of gait and mobility (R26.89);Other symptoms and  signs involving cognitive function;Muscle weakness (generalized) (M62.81)   Activity Tolerance Patient tolerated treatment well   Patient Left in bed;with call bell/phone within reach;with bed alarm set   Nurse Communication Mobility status;Other (comment)(+BM)        Time: [id number] OT Time Calculation (min): 28 min  Charges: OT General Charges $OT Visit: 1 Visit OT Treatments $Self Care/Home Management : 23-37 mins  Chancy Milroyhristie S Guerino Caporale, OT Acute Rehabilitation Services Pager [phone] Office [phone]    Chancy MilroyChristie S Awanda Wilcock 03/18/2019, 2:54 PM

## 2019-03-18 NOTE — Progress Notes (Addendum)
Progress Note  Patient Name: Dan Perez Date of Encounter: 03/18/2019  Primary Cardiologist: Dan DockerSuresh Koneswaran, MD   Subjective   No pain or dyspnea this am. No events overnight  Inpatient Medications    Scheduled Meds: . amLODipine  5 mg Oral Daily  . aspirin  81 mg Oral Daily  . clopidogrel  75 mg Oral Q breakfast  . memantine  28 mg Oral QHS   And  . donepezil  10 mg Oral QHS  . feeding supplement (ENSURE ENLIVE)  237 mL Oral BID BM  . multivitamin with minerals  1 tablet Oral Daily  . polyethylene glycol  17 g Oral Daily  . sertraline  50 mg Oral Daily  . sodium chloride flush  3 mL Intravenous Q12H   Continuous Infusions: . sodium chloride    . cefUROXime (ZINACEF)  IV Stopped (03/17/19 2249)  . nitroGLYCERIN    . phenylephrine (NEO-SYNEPHRINE) Adult infusion     PRN Meds: sodium chloride, acetaminophen **OR** acetaminophen, metoprolol tartrate, morphine injection, ondansetron (ZOFRAN) IV, oxyCODONE, sodium chloride flush, traMADol   Vital Signs    Vitals:   03/17/19 2200 03/17/19 2243 03/18/19 0000 03/18/19 0534  BP: (!) 154/85 137/80 (!) 148/85 137/74  Pulse: 70 72 78 72  Resp: (!) 25 (!) 21 (!) 24 (!) 23  Temp:   98.7 F (37.1 C) 99.2 F (37.3 C)  TempSrc:   Oral Oral  SpO2: 98% 96% 96% 96%  Weight:    65.6 kg  Height:        Intake/Output Summary (Last 24 hours) at 03/18/2019 0741 Last data filed at 03/18/2019 0600 Gross per 24 hour  Intake 2415.47 ml  Output 1000 ml  Net 1415.47 ml   Last 3 Weights 03/18/2019 03/17/2019 03/16/2019  Weight (lbs) 144 lb 10 oz 137 lb 139 lb 1.8 oz  Weight (kg) 65.6 kg 62.143 kg 63.1 kg      Telemetry    sinus - Personally Reviewed  ECG    NSR - Personally Reviewed  Physical Exam   GEN: No acute distress.   Neck: No JVD Cardiac: RRR, no murmurs, rubs, or gallops.  Respiratory: Clear to auscultation bilaterally. GI: Soft, nontender, non-distended  MS: No edema; No deformity. Neuro:  Nonfocal   Psych: Normal affect   Labs    High Sensitivity Troponin:  No results for input(s): TROPONINIHS in the last 720 hours.    Cardiac EnzymesNo results for input(s): TROPONINI in the last 168 hours. No results for input(s): TROPIPOC in the last 168 hours.   Chemistry Recent Labs  Lab 03/17/19 0609  03/17/19 1317 03/17/19 1349 03/18/19 0419  NA 132*   < > 135 134* 133*  K 5.4*   < > 4.6 5.0 4.9  CL 96*  --   --  99 97*  CO2 30  --   --   --  24  GLUCOSE 94   < > 102* 102* 108*  BUN 31*  --   --  26* 26*  CREATININE 1.15  --   --  1.00 1.21  CALCIUM 8.8*  --   --   --  8.3*  PROT 6.3*  --   --   --   --   ALBUMIN 3.0*  --   --   --   --   AST 21  --   --   --   --   ALT 17  --   --   --   --  ALKPHOS 58  --   --   --   --   BILITOT 0.4  --   --   --   --   GFRNONAA 58*  --   --   --  55*  GFRAA >60  --   --   --  >60  ANIONGAP 6  --   --   --  12   < > = values in this interval not displayed.     Hematology Recent Labs  Lab 03/17/19 0609  03/17/19 1317 03/17/19 1349 03/18/19 0419  WBC 7.1  --   --   --  8.2  RBC 3.87*  --   --   --  3.65*  HGB 11.1*   < > 9.9* 10.5* 10.6*  HCT 35.1*   < > 29.0* 31.0* 32.7*  MCV 90.7  --   --   --  89.6  MCH 28.7  --   --   --  29.0  MCHC 31.6  --   --   --  32.4  RDW 13.6  --   --   --  13.6  PLT 198  --   --   --  175   < > = values in this interval not displayed.    BNPNo results for input(s): BNP, PROBNP in the last 168 hours.   DDimer No results for input(s): DDIMER in the last 168 hours.   Radiology    Dg Chest 2 View  Result Date: 03/16/2019 CLINICAL DATA:  Preop for aortic stenosis. Ex-smoker. EXAM: CHEST - 2 VIEW COMPARISON:  CT 03/11/2019. Most recent chest radiograph 07/15/2018 FINDINGS: Hyperinflation. Osteopenia with lower thoracic vertebral body height loss, suboptimally evaluated. Artifact degradation posteriorly on the lateral view. Midline trachea. Moderate cardiomegaly. Tortuous thoracic aorta.  Atherosclerosis in the transverse aorta. No pleural effusion or pneumothorax. No congestive failure. Numerous leads and wires project over the chest. IMPRESSION: Cardiomegaly and hyperinflation, without acute disease. Aortic Atherosclerosis (ICD10-I70.0). Electronically Signed   By: Dan Perez M.D.   On: 03/16/2019 20:54   Dg Chest Port 1 View  Result Date: 03/17/2019 CLINICAL DATA:  Status post TAVR, history of aortic stenosis EXAM: PORTABLE CHEST 1 VIEW COMPARISON:  03/16/2019 FINDINGS: Low volume, rotated AP portable examination interval placement of aortic valve stent endograft, which projects in the vicinity of the aortic valve, although is in an unusual inferiorly tilted orientation, perhaps exaggerated by radiographic technique. Correlate with intraprocedural imaging and echocardiogram for proper placement. Unchanged cardiomegaly, aortic atherosclerosis, and chronic elevation of the left hemidiaphragm. No acute appearing airspace opacity IMPRESSION: Low volume, rotated AP portable examination interval placement of aortic valve stent endograft, which projects in the vicinity of the aortic valve, although is in an unusual inferiorly tilted orientation, perhaps exaggerated by radiographic technique. Correlate with intraprocedural imaging and echocardiogram for proper placement. Unchanged cardiomegaly, aortic atherosclerosis, and chronic elevation of the left hemidiaphragm. No acute appearing airspace opacity Electronically Signed   By: Dan Perez M.D.   On: 03/17/2019 16:59    Cardiac Studies    Patient Profile     85 y.o. male with severe AS  Assessment & Plan    1. Severe aortic stenosis: He is one day post TAVR with placement of a 29 mm Edwards Sapien 3 valve from the left femoral artery. Doing well this am. Groins without hematoma. He is in sinus. BP stable. Labs ok. He is now on ASA and Plavix.  -Continue ASA and Plavix -Echo today  shows normal LV systolic function and mean gradient of  9 mmHg across the aortic valve with no AI.   Will need home health nursing during transition over next few days. His daughters are involved. Dan Perez(Dan Perez [phone] is his legal guardian). We will d/c home today and will arrange one week follow up in the structural heart clinic.   For questions or updates, please contact [facility] Please consult [email or web address] for contact info under        Signed, Verne Carrowhristopher Calley Drenning, MD  03/18/2019, 7:41 AM

## 2019-03-19 DIAGNOSIS — I2510 Atherosclerotic heart disease of native coronary artery without angina pectoris: Secondary | ICD-10-CM

## 2019-03-19 MED ORDER — ATORVASTATIN CALCIUM 10 MG PO TABS
20.0000 mg | ORAL_TABLET | Freq: Every day | ORAL | Status: DC
  Administered 2019-03-19 – 2019-03-23 (×5): 20 mg via ORAL
  Filled 2019-03-19 (×5): qty 2

## 2019-03-19 MED FILL — Heparin Sodium (Porcine) Inj 1000 Unit/ML: INTRAMUSCULAR | Qty: 30 | Status: AC

## 2019-03-19 MED FILL — Magnesium Sulfate Inj 50%: INTRAMUSCULAR | Qty: 10 | Status: AC

## 2019-03-19 MED FILL — Potassium Chloride Inj 2 mEq/ML: INTRAVENOUS | Qty: 40 | Status: AC

## 2019-03-19 NOTE — Progress Notes (Signed)
Progress Note  Patient Name: Laurian BrimLawrence E Goerke Date of Encounter: 03/19/2019  Primary Cardiologist: Prentice DockerSuresh Koneswaran, MD   Subjective   No complaints. No events overnight. Awaiting SNF bed.   Inpatient Medications    Scheduled Meds: . amLODipine  5 mg Oral Daily  . aspirin  81 mg Oral Daily  . clopidogrel  75 mg Oral Q breakfast  . memantine  28 mg Oral QHS   And  . donepezil  10 mg Oral QHS  . feeding supplement (ENSURE ENLIVE)  237 mL Oral BID BM  . multivitamin with minerals  1 tablet Oral Daily  . polyethylene glycol  17 g Oral Daily  . sertraline  50 mg Oral Daily  . sodium chloride flush  3 mL Intravenous Q12H   Continuous Infusions: . sodium chloride    . cefUROXime (ZINACEF)  IV 1.5 g (03/18/19 2139)  . nitroGLYCERIN    . phenylephrine (NEO-SYNEPHRINE) Adult infusion     PRN Meds: sodium chloride, acetaminophen **OR** acetaminophen, metoprolol tartrate, morphine injection, ondansetron (ZOFRAN) IV, oxyCODONE, sodium chloride flush, traMADol   Vital Signs    Vitals:   03/18/19 2027 03/18/19 2200 03/19/19 0400 03/19/19 0416  BP:  (!) 144/69 138/65   Pulse: 78 71 60 64  Resp: (!) 22 (!) 23 16 (!) 23  Temp: 99 F (37.2 C)  98.1 F (36.7 C)   TempSrc: Oral  Oral   SpO2: 95% 96% 96% 97%  Weight:    65.2 kg  Height:        Intake/Output Summary (Last 24 hours) at 03/19/2019 0706 Last data filed at 03/19/2019 0416 Gross per 24 hour  Intake 443 ml  Output 1000 ml  Net -557 ml   Last 3 Weights 03/19/2019 03/18/2019 03/17/2019  Weight (lbs) 143 lb 11.8 oz 144 lb 10 oz 137 lb  Weight (kg) 65.2 kg 65.6 kg 62.143 kg      Telemetry    sinus - Personally Reviewed  ECG    No AM EKG - Personally Reviewed  Physical Exam   General: Thin elderly male, NAD HEENT: OP clear, mucus membranes moist  SKIN: warm, dry. No rashes. Neuro: No focal deficits  Musculoskeletal: Muscle strength 5/5 all ext  Psychiatric: Mood and affect normal  Neck: No JVD, no carotid  bruits, no thyromegaly, no lymphadenopathy.  Lungs:Clear bilaterally, no wheezes, rhonci, crackles Cardiovascular: Regular rate and rhythm. Soft systolic murmur.  Abdomen:Soft. Bowel sounds present. Non-tender.  Extremities: No lower extremity edema.    Labs    High Sensitivity Troponin:  No results for input(s): TROPONINIHS in the last 720 hours.    Cardiac EnzymesNo results for input(s): TROPONINI in the last 168 hours. No results for input(s): TROPIPOC in the last 168 hours.   Chemistry Recent Labs  Lab 03/17/19 0609  03/17/19 1317 03/17/19 1349 03/18/19 0419  NA 132*   < > 135 134* 133*  K 5.4*   < > 4.6 5.0 4.9  CL 96*  --   --  99 97*  CO2 30  --   --   --  24  GLUCOSE 94   < > 102* 102* 108*  BUN 31*  --   --  26* 26*  CREATININE 1.15  --   --  1.00 1.21  CALCIUM 8.8*  --   --   --  8.3*  PROT 6.3*  --   --   --   --   ALBUMIN 3.0*  --   --   --   --  AST 21  --   --   --   --   ALT 17  --   --   --   --   ALKPHOS 58  --   --   --   --   BILITOT 0.4  --   --   --   --   GFRNONAA 58*  --   --   --  55*  GFRAA >60  --   --   --  >60  ANIONGAP 6  --   --   --  12   < > = values in this interval not displayed.     Hematology Recent Labs  Lab 03/17/19 0609  03/17/19 1317 03/17/19 1349 03/18/19 0419  WBC 7.1  --   --   --  8.2  RBC 3.87*  --   --   --  3.65*  HGB 11.1*   < > 9.9* 10.5* 10.6*  HCT 35.1*   < > 29.0* 31.0* 32.7*  MCV 90.7  --   --   --  89.6  MCH 28.7  --   --   --  29.0  MCHC 31.6  --   --   --  32.4  RDW 13.6  --   --   --  13.6  PLT 198  --   --   --  175   < > = values in this interval not displayed.    BNPNo results for input(s): BNP, PROBNP in the last 168 hours.   DDimer No results for input(s): DDIMER in the last 168 hours.   Radiology    Dg Chest Port 1 View  Result Date: 03/17/2019 CLINICAL DATA:  Status post TAVR, history of aortic stenosis EXAM: PORTABLE CHEST 1 VIEW COMPARISON:  03/16/2019 FINDINGS: Low volume, rotated AP  portable examination interval placement of aortic valve stent endograft, which projects in the vicinity of the aortic valve, although is in an unusual inferiorly tilted orientation, perhaps exaggerated by radiographic technique. Correlate with intraprocedural imaging and echocardiogram for proper placement. Unchanged cardiomegaly, aortic atherosclerosis, and chronic elevation of the left hemidiaphragm. No acute appearing airspace opacity IMPRESSION: Low volume, rotated AP portable examination interval placement of aortic valve stent endograft, which projects in the vicinity of the aortic valve, although is in an unusual inferiorly tilted orientation, perhaps exaggerated by radiographic technique. Correlate with intraprocedural imaging and echocardiogram for proper placement. Unchanged cardiomegaly, aortic atherosclerosis, and chronic elevation of the left hemidiaphragm. No acute appearing airspace opacity Electronically Signed   By: Lauralyn PrimesAlex  Bibbey M.D.   On: 03/17/2019 16:59    Cardiac Studies   Echo 02/16/19:   1. The left ventricle has normal systolic function with an ejection fraction of 60-65%. The cavity size was normal. There is mildly increased left ventricular wall thickness. Left ventricular diastolic Doppler parameters are consistent with impaired  relaxation. No evidence of left ventricular regional wall motion abnormalities.  2. The right ventricle has normal systolic function. The cavity was normal. There is no increase in right ventricular wall thickness.  3. Mild calcification of the mitral valve leaflet. There is mild mitral annular calcification present. No evidence of mitral valve stenosis. Mild mitral regurgitation.  4. Bioprosthetic aortic valve s/p TAVR. Mean gradient 9 mmHg with AVA 1.33 cm^2. No significant peri-valvular regurgitation noted.  5. The aortic root is normal in size and structure.  6. Normal IVC size. No complete TR doppler jet so unable to estimate PA systolic pressure.  Patient Profile     85 y.o. male with severe AS now s/p TAVR. He was admitted with syncope 03/06/19 and was found to have severe AS . Mild CAD by cath.   Assessment & Plan    1. Severe aortic stenosis: He is 2 days post TAVR with placement of a 29 mm Sapien 3 valve from the right transfemoral approach. Echo 02/16/19 with normally functioning bioprosthetic AVR with no PVL. He is doing well today. Groins without hematoma. Will continue ASA and Plavix for at least six months.  Appreciate PT/OT input. Pt will require SNF placement. His daughters are involved. [name] [phone] is his legal guardian).   2. CAD without angina: Continue ASA. Will start a statin today.   Await SNF placement.  No active cardiac issues.   For questions or updates, please contact [place] Please consult [email or web address] for contact info under        Signed, Lauree Chandler, MD  03/19/2019, 7:06 AM

## 2019-03-19 NOTE — Progress Notes (Signed)
CARDIAC REHAB PHASE I   Attempted to walk pt around 11:30, pt states he is eating lunch. Returned recently to attempt to walk with pt. Pt states he is still eating his lunch. Reinforced site care and restrictions. Encouraged to ambulate as able. Pt waiting for SNF placement.  12:53-13:05 Rufina Falco, RN BSN 03/19/2019 1:04 PM

## 2019-03-19 NOTE — TOC Progression Note (Signed)
Transition of Care [facility]) - Progression Note    Patient Details  Name: Dan Perez MRN: [id number] Date of Birth: [birth year]  Transition of Care [facility]) CM/SW [place], [place] Phone Number: 03/19/2019, 4:43 PM  Clinical Narrative:     Patient is ready to discharge to [facility] tomorrow, if medically stable.Marland Kitchen HTA has approved SNF auth # [id number].   CSW has confirmed with SNF patient will d/c tomorrow.  Thurmond Butts, MSW, [facility] Clinical Social Worker [phone]   Expected Discharge Plan: Skilled Nursing Facility Barriers to Discharge: Insurance Authorization, SNF Pending bed offer  Expected Discharge Plan and Services Expected Discharge Plan: [place] Choice: [place] arrangements for the past 2 months: Single Family Home                                       Social Determinants of Health (SDOH) Interventions    Readmission Risk Interventions No flowsheet data found.

## 2019-03-20 LAB — BASIC METABOLIC PANEL
Anion gap: 7 (ref 5–15)
BUN: 29 mg/dL — ABNORMAL HIGH (ref 8–23)
CO2: 27 mmol/L (ref 22–32)
Calcium: 8.3 mg/dL — ABNORMAL LOW (ref 8.9–10.3)
Chloride: 96 mmol/L — ABNORMAL LOW (ref 98–111)
Creatinine, Ser: 1.23 mg/dL (ref 0.61–1.24)
GFR calc Af Amer: >60 mL/min (ref >60)
GFR calc non Af Amer: 54 mL/min — ABNORMAL LOW (ref >60)
Glucose, Bld: 128 mg/dL — ABNORMAL HIGH (ref 70–99)
Potassium: 4.6 mmol/L (ref 3.5–5.1)
Sodium: 130 mmol/L — ABNORMAL LOW (ref 135–145)

## 2019-03-20 LAB — CBC
HCT: 28.6 % — ABNORMAL LOW (ref 39.0–52.0)
Hemoglobin: 9.2 g/dL — ABNORMAL LOW (ref 13.0–17.0)
MCH: 28.9 pg (ref 26.0–34.0)
MCHC: 32.2 g/dL (ref 30.0–36.0)
MCV: 89.9 fL (ref 80.0–100.0)
Platelets: 157 10*3/uL (ref 150–400)
RBC: 3.18 MIL/uL — ABNORMAL LOW (ref 4.22–5.81)
RDW: 13.6 % (ref 11.5–15.5)
WBC: 8.5 10*3/uL (ref 4.0–10.5)
nRBC: 0 % (ref 0.0–0.2)

## 2019-03-20 MED ORDER — ATORVASTATIN CALCIUM 20 MG PO TABS: 20.0000 mg | ORAL_TABLET | Freq: Every day | ORAL | 3 refills | Status: AC

## 2019-03-20 NOTE — TOC Progression Note (Signed)
Transition of Care [facility]) - Progression Note    Patient Details  Name: CODA MATHEY MRN: [id number] Date of Birth: Mar 24, 1932  Transition of Care [facility]) CM/SW [place], [place] Phone Number: 03/20/2019, 2:19 PM  Clinical Narrative:   Patient was set for discharged but spiked a fever of 100.5. CSW called and informed the SNF. SNF advised the atient has to be 48 hrs free of temperature with no medication before he can discharge to SNF. Patient will need another Covid Test- last one done 03/18/2019.  Thurmond Butts, MSW, [facility] Clinical Social Worker [phone]    Expected Discharge Plan: Skilled Nursing Facility Barriers to Discharge: Barriers Resolved  Expected Discharge Plan and Services Expected Discharge Plan: [place] Choice: [place] arrangements for the past 2 months: Single Family Home Expected Discharge Date: 03/20/19                                     Social Determinants of Health (SDOH) Interventions    Readmission Risk Interventions No flowsheet data found.

## 2019-03-20 NOTE — Progress Notes (Signed)
Dr Caryl Comes aware that Pt unable to DC to SNF d/t afternoon low grade fever.  Pts daughter also aware.  Pt remains afebrile at this time.  Will con't to monitor.

## 2019-03-20 NOTE — Progress Notes (Signed)
PT Cancellation Note  Patient Details Name: HERBERTO LEDWELL MRN: [id number] DOB: 1932/08/20   Cancelled Treatment:    Reason Eval/Treat Not Completed: Other (comment)(Pt eating lunch.  Will check back as able. )   Denice Paradise 03/20/2019, 12:12 PM  [place] Pager:  [phone]  Office:  [phone]

## 2019-03-20 NOTE — Care Management Important Message (Signed)
Important Message  Patient Details  Name: NALIN MAZZOCCO MRN: [id number] Date of Birth: 04-10-1932   Medicare Important Message Given:  Yes     Shelda Altes 03/20/2019, 1:20 PM

## 2019-03-20 NOTE — Progress Notes (Signed)
Physical Therapy Treatment Patient Details Name: Dan Perez MRN: [id number] DOB: 10/15/1931 Today's Date: 03/20/2019    History of Present Illness 85 y/o m with a h/o progressive and now severe aortic stenosis, moderate MR, HTN, CKD III, and coronary calcium, tranferred from [facility] for admission following syncope.Admitted for evaluation of AS. s/p cardiac cath 6/23. Underwent TAVR 6/30.      PT Comments    Patient seen for mobility progression. Pt requires min guard/min A for gait and mod A for sit to stand transfers this session. Pt tolerated mobility well. Continue to progress as tolerated with anticipated d/c to SNF for further skilled PT services.     Follow Up Recommendations  SNF;Supervision/Assistance - 24 hour     Equipment Recommendations  Rolling walker with 5" wheels    Recommendations for Other Services       Precautions / Restrictions Precautions Precautions: Fall Precaution Comments: hx of syncope with fall Restrictions Weight Bearing Restrictions: No    Mobility  Bed Mobility Overal bed mobility: Needs Assistance Bed Mobility: Supine to Sit     Supine to sit: Min guard     General bed mobility comments: min guard for safety; increased time and effort needed; use of rail  Transfers Overall transfer level: Needs assistance Equipment used: Rolling walker (2 wheeled) Transfers: Sit to/from Stand Sit to Stand: Mod assist         General transfer comment: assist to power up into standing and to steady; cues for safe hand placement  Ambulation/Gait Ambulation/Gait assistance: Min guard;Min assist Gait Distance (Feet): 100 Feet Assistive device: Rolling walker (2 wheeled) Gait Pattern/deviations: Step-through pattern;Trunk flexed;Decreased stride length;Drifts right/left;Narrow base of support Gait velocity: decreased   General Gait Details: assist to steady; multimodal cues for upright posture and maintaining safe proximity to [employer]              Wheelchair Mobility    Modified Rankin (Stroke Patients Only)       Balance Overall balance assessment: Needs assistance Sitting-balance support: No upper extremity supported;Feet supported Sitting balance-Leahy Scale: Good     Standing balance support: Bilateral upper extremity supported;Single extremity supported;During functional activity Standing balance-Leahy Scale: Poor                              Cognition Arousal/Alertness: Awake/alert Behavior During Therapy: WFL for tasks assessed/performed Overall Cognitive Status: No family/caregiver present to determine baseline cognitive functioning Area of Impairment: Problem solving;Safety/judgement;Memory;Following commands;Awareness                     Memory: Decreased short-term memory Following Commands: Follows one step commands consistently;Follows one step commands with increased time Safety/Judgement: Decreased awareness of safety;Decreased awareness of deficits Awareness: Intellectual Problem Solving: Slow processing;Difficulty sequencing;Requires verbal cues        Exercises      General Comments General comments (skin integrity, edema, etc.): HR up to 107 bpm and SpO2 97% on RA       Pertinent Vitals/Pain Pain Assessment: No/denies pain    Home Living                      Prior Function            PT Goals (current goals can now be found in the care plan section) Progress towards PT goals: Progressing toward goals    Frequency    Min  3X/week      PT Plan Current plan remains appropriate    Co-evaluation              AM-PAC PT "6 Clicks" Mobility   Outcome Measure  Help needed turning from your back to your side while in a flat bed without using bedrails?: None Help needed moving from lying on your back to sitting on the side of a flat bed without using bedrails?: A Little Help needed moving to and from a bed to a chair (including a  wheelchair)?: A Little Help needed standing up from a chair using your arms (e.g., wheelchair or bedside chair)?: A Lot Help needed to walk in hospital room?: A Little Help needed climbing 3-5 steps with a railing? : A Lot 6 Click Score: 17    End of Session Equipment Utilized During Treatment: Gait belt Activity Tolerance: Patient tolerated treatment well Patient left: in chair;with call bell/phone within reach Nurse Communication: Mobility status PT Visit Diagnosis: Unsteadiness on feet (R26.81);Other abnormalities of gait and mobility (R26.89);Muscle weakness (generalized) (M62.81);History of falling (Z91.81);Difficulty in walking, not elsewhere classified (R26.2)     Time: [id number] PT Time Calculation (min) (ACUTE ONLY): 27 min  Charges:  $Gait Training: 23-37 mins                     Erline LevineKellyn Keri Veale, PTA Acute Rehabilitation Services Pager: [phone] Office: [phone]) [phone]     Carolynne EdouardKellyn R Ozie Lupe 03/20/2019, 4:01 PM

## 2019-03-20 NOTE — Progress Notes (Signed)
Progress Note  Patient Name: Dan Perez Date of Encounter: 03/20/2019  Primary Cardiologist: Prentice DockerSuresh Koneswaran, MD   Subjective   No chest pain or dyspnea. No events.   Inpatient Medications    Scheduled Meds: . amLODipine  5 mg Oral Daily  . aspirin  81 mg Oral Daily  . atorvastatin  20 mg Oral q1800  . clopidogrel  75 mg Oral Q breakfast  . memantine  28 mg Oral QHS   And  . donepezil  10 mg Oral QHS  . feeding supplement (ENSURE ENLIVE)  237 mL Oral BID BM  . multivitamin with minerals  1 tablet Oral Daily  . polyethylene glycol  17 g Oral Daily  . sertraline  50 mg Oral Daily  . sodium chloride flush  3 mL Intravenous Q12H   Continuous Infusions: . sodium chloride    . nitroGLYCERIN    . phenylephrine (NEO-SYNEPHRINE) Adult infusion     PRN Meds: sodium chloride, acetaminophen **OR** acetaminophen, metoprolol tartrate, morphine injection, ondansetron (ZOFRAN) IV, oxyCODONE, sodium chloride flush, traMADol   Vital Signs    Vitals:   03/19/19 1301 03/19/19 2007 03/20/19 0342 03/20/19 0347  BP: 136/62 (!) 117/51 (!) 141/84   Pulse: 84 82 65 64  Resp: 20 15 18 17   Temp: 97.7 F (36.5 C) 99.3 F (37.4 C) 99.1 F (37.3 C)   TempSrc: Oral Oral Oral   SpO2: 95% 91% 94% 92%  Weight:    66.2 kg  Height:        Intake/Output Summary (Last 24 hours) at 03/20/2019 0722 Last data filed at 03/20/2019 0348 Gross per 24 hour  Intake 600 ml  Output 1700 ml  Net -1100 ml   Last 3 Weights 03/20/2019 03/19/2019 03/18/2019  Weight (lbs) 145 lb 15.1 oz 143 lb 11.8 oz 144 lb 10 oz  Weight (kg) 66.2 kg 65.2 kg 65.6 kg      Telemetry    sinus - Personally Reviewed  ECG    No AM EKG - Personally Reviewed  Physical Exam   General: thin elderly male, NAD HEENT: OP clear, mucus membranes moist  SKIN: warm, dry. No rashes. Neuro: No focal deficits  Musculoskeletal: Muscle strength 5/5 all ext  Psychiatric: Mood and affect normal  Neck: No JVD, no carotid bruits, no  thyromegaly, no lymphadenopathy.  Lungs:Clear bilaterally, no wheezes, rhonci, crackles Cardiovascular: Regular rate and rhythm. Soft systolic murmur.  Abdomen:Soft. Bowel sounds present. Non-tender.  Extremities: No lower extremity edema.    Labs    High Sensitivity Troponin:  No results for input(s): TROPONINIHS in the last 720 hours.    Cardiac EnzymesNo results for input(s): TROPONINI in the last 168 hours. No results for input(s): TROPIPOC in the last 168 hours.   Chemistry Recent Labs  Lab 03/17/19 0609  03/17/19 1317 03/17/19 1349 03/18/19 0419  NA 132*   < > 135 134* 133*  K 5.4*   < > 4.6 5.0 4.9  CL 96*  --   --  99 97*  CO2 30  --   --   --  24  GLUCOSE 94   < > 102* 102* 108*  BUN 31*  --   --  26* 26*  CREATININE 1.15  --   --  1.00 1.21  CALCIUM 8.8*  --   --   --  8.3*  PROT 6.3*  --   --   --   --   ALBUMIN 3.0*  --   --   --   --  AST 21  --   --   --   --   ALT 17  --   --   --   --   ALKPHOS 58  --   --   --   --   BILITOT 0.4  --   --   --   --   GFRNONAA 58*  --   --   --  55*  GFRAA >60  --   --   --  >60  ANIONGAP 6  --   --   --  12   < > = values in this interval not displayed.     Hematology Recent Labs  Lab 03/17/19 [phone]  03/17/19 1349 03/18/19 0419 03/20/19 0038  WBC 7.1  --   --  8.2 8.5  RBC 3.87*  --   --  3.65* 3.18*  HGB 11.1*   < > 10.5* 10.6* 9.2*  HCT 35.1*   < > 31.0* 32.7* 28.6*  MCV 90.7  --   --  89.6 89.9  MCH 28.7  --   --  29.0 28.9  MCHC 31.6  --   --  32.4 32.2  RDW 13.6  --   --  13.6 13.6  PLT 198  --   --  175 157   < > = values in this interval not displayed.    BNPNo results for input(s): BNP, PROBNP in the last 168 hours.   DDimer No results for input(s): DDIMER in the last 168 hours.   Radiology    No results found.  Cardiac Studies   Echo 02/16/19:   1. The left ventricle has normal systolic function with an ejection fraction of 60-65%. The cavity size was normal. There is mildly increased left  ventricular wall thickness. Left ventricular diastolic Doppler parameters are consistent with impaired  relaxation. No evidence of left ventricular regional wall motion abnormalities.  2. The right ventricle has normal systolic function. The cavity was normal. There is no increase in right ventricular wall thickness.  3. Mild calcification of the mitral valve leaflet. There is mild mitral annular calcification present. No evidence of mitral valve stenosis. Mild mitral regurgitation.  4. Bioprosthetic aortic valve s/p TAVR. Mean gradient 9 mmHg with AVA 1.33 cm^2. No significant peri-valvular regurgitation noted.  5. The aortic root is normal in size and structure.  6. Normal IVC size. No complete TR doppler jet so unable to estimate PA systolic pressure.   Patient Profile     85 y.o. male with severe AS now s/p TAVR. He was admitted with syncope 03/06/19 and was found to have severe AS . Mild CAD by cath.   Assessment & Plan    1. Severe aortic stenosis: 3 days post TAVR with placement of a 29 mm Sapien 3 valve from the right transfemoral approach. Echo 02/16/19 with normally functioning bioprosthetic AVR with no PVL. No issues overnight. He has been awaiting SNF placement. Bed is available. D/C to SNF today with plan for ASA and Plavix for 6 months. His daughters are involved. [name] [phone] is his legal guardian). I spoke to her this am   2. CAD without angina: Continue ASA and statin.   D/c to SNF today. We have arranged structural heart clinic follow up.   For questions or updates, please contact [place] Please consult [email or web address] for contact info under        Signed, Lauree Chandler, MD  03/20/2019, 7:22 AM

## 2019-03-21 DIAGNOSIS — R509 Fever, unspecified: Secondary | ICD-10-CM

## 2019-03-21 LAB — CBC
HCT: 30.6 % — ABNORMAL LOW (ref 39.0–52.0)
Hemoglobin: 10.2 g/dL — ABNORMAL LOW (ref 13.0–17.0)
MCH: 29.4 pg (ref 26.0–34.0)
MCHC: 33.3 g/dL (ref 30.0–36.0)
MCV: 88.2 fL (ref 80.0–100.0)
Platelets: 166 10*3/uL (ref 150–400)
RBC: 3.47 MIL/uL — ABNORMAL LOW (ref 4.22–5.81)
RDW: 13.6 % (ref 11.5–15.5)
WBC: 7.9 10*3/uL (ref 4.0–10.5)
nRBC: 0 % (ref 0.0–0.2)

## 2019-03-21 NOTE — Discharge Summary (Signed)
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 He has remained hospitalized since awaiting SNF.  Hospital Course     Consultants: Dr Cornelius Moras  See DC summary from 03/18/2019- Pt has been stable for discharge since 03/18/2019 but has been kept in the hospital awaiting SNF placement. He did have one low grade Temp of less than 101.  WBC done this am was WNL.  Dr Jens Som feel he is stable for DC to SNF today.  _____________  Discharge Vitals Blood pressure 123/72, pulse 64, temperature 98 F (36.7 C), temperature source Oral, resp. rate 19, height 6' (1.829 m), weight 66.2 kg, SpO2 96 %.  Filed Weights   03/18/19 0534 03/19/19 0416 03/20/19 0347  Weight: 65.6 kg 65.2 kg 66.2 kg    Labs & Radiologic Studies    CBC Recent Labs    03/20/19 0038 03/21/19 1014  WBC 8.5 7.9  HGB 9.2* 10.2*  HCT 28.6* 30.6*  MCV 89.9 88.2  PLT 157 166   Basic Metabolic Panel Recent Labs    40/98/11 1053  NA 130*  K 4.6  CL 96*  CO2 27  GLUCOSE 128*  BUN 29*  CREATININE 1.23  CALCIUM 8.3*   Liver Function Tests No results for input(s): AST, ALT, ALKPHOS, BILITOT, PROT, ALBUMIN in the  last 72 hours. No results for input(s): LIPASE, AMYLASE in the last 72 hours. Cardiac Enzymes No results for input(s): CKTOTAL, CKMB, CKMBINDEX, TROPONINI in the last 72 hours. BNP Invalid input(s): POCBNP D-Dimer No results for input(s): DDIMER in the last 72 hours. Hemoglobin A1C No results for input(s): HGBA1C in the last 72 hours. Fasting Lipid Panel No results for input(s): CHOL, HDL, LDLCALC, TRIG, CHOLHDL, LDLDIRECT in the last 72 hours. Thyroid Function Tests No results for input(s): TSH, T4TOTAL, T3FREE, THYROIDAB in the last 72 hours.  Invalid input(s): FREET3 _____________  Dg Chest 2 View  Result Date: 03/16/2019 CLINICAL DATA:  Preop for aortic stenosis. Ex-smoker. EXAM: CHEST - 2 VIEW COMPARISON:  CT 03/11/2019. Most recent chest radiograph 07/15/2018 FINDINGS: Hyperinflation. Osteopenia with lower thoracic vertebral body height loss, suboptimally evaluated. Artifact degradation posteriorly on the lateral view. Midline trachea. Moderate cardiomegaly. Tortuous thoracic aorta. Atherosclerosis in the transverse aorta. No pleural effusion or pneumothorax. No congestive failure. Numerous leads and wires project over the chest. IMPRESSION: Cardiomegaly and hyperinflation, without acute disease. Aortic Atherosclerosis (ICD10-I70.0). Electronically Signed   By: Jeronimo Greaves M.D.   On: 03/16/2019 20:54   Ct Coronary Morph W/cta Cor W/score W/ca W/cm &/or Wo/cm  Addendum Date: 03/11/2019   ADDENDUM REPORT: 03/11/2019 12:48 CLINICAL DATA:  85 year old male with severe aortic stenosis being evaluated for a TAVR procedure. EXAM: Cardiac TAVR CT TECHNIQUE: The patient was scanned  on a [employer] scanner. A 120 kV retrospective scan was triggered in the descending thoracic aorta at 111 HU's. Gantry rotation speed was 250 msecs and collimation was .6 mm. No beta blockade or nitro were given. The 3D data set was reconstructed in 5% intervals of the R-R cycle. Systolic and diastolic phases  were analyzed on a dedicated work station using MPR, MIP and VRT modes. The patient received 80 cc of contrast. FINDINGS: Aortic Valve: Trileaflet aortic valve with severely thickened and calcified leaflets and severely restricted leaflets opening and mild asymmetric calcifications extending into the LVOT under the left coronary sinus. Aorta: Normal size with moderate diffuse atherosclerotic plaque and calcifications. No dissection. Tortuous course of the abdominal aorta. Sinotubular Junction: 33 x 33 mm Ascending Thoracic Aorta: 39 x 38 mm Aortic Arch: 33 x 29 mm Descending Thoracic Aorta: 29 x 27 mm Sinus of Valsalva Measurements: Non-coronary: 37 mm Right -coronary: 36 mm Left -coronary: 36 mm Coronary Artery Height above Annulus: Left Main: 19 mm Right Coronary: 17 mm Virtual Basal Annulus Measurements: Maximum/Minimum Diameter: 30.6 x 25.2 mm Mean Diameter: 26.7 mm Perimeter: 86 mm Area: 558 mm2 Optimum Fluoroscopic Angle for Delivery: RAO 4 CAU 43 IMPRESSION: 1. Trileaflet aortic valve with severely thickened and calcified leaflets and severely restricted leaflets opening and mild asymmetric calcifications extending into the LVOT under the left coronary sinus. Annular measurements suitable for delivery of a 29 mm Edwards-SAPIEN 3 valve. Aortic valve calcium score 5172 consistent with severe aortic stenosis. 2. Sufficient coronary to annulus distance. 3. Optimum Fluoroscopic Angle for Delivery:  RAO 4 CAU 43. 4. No thrombus in the left atrial appendage. Electronically Signed   By: Tobias AlexanderKatarina  Nelson   On: 03/11/2019 12:48   Result Date: 03/11/2019 EXAM: OVER-READ INTERPRETATION  CT CHEST The following report is an over-read performed by radiologist Dr. Trudie Reedaniel Entrikin of [facility] on 03/11/2019. This over-read does not include interpretation of cardiac or coronary anatomy or pathology. The coronary calcium score/coronary CTA interpretation by the cardiologist is attached. COMPARISON:  Chest CT  01/30/2018. FINDINGS: Extracardiac findings will be described separately under dictation for contemporaneously obtained CTA chest, abdomen and pelvis. IMPRESSION: Please see separate dictation for contemporaneously obtained CTA chest, abdomen and pelvis dated 03/11/2019 for full description of relevant extracardiac findings. Electronically Signed: By: Trudie Reedaniel  Entrikin M.D. On: 03/11/2019 11:30   Dg Chest Port 1 View  Result Date: 03/17/2019 CLINICAL DATA:  Status post TAVR, history of aortic stenosis EXAM: PORTABLE CHEST 1 VIEW COMPARISON:  03/16/2019 FINDINGS: Low volume, rotated AP portable examination interval placement of aortic valve stent endograft, which projects in the vicinity of the aortic valve, although is in an unusual inferiorly tilted orientation, perhaps exaggerated by radiographic technique. Correlate with intraprocedural imaging and echocardiogram for proper placement. Unchanged cardiomegaly, aortic atherosclerosis, and chronic elevation of the left hemidiaphragm. No acute appearing airspace opacity IMPRESSION: Low volume, rotated AP portable examination interval placement of aortic valve stent endograft, which projects in the vicinity of the aortic valve, although is in an unusual inferiorly tilted orientation, perhaps exaggerated by radiographic technique. Correlate with intraprocedural imaging and echocardiogram for proper placement. Unchanged cardiomegaly, aortic atherosclerosis, and chronic elevation of the left hemidiaphragm. No acute appearing airspace opacity Electronically Signed   By: Lauralyn PrimesAlex  Bibbey M.D.   On: 03/17/2019 16:59   Ct Angio Chest Aorta W/cm &/or Wo/cm  Result Date: 03/11/2019 CLINICAL DATA:  85 year old male with history of severe aortic stenosis. Preprocedural study prior to potential transcatheter aortic  valve replacement (TAVR) procedure. EXAM: CT ANGIOGRAPHY CHEST, ABDOMEN AND PELVIS TECHNIQUE: Multidetector CT imaging through the chest, abdomen and pelvis was  performed using the standard protocol during bolus administration of intravenous contrast. Multiplanar reconstructed images and MIPs were obtained and reviewed to evaluate the vascular anatomy. CONTRAST:  OMNIPAQUE IOHEXOL 350 MG/ML SOLN COMPARISON:  Chest CT 01/30/2018. CT the abdomen and pelvis 12/03/2016. FINDINGS: CTA CHEST FINDINGS Cardiovascular: Heart size is normal. Small amount of pericardial fluid and/or thickening, unlikely to be of any hemodynamic significance at this time. No associated pericardial calcification. There is aortic atherosclerosis, as well as atherosclerosis of the great vessels of the mediastinum and the coronary arteries, including calcified atherosclerotic plaque in the left main and left anterior descending coronary arteries. Ectasia of ascending thoracic aorta (4.0 cm in diameter). Severe thickening calcification of the aortic valve. Mediastinum/Lymph Nodes: No pathologically enlarged mediastinal or hilar lymph nodes. Esophagus is unremarkable in appearance. No axillary lymphadenopathy. Lungs/Pleura: Several small pulmonary nodules are noted in the right lung, largest of which is in the periphery of the right lower lobe (axial image 61 of series 17) measuring 10 x 8 mm (mean diameter of 9 mm). No acute consolidative airspace disease. No pleural effusions. Musculoskeletal/Soft Tissues: Chronic compression fracture of T8 with 90% loss of anterior vertebral body height. New compression fracture of T12 with 40% loss of anterior vertebral body height, which does not appear acute. There are no aggressive appearing lytic or blastic lesions noted in the visualized portions of the skeleton. CTA ABDOMEN AND PELVIS FINDINGS Hepatobiliary: No suspicious cystic or solid hepatic lesions. No intra or extrahepatic biliary ductal dilatation. Gallbladder is normal in appearance. Pancreas: No pancreatic mass. No pancreatic ductal dilatation. No pancreatic or peripancreatic fluid or inflammatory  changes. Spleen: Unremarkable. Adrenals/Urinary Tract: Mild diffuse cortical atrophy in the left kidney. Right kidney and adrenal glands are normal in appearance. No hydroureteronephrosis. Urinary bladder is normal in appearance. Stomach/Bowel: Normal appearance of the stomach. No pathologic dilatation of small bowel or colon. Numerous colonic diverticulae are noted, without surrounding inflammatory changes to suggest an acute diverticulitis at this time. The appendix is not confidently identified and may be surgically absent. Regardless, there are no inflammatory changes noted adjacent to the cecum to suggest the presence of an acute appendicitis at this time. Vascular/Lymphatic: Aortic atherosclerosis with fusiform aneurysmal dilatation of the infrarenal abdominal aorta which measures up to 3.7 x 4.0 cm. Vascular findings and measurements pertinent to potential TAVR procedure, as detailed below. No lymphadenopathy noted in the abdomen or pelvis. Reproductive: Prostate gland and seminal vesicles are unremarkable in appearance. Other: No significant volume of ascites.  No pneumoperitoneum. Musculoskeletal: Chronic compression fracture of L4 with 30% loss of anterior vertebral body height. There are no aggressive appearing lytic or blastic lesions noted in the visualized portions of the skeleton. VASCULAR MEASUREMENTS PERTINENT TO TAVR: AORTA: Minimal Aortic Diameter-20 x 14 mm Severity of Aortic Calcification-severe RIGHT PELVIS: Right Common Iliac Artery - Minimal Diameter-10.3 x 8.3 mm Tortuosity-moderate to severe Calcification-moderate Right External Iliac Artery - Minimal Diameter-8.3 x 7.2 mm Tortuosity-mild-to-moderate Calcification-mild Right Common Femoral Artery - Minimal Diameter-8.3 x 8.3 mm Tortuosity-mild Calcification-mild LEFT PELVIS: Left Common Iliac Artery - Minimal Diameter-8.4 x 8.6 mm Tortuosity-mild Calcification-moderate Left External Iliac Artery - Minimal Diameter-7.7 x 7.9 mm  Tortuosity-mild Calcification-mild Left Common Femoral Artery - Minimal Diameter-8.2 x 8.0 mm Tortuosity-mild Calcification-moderate Review of the MIP images confirms the above findings. IMPRESSION: 1. Vascular findings and measurements pertinent to potential TAVR  procedure, as detailed above. 2. Severe thickening calcification of the aortic valve, compatible with the reported clinical history of aortic stenosis. 3. Aortic atherosclerosis, in addition to left main and left anterior descending coronary artery disease. There is also ectasia of the ascending thoracic aorta (4.0 cm in diameter), as well as fusiform aneurysmal dilatation of the infrarenal abdominal aorta (3.7 x 4.0 cm). Recommend followup by ultrasound in 2 years. This recommendation follows ACR consensus guidelines: White Paper of the ACR Incidental Findings Committee II on Vascular Findings. J Am Coll Radiol 2013; 10:789-794. 4. Multiple small pulmonary nodules in the right lung, largest of which measures up to a mean diameter of 9 mm in the right lower lobe. Non-contrast chest CT at 3-6 months is recommended. If the nodules are stable at time of repeat CT, then future CT at 18-24 months (from today's scan) is considered optional for low-risk patients, but is recommended for high-risk patients. This recommendation follows the consensus statement: Guidelines for Management of Incidental Pulmonary Nodules Detected on CT Images: From the Fleischner Society 2017; Radiology 2017; 284:228-243. 5. Colonic diverticulosis without evidence of acute diverticulitis at this time. 6. Additional incidental findings, as above. Electronically Signed   By: Trudie Reedaniel  Entrikin M.D.   On: 03/11/2019 12:27   [place] Carotid  Result Date: 03/11/2019 Carotid Arterial Duplex Study Indications:       Pre-TAVR. Other Factors:     Aortic stenosis. Limitations:       Patient body habitus, patient anatomy, patient movement Comparison Study:  No prior studies. Performing  Technologist: Chanda BusingGregory Collins RVT  Examination Guidelines: A complete evaluation includes B-mode imaging, spectral Doppler, color Doppler, and power Doppler as needed of all accessible portions of each vessel. Bilateral testing is considered an integral part of a complete examination. Limited examinations for reoccurring indications may be performed as noted.  Right Carotid Findings: +----------+--------+--------+--------+-----------------------+--------+             PSV cm/s EDV cm/s Stenosis Describe                Comments  +----------+--------+--------+--------+-----------------------+--------+  CCA Prox   72       10                                        tortuous  +----------+--------+--------+--------+-----------------------+--------+  CCA Distal 71       12                smooth and heterogenous           +----------+--------+--------+--------+-----------------------+--------+  ICA Prox   60       15                smooth and heterogenous           +----------+--------+--------+--------+-----------------------+--------+  ICA Distal 77       17                                                  +----------+--------+--------+--------+-----------------------+--------+  ECA        56       0                                                   +----------+--------+--------+--------+-----------------------+--------+ +----------+--------+-------+--------+-------------------+  PSV cm/s EDV cms Describe Arm Pressure (mmHG)  +----------+--------+-------+--------+-------------------+  Subclavian 88                                             +----------+--------+-------+--------+-------------------+ +---------+--------+--+--------+-+---------+  Vertebral PSV cm/s 30 EDV cm/s 6 Antegrade  +---------+--------+--+--------+-+---------+  Left Carotid Findings: +----------+--------+-------+--------+--------------------------------+--------+             PSV cm/s EDV     Stenosis Describe                          Comments                       cm/s                                                        +----------+--------+-------+--------+--------------------------------+--------+  CCA Prox   67       12               smooth and heterogenous          tortuous  +----------+--------+-------+--------+--------------------------------+--------+  CCA Distal 77       19               smooth and heterogenous                    +----------+--------+-------+--------+--------------------------------+--------+  ICA Prox   64       16               smooth, heterogenous and                                                         calcific                                   +----------+--------+-------+--------+--------------------------------+--------+  ICA Distal 69       20                                                          +----------+--------+-------+--------+--------------------------------+--------+  ECA        85       9                                                           +----------+--------+-------+--------+--------------------------------+--------+ +----------+--------+--------+--------+-------------------+  Subclavian PSV cm/s EDV cm/s Describe Arm Pressure (mmHG)  +----------+--------+--------+--------+-------------------+             91                                              +----------+--------+--------+--------+-------------------+ +---------+--------+--+--------+-+---------+  Vertebral PSV cm/s 39 EDV cm/s 8 Antegrade  +---------+--------+--+--------+-+---------+  Summary: Right Carotid: Velocities in the right ICA are consistent with a 1-39% stenosis. Left Carotid: Velocities in the left ICA are consistent with a 1-39% stenosis. Vertebrals: Bilateral vertebral arteries demonstrate antegrade flow. *See table(s) above for measurements and observations.  Electronically signed by Deitra Mayo MD on 03/11/2019 at 6:29:41 AM.    Final    Ct Angio Abd/pel W/ And/or W/o  Result Date:  03/11/2019 CLINICAL DATA:  85 year old male with history of severe aortic stenosis. Preprocedural study prior to potential transcatheter aortic valve replacement (TAVR) procedure. EXAM: CT ANGIOGRAPHY CHEST, ABDOMEN AND PELVIS TECHNIQUE: Multidetector CT imaging through the chest, abdomen and pelvis was performed using the standard protocol during bolus administration of intravenous contrast. Multiplanar reconstructed images and MIPs were obtained and reviewed to evaluate the vascular anatomy. CONTRAST:  133mL OMNIPAQUE IOHEXOL 350 MG/ML SOLN COMPARISON:  Chest CT 01/30/2018. CT the abdomen and pelvis 12/03/2016. FINDINGS: CTA CHEST FINDINGS Cardiovascular: Heart size is normal. Small amount of pericardial fluid and/or thickening, unlikely to be of any hemodynamic significance at this time. No associated pericardial calcification. There is aortic atherosclerosis, as well as atherosclerosis of the great vessels of the mediastinum and the coronary arteries, including calcified atherosclerotic plaque in the left main and left anterior descending coronary arteries. Ectasia of ascending thoracic aorta (4.0 cm in diameter). Severe thickening calcification of the aortic valve. Mediastinum/Lymph Nodes: No pathologically enlarged mediastinal or hilar lymph nodes. Esophagus is unremarkable in appearance. No axillary lymphadenopathy. Lungs/Pleura: Several small pulmonary nodules are noted in the right lung, largest of which is in the periphery of the right lower lobe (axial image 61 of series 17) measuring 10 x 8 mm (mean diameter of 9 mm). No acute consolidative airspace disease. No pleural effusions. Musculoskeletal/Soft Tissues: Chronic compression fracture of T8 with 90% loss of anterior vertebral body height. New compression fracture of T12 with 40% loss of anterior vertebral body height, which does not appear acute. There are no aggressive appearing lytic or blastic lesions noted in the visualized portions of the  skeleton. CTA ABDOMEN AND PELVIS FINDINGS Hepatobiliary: No suspicious cystic or solid hepatic lesions. No intra or extrahepatic biliary ductal dilatation. Gallbladder is normal in appearance. Pancreas: No pancreatic mass. No pancreatic ductal dilatation. No pancreatic or peripancreatic fluid or inflammatory changes. Spleen: Unremarkable. Adrenals/Urinary Tract: Mild diffuse cortical atrophy in the left kidney. Right kidney and adrenal glands are normal in appearance. No hydroureteronephrosis. Urinary bladder is normal in appearance. Stomach/Bowel: Normal appearance of the stomach. No pathologic dilatation of small bowel or colon. Numerous colonic diverticulae are noted, without surrounding inflammatory changes to suggest an acute diverticulitis at this time. The appendix is not confidently identified and may be surgically absent. Regardless, there are no inflammatory changes noted adjacent to the cecum to suggest the presence of an acute appendicitis at this time. Vascular/Lymphatic: Aortic atherosclerosis with fusiform aneurysmal dilatation of the infrarenal abdominal aorta which measures up to 3.7 x 4.0 cm. Vascular findings and measurements pertinent to potential TAVR procedure, as detailed below. No lymphadenopathy noted in the abdomen or pelvis. Reproductive: Prostate gland and seminal vesicles are unremarkable in appearance. Other: No significant volume of ascites.  No pneumoperitoneum. Musculoskeletal: Chronic compression fracture of L4 with 30% loss of anterior vertebral body height. There are no aggressive appearing lytic or blastic lesions noted in the visualized portions of the skeleton. VASCULAR MEASUREMENTS PERTINENT TO TAVR: AORTA: Minimal Aortic Diameter-20 x 14 mm Severity of Aortic  Calcification-severe RIGHT PELVIS: Right Common Iliac Artery - Minimal Diameter-10.3 x 8.3 mm Tortuosity-moderate to severe Calcification-moderate Right External Iliac Artery - Minimal Diameter-8.3 x 7.2 mm  Tortuosity-mild-to-moderate Calcification-mild Right Common Femoral Artery - Minimal Diameter-8.3 x 8.3 mm Tortuosity-mild Calcification-mild LEFT PELVIS: Left Common Iliac Artery - Minimal Diameter-8.4 x 8.6 mm Tortuosity-mild Calcification-moderate Left External Iliac Artery - Minimal Diameter-7.7 x 7.9 mm Tortuosity-mild Calcification-mild Left Common Femoral Artery - Minimal Diameter-8.2 x 8.0 mm Tortuosity-mild Calcification-moderate Review of the MIP images confirms the above findings. IMPRESSION: 1. Vascular findings and measurements pertinent to potential TAVR procedure, as detailed above. 2. Severe thickening calcification of the aortic valve, compatible with the reported clinical history of aortic stenosis. 3. Aortic atherosclerosis, in addition to left main and left anterior descending coronary artery disease. There is also ectasia of the ascending thoracic aorta (4.0 cm in diameter), as well as fusiform aneurysmal dilatation of the infrarenal abdominal aorta (3.7 x 4.0 cm). Recommend followup by ultrasound in 2 years. This recommendation follows ACR consensus guidelines: White Paper of the ACR Incidental Findings Committee II on Vascular Findings. J Am Coll Radiol 2013; 10:789-794. 4. Multiple small pulmonary nodules in the right lung, largest of which measures up to a mean diameter of 9 mm in the right lower lobe. Non-contrast chest CT at 3-6 months is recommended. If the nodules are stable at time of repeat CT, then future CT at 18-24 months (from today's scan) is considered optional for low-risk patients, but is recommended for high-risk patients. This recommendation follows the consensus statement: Guidelines for Management of Incidental Pulmonary Nodules Detected on CT Images: From the Fleischner Society 2017; Radiology 2017; 284:228-243. 5. Colonic diverticulosis without evidence of acute diverticulitis at this time. 6. Additional incidental findings, as above. Electronically Signed   By: Trudie Reed M.D.   On: 03/11/2019 12:27   Disposition   Pt is being discharged home today in good condition.  Follow-up Plans & Appointments    Follow-up Information    Janetta Hora, PA-C Follow up on 03/25/2019.   Specialties: Cardiology, Radiology Why: Please arrive 15 minutes early for your in-office appointment at 2:30pm 03/25/2019 with Carlean Jews, PA-C.  Contact information: [address] [place] [address] [phone]          Discharge Instructions    Diet - low sodium heart healthy   Complete by: As directed    Increase activity slowly   Complete by: As directed       Discharge Medications   Allergies as of 03/21/2019   No Known Allergies     Medication List    STOP taking these medications   naproxen sodium 220 MG tablet Commonly known as: ALEVE     TAKE these medications   acetaminophen 500 MG tablet Commonly known as: TYLENOL Take 1,000 mg by mouth every 6 (six) hours as needed for headache (pain).   amLODipine 5 MG tablet Commonly known as: NORVASC Take 1 tablet (5 mg total) by mouth daily.   aspirin 81 MG chewable tablet Chew 1 tablet (81 mg total) by mouth daily.   atorvastatin 20 MG tablet Commonly known as: LIPITOR Take 1 tablet (20 mg total) by mouth daily at 6 PM.   clopidogrel 75 MG tablet Commonly known as: PLAVIX Take 1 tablet (75 mg total) by mouth daily with breakfast.   furosemide 20 MG tablet Commonly known as: LASIX Take 1 tablet (20 mg total) by mouth as needed. What changed:  when to take this  reasons to take this   lisinopril 10 MG tablet Commonly known as: ZESTRIL Take 1 tablet (10 mg total) by mouth daily.   loratadine 10 MG tablet Commonly known as: CLARITIN Take 1 tablet (10 mg total) by mouth daily. What changed:   when to take this  reasons to take this   Memantine HCl-Donepezil HCl 28-10 MG Cp24 Commonly known as: Namzaric Take 1 capsule by mouth daily.   multivitamin  with minerals Tabs tablet Take 1 tablet by mouth daily.   sertraline 50 MG tablet Commonly known as: ZOLOFT Take 1 tablet (50 mg total) by mouth daily.        Acute coronary syndrome (MI, NSTEMI, STEMI, etc) this admission?: No.    Outstanding Labs/Studies    Duration of Discharge Encounter   Greater than 30 minutes including physician time.  Jolene ProvostSigned, Ledon Weihe, PA-C 03/21/2019, 10:32 AM

## 2019-03-21 NOTE — Progress Notes (Addendum)
Progress Note  Patient Name: Dan Perez Date of Encounter: 03/21/2019  Primary Cardiologist: Prentice DockerSuresh Koneswaran, MD   Subjective   Denies CP or dyspnea  Inpatient Medications    Scheduled Meds: . amLODipine  5 mg Oral Daily  . aspirin  81 mg Oral Daily  . atorvastatin  20 mg Oral q1800  . clopidogrel  75 mg Oral Q breakfast  . memantine  28 mg Oral QHS   And  . donepezil  10 mg Oral QHS  . feeding supplement (ENSURE ENLIVE)  237 mL Oral BID BM  . multivitamin with minerals  1 tablet Oral Daily  . polyethylene glycol  17 g Oral Daily  . sertraline  50 mg Oral Daily  . sodium chloride flush  3 mL Intravenous Q12H   Continuous Infusions: . sodium chloride    . nitroGLYCERIN    . phenylephrine (NEO-SYNEPHRINE) Adult infusion     PRN Meds: sodium chloride, acetaminophen **OR** acetaminophen, metoprolol tartrate, morphine injection, ondansetron (ZOFRAN) IV, oxyCODONE, sodium chloride flush, traMADol   Vital Signs    Vitals:   03/20/19 2024 03/20/19 2343 03/21/19 0532 03/21/19 0800  BP: 116/64 131/74 (!) 161/82 123/72  Pulse: 79 64 62 64  Resp: (!) 28 (!) 22 (!) 21 19  Temp: 98.2 F (36.8 C) 98.4 F (36.9 C) 98.7 F (37.1 C) 98 F (36.7 C)  TempSrc: Oral Oral Oral Oral  SpO2: 96% 93% 96% 96%  Weight:      Height:        Intake/Output Summary (Last 24 hours) at 03/21/2019 0947 Last data filed at 03/21/2019 0532 Gross per 24 hour  Intake -  Output 2050 ml  Net -2050 ml   Last 3 Weights 03/20/2019 03/19/2019 03/18/2019  Weight (lbs) 145 lb 15.1 oz 143 lb 11.8 oz 144 lb 10 oz  Weight (kg) 66.2 kg 65.2 kg 65.6 kg      Telemetry    Sinus rhythm - Personally Reviewed  Physical Exam   General: NAD HEENT: normal Neuro: no focal findings Neck: supple Lungs:CTA Cardiovascular: RRR, 1/6 systolic murmur Abdomen:Soft. NT, ND Extremities: No edema.    Labs     Chemistry Recent Labs  Lab 03/17/19 220 042 [id number]  03/17/19 1349 03/18/19 0419 03/20/19 1053  NA  132*   < > 134* 133* 130*  K 5.4*   < > 5.0 4.9 4.6  CL 96*  --  99 97* 96*  CO2 30  --   --  24 27  GLUCOSE 94   < > 102* 108* 128*  BUN 31*  --  26* 26* 29*  CREATININE 1.15  --  1.00 1.21 1.23  CALCIUM 8.8*  --   --  8.3* 8.3*  PROT 6.3*  --   --   --   --   ALBUMIN 3.0*  --   --   --   --   AST 21  --   --   --   --   ALT 17  --   --   --   --   ALKPHOS 58  --   --   --   --   BILITOT 0.4  --   --   --   --   GFRNONAA 58*  --   --  55* 54*  GFRAA >60  --   --  >60 >60  ANIONGAP 6  --   --  12 7   < > = values in this interval  not displayed.     Hematology Recent Labs  Lab 03/17/19 [id number]  03/17/19 1349 03/18/19 0419 03/20/19 0038  WBC 7.1  --   --  8.2 8.5  RBC 3.87*  --   --  3.65* 3.18*  HGB 11.1*   < > 10.5* 10.6* 9.2*  HCT 35.1*   < > 31.0* 32.7* 28.6*  MCV 90.7  --   --  89.6 89.9  MCH 28.7  --   --  29.0 28.9  MCHC 31.6  --   --  32.4 32.2  RDW 13.6  --   --  13.6 13.6  PLT 198  --   --  175 157   < > = values in this interval not displayed.    Cardiac Studies   Echo 02/16/19:   1. The left ventricle has normal systolic function with an ejection fraction of 60-65%. The cavity size was normal. There is mildly increased left ventricular wall thickness. Left ventricular diastolic Doppler parameters are consistent with impaired  relaxation. No evidence of left ventricular regional wall motion abnormalities.  2. The right ventricle has normal systolic function. The cavity was normal. There is no increase in right ventricular wall thickness.  3. Mild calcification of the mitral valve leaflet. There is mild mitral annular calcification present. No evidence of mitral valve stenosis. Mild mitral regurgitation.  4. Bioprosthetic aortic valve s/p TAVR. Mean gradient 9 mmHg with AVA 1.33 cm^2. No significant peri-valvular regurgitation noted.  5. The aortic root is normal in size and structure.  6. Normal IVC size. No complete TR doppler jet so unable to estimate PA  systolic pressure.   Patient Profile     85 y.o. male with severe AS now s/p TAVR. He was admitted with syncope 03/06/19 and was found to have severe AS . Mild CAD by cath.   Assessment & Plan    1. Severe aortic stenosis: Patient is doing well status post TAVR with no dyspnea or chest pain.  Patient can be discharged to SNF when bed available.  Continue aspirin and Plavix.   2. CAD: Continue ASA and statin.   3. Fever: Patient had low-grade temperature of 100.5 at 12:00 yesterday.  No fever since.  Will check CBC for white blood cell count.  He denies cough, nausea, diarrhea or dysuria.  If WBC normal patient can be discharged.  If white blood cell count normal discharge today.  Arrange follow-up structural heart clinic in 2 to 4 weeks. Greater than 30 minutes PA and physician time. D2  For questions or updates, please contact [place] Please consult [email or web address] for contact info under        Signed, Kirk Ruths, MD  03/21/2019, 9:47 AM

## 2019-03-21 NOTE — Progress Notes (Signed)
Spoke with Social working. Patient will need to be Fever free for 48 hours prior to discharge to SNF, also patient will need new COVID test. PA paged through [facility] system to make aware. Will monitor patient. Trenia Tennyson, Bettina Gavia RN

## 2019-03-21 NOTE — Progress Notes (Signed)
Patient daughter [name] updated on patient. Will monitor patient .Jahniyah Revere, Bettina Gavia RN

## 2019-03-21 NOTE — TOC Progression Note (Signed)
Transition of Care [facility]) - Progression Note    Patient Details  Name: Dan Perez MRN: [id number] Date of Birth: 11/09/1931  Transition [facility] Care [facility] CM/SW [place], [place] Phone Number: [phone] 03/21/2019, 11:06 AM  Clinical Narrative:    CSW received call from RN wanting to verify that patient could not be discharged to SNF today due to being fever. CSW informed RN that she would follow up with [facility] again.  CSW called Gerald Stabs at the [facility] and confirmed that patient would not be able to come until Monday and a new COVID test would need to be completed.  CSW informed RN about information received and needing a new COVID test.     Expected Discharge Plan: [place] Barriers to Discharge: Barriers Resolved  Expected Discharge Plan and Services Expected Discharge Plan: [place] Choice: [place] arrangements for the past 2 months: Single Family Home Expected Discharge Date: 03/21/19                                     Social Determinants of Health (SDOH) Interventions    Readmission Risk Interventions No flowsheet data found.

## 2019-03-21 NOTE — Progress Notes (Signed)
Patient up to chair to eat lunch. Will monitor patient .Motty Borin, Bettina Gavia rN

## 2019-03-21 NOTE — Plan of Care (Signed)
  Problem: Education: Goal: Knowledge of General Education information will improve Description: Including pain rating scale, medication(s)/side effects and non-pharmacologic comfort measures Outcome: Not Progressing   Problem: Health Behavior/Discharge Planning: Goal: Ability to manage health-related needs will improve Outcome: Not Progressing   

## 2019-03-21 NOTE — Progress Notes (Signed)
Called RN who was informed that the [facility] would not take the patient if he has had a fever within 24 hours of discharge.  They will also require another COVID test since it has to been done within 7 days (I have ordered).  It's [possible but not certain they will be able to accept him Monday.  Kerin Ransom PA-C 03/21/2019 11:30 AM

## 2019-03-22 LAB — NOVEL CORONAVIRUS, NAA (HOSP ORDER, SEND-OUT TO REF LAB; TAT 18-24 HRS): SARS-CoV-2, NAA: NOT DETECTED

## 2019-03-22 MED ORDER — HEPARIN SODIUM (PORCINE) 5000 UNIT/ML IJ SOLN
5000.0000 [IU] | Freq: Three times a day (TID) | INTRAMUSCULAR | Status: DC
  Administered 2019-03-22 – 2019-03-23 (×4): 5000 [IU] via SUBCUTANEOUS
  Filled 2019-03-22 (×4): qty 1

## 2019-03-22 NOTE — Progress Notes (Signed)
Progress Note  Patient Name: Dan BrimLawrence E Kallen Date of Encounter: 03/22/2019  Primary Cardiologist: Prentice DockerSuresh Koneswaran, MD   Subjective   No CP or dyspnea  Inpatient Medications    Scheduled Meds: . amLODipine  5 mg Oral Daily  . aspirin  81 mg Oral Daily  . atorvastatin  20 mg Oral q1800  . clopidogrel  75 mg Oral Q breakfast  . memantine  28 mg Oral QHS   And  . donepezil  10 mg Oral QHS  . feeding supplement (ENSURE ENLIVE)  237 mL Oral BID BM  . multivitamin with minerals  1 tablet Oral Daily  . polyethylene glycol  17 g Oral Daily  . sertraline  50 mg Oral Daily  . sodium chloride flush  3 mL Intravenous Q12H   Continuous Infusions: . sodium chloride    . nitroGLYCERIN    . phenylephrine (NEO-SYNEPHRINE) Adult infusion     PRN Meds: sodium chloride, acetaminophen **OR** acetaminophen, metoprolol tartrate, morphine injection, ondansetron (ZOFRAN) IV, oxyCODONE, sodium chloride flush, traMADol   Vital Signs    Vitals:   03/21/19 1317 03/21/19 1629 03/21/19 1927 03/22/19 0428  BP: (!) 142/99 (!) 152/83 140/77 (!) 146/77  Pulse: 71 69 69 62  Resp: 20 (!) 21 14 20   Temp:  98.6 F (37 C) 98.6 F (37 C) 98.7 F (37.1 C)  TempSrc:  Oral Oral Oral  SpO2: 99% 97% 96% 97%  Weight:    64 kg  Height:        Intake/Output Summary (Last 24 hours) at 03/22/2019 0830 Last data filed at 03/22/2019 0429 Gross per 24 hour  Intake 340 ml  Output 1325 ml  Net -985 ml   Last 3 Weights 03/22/2019 03/20/2019 03/19/2019  Weight (lbs) 141 lb 3.2 oz 145 lb 15.1 oz 143 lb 11.8 oz  Weight (kg) 64.048 kg 66.2 kg 65.2 kg      Telemetry    Sinus rhythm - Personally Reviewed  Physical Exam   General: NAD Frail HEENT: normal Neuro: grossly intact Neck: No JVD Lungs:CTA, no wheeze Cardiovascular: RRR, no DM Abdomen:Soft. NT, ND, no masses Extremities: No edema.    Labs     Chemistry Recent Labs  Lab 03/17/19 870 [id number]  03/17/19 1349 03/18/19 0419 03/20/19 1053  NA 132*    < > 134* 133* 130*  K 5.4*   < > 5.0 4.9 4.6  CL 96*  --  99 97* 96*  CO2 30  --   --  24 27  GLUCOSE 94   < > 102* 108* 128*  BUN 31*  --  26* 26* 29*  CREATININE 1.15  --  1.00 1.21 1.23  CALCIUM 8.8*  --   --  8.3* 8.3*  PROT 6.3*  --   --   --   --   ALBUMIN 3.0*  --   --   --   --   AST 21  --   --   --   --   ALT 17  --   --   --   --   ALKPHOS 58  --   --   --   --   BILITOT 0.4  --   --   --   --   GFRNONAA 58*  --   --  55* 54*  GFRAA >60  --   --  >60 >60  ANIONGAP 6  --   --  12 7   < > = values  in this interval not displayed.     Hematology Recent Labs  Lab 03/18/19 0419 03/20/19 0038 03/21/19 1014  WBC 8.2 8.5 7.9  RBC 3.65* 3.18* 3.47*  HGB 10.6* 9.2* 10.2*  HCT 32.7* 28.6* 30.6*  MCV 89.6 89.9 88.2  MCH 29.0 28.9 29.4  MCHC 32.4 32.2 33.3  RDW 13.6 13.6 13.6  PLT 175 157 166    Cardiac Studies   Echo 02/16/19:   1. The left ventricle has normal systolic function with an ejection fraction of 60-65%. The cavity size was normal. There is mildly increased left ventricular wall thickness. Left ventricular diastolic Doppler parameters are consistent with impaired  relaxation. No evidence of left ventricular regional wall motion abnormalities.  2. The right ventricle has normal systolic function. The cavity was normal. There is no increase in right ventricular wall thickness.  3. Mild calcification of the mitral valve leaflet. There is mild mitral annular calcification present. No evidence of mitral valve stenosis. Mild mitral regurgitation.  4. Bioprosthetic aortic valve s/p TAVR. Mean gradient 9 mmHg with AVA 1.33 cm^2. No significant peri-valvular regurgitation noted.  5. The aortic root is normal in size and structure.  6. Normal IVC size. No complete TR doppler jet so unable to estimate PA systolic pressure.   Patient Profile     85 y.o. male with severe AS now s/p TAVR. He was admitted with syncope 03/06/19 and was found to have severe AS . Mild CAD by  cath.   Assessment & Plan    1. Severe aortic stenosis: Continue aspirin and Plavix.  No dyspnea.  Can be discharged to SNF when facility willing to accept.  2. CAD: Continue ASA and statin.   3. Fever: No recurrences.  White blood cell count normal.  No localizing signs/symptoms of infection.   Possible discharge tomorrow if remains afebrile (had isolated temperature of 100.5 at 12 PM on July 3).  [facility] will not accept patients within 24 hours of fever.  For questions or updates, please contact [place] Please consult [email or web address] for contact info under   Signed, Kirk Ruths, MD  03/22/2019, 8:30 AM

## 2019-03-22 NOTE — Progress Notes (Signed)
Patient wanted to speak to daughter [name], this RN left daughter a message to call patient with direct phone number. Dan Perez, Bettina Gavia RN

## 2019-03-23 ENCOUNTER — Institutional Professional Consult (permissible substitution): Payer: PPO | Admitting: Cardiovascular Disease

## 2019-03-23 DIAGNOSIS — E440 Moderate protein-calorie malnutrition: Secondary | ICD-10-CM | POA: Diagnosis not present

## 2019-03-23 DIAGNOSIS — M6281 Muscle weakness (generalized): Secondary | ICD-10-CM | POA: Diagnosis not present

## 2019-03-23 DIAGNOSIS — E785 Hyperlipidemia, unspecified: Secondary | ICD-10-CM | POA: Diagnosis not present

## 2019-03-23 DIAGNOSIS — R262 Difficulty in walking, not elsewhere classified: Secondary | ICD-10-CM | POA: Diagnosis not present

## 2019-03-23 DIAGNOSIS — N183 Chronic kidney disease, stage 3 (moderate): Secondary | ICD-10-CM | POA: Diagnosis not present

## 2019-03-23 DIAGNOSIS — I10 Essential (primary) hypertension: Secondary | ICD-10-CM | POA: Diagnosis not present

## 2019-03-23 DIAGNOSIS — F329 Major depressive disorder, single episode, unspecified: Secondary | ICD-10-CM | POA: Diagnosis not present

## 2019-03-23 DIAGNOSIS — R1312 Dysphagia, oropharyngeal phase: Secondary | ICD-10-CM | POA: Diagnosis not present

## 2019-03-23 DIAGNOSIS — R609 Edema, unspecified: Secondary | ICD-10-CM | POA: Diagnosis not present

## 2019-03-23 DIAGNOSIS — R55 Syncope and collapse: Secondary | ICD-10-CM | POA: Diagnosis not present

## 2019-03-23 DIAGNOSIS — Z952 Presence of prosthetic heart valve: Secondary | ICD-10-CM | POA: Diagnosis not present

## 2019-03-23 DIAGNOSIS — F0281 Dementia in other diseases classified elsewhere with behavioral disturbance: Secondary | ICD-10-CM | POA: Diagnosis not present

## 2019-03-23 DIAGNOSIS — I350 Nonrheumatic aortic (valve) stenosis: Secondary | ICD-10-CM | POA: Diagnosis not present

## 2019-03-23 DIAGNOSIS — I714 Abdominal aortic aneurysm, without rupture: Secondary | ICD-10-CM | POA: Diagnosis not present

## 2019-03-23 DIAGNOSIS — F0390 Unspecified dementia without behavioral disturbance: Secondary | ICD-10-CM | POA: Diagnosis not present

## 2019-03-23 DIAGNOSIS — R410 Disorientation, unspecified: Secondary | ICD-10-CM | POA: Diagnosis not present

## 2019-03-23 DIAGNOSIS — R41841 Cognitive communication deficit: Secondary | ICD-10-CM | POA: Diagnosis not present

## 2019-03-23 DIAGNOSIS — E43 Unspecified severe protein-calorie malnutrition: Secondary | ICD-10-CM | POA: Diagnosis not present

## 2019-03-23 DIAGNOSIS — I872 Venous insufficiency (chronic) (peripheral): Secondary | ICD-10-CM | POA: Diagnosis not present

## 2019-03-23 DIAGNOSIS — G301 Alzheimer's disease with late onset: Secondary | ICD-10-CM | POA: Diagnosis not present

## 2019-03-23 DIAGNOSIS — M2550 Pain in unspecified joint: Secondary | ICD-10-CM | POA: Diagnosis not present

## 2019-03-23 DIAGNOSIS — R52 Pain, unspecified: Secondary | ICD-10-CM | POA: Diagnosis not present

## 2019-03-23 DIAGNOSIS — J302 Other seasonal allergic rhinitis: Secondary | ICD-10-CM | POA: Diagnosis not present

## 2019-03-23 DIAGNOSIS — Z7401 Bed confinement status: Secondary | ICD-10-CM | POA: Diagnosis not present

## 2019-03-23 NOTE — TOC Transition Note (Signed)
Transition of Care [facility]) - CM/SW Discharge Note   Patient Details  Name: Dan Perez MRN: [id number] Date of Birth: [birth year]  Transition of Care [facility]) CM/SW Contact:  Gelene Mink, [place] Phone Number: 03/23/2019, 2:13 PM   Clinical Narrative:     Patient will DC to: [facility] Anticipated DC date: 03/23/2019 Family notified: Yes Transport by: Corey Harold   Per MD patient ready for DC to . RN, patient, patient's family, and facility notified of DC. Discharge Summary and FL2 sent to facility. RN to call report prior to discharge [phone]). The patient will report to room 520 bed 1.  DC packet on chart. Ambulance transport requested for patient.   CSW will sign off for now as social work intervention is no longer needed. Please consult [place] again if new needs arise.  Minetta Krisher, LCSW-A [place]/Clinical Social Work Department Cell: [phone]    Final next level of care: [place] Barriers to Discharge: No Barriers Identified   Patient Goals and CMS Choice Patient states their goals for this hospitalization and ongoing recovery are:: Pt will go to [facility] to complete rehab CMS Medicare.gov Compare Post Acute Care list provided to:: Patient Choice offered to / list presented to : Patient  Discharge Placement PASRR number recieved: 03/18/19 Existing PASRR number confirmed : 03/18/19          Patient chooses bed at: [facility] Patient to be transferred to facility by: [place] Name of family member notified: [name] Patient and family notified of of transfer: 03/23/19  Discharge Plan and Services     Post Acute Care Choice: [place]          DME Arranged: N/A DME Agency: NA       HH Arranged: NA HH Agency: NA        Social Determinants of Health (SDOH) Interventions     Readmission Risk Interventions No flowsheet data found.

## 2019-03-23 NOTE — Progress Notes (Signed)
Nutrition Follow-up  DOCUMENTATION CODES:   Severe malnutrition in context of chronic illness  INTERVENTION:   -Continue MVI with minerals daily -Continue Ensure Enlive po BID, each supplement provides 350 kcal and 20 grams of protein -Continue Magic cup TID with meals, each supplement provides 290 kcal and 9 grams of protein  NUTRITION DIAGNOSIS:   Severe Malnutrition related to chronic illness(severe aortic stenosis, CKD) as evidenced by severe fat depletion, severe muscle depletion, energy intake < 75% for > or equal to 1 month.  Ongoing  GOAL:   Patient will meet greater than or equal to 90% of their needs  Progressing   MONITOR:   PO intake, Supplement acceptance, Labs, Weight trends, Skin, I & O's  REASON FOR ASSESSMENT:   Malnutrition Screening Tool    ASSESSMENT:   85 y/o male with a h/o HTN, progressive/Severe aortic stenosis, stage III chronic kidney disease, moderate mitral regurgitation, dilated abdominal aorta, and coronary calcium noted on prior CT.  Aortic stenosis has been followed over the years with serial echocardiography.  Most recent echo in May of this year, showed progression to severe aortic stenosis with a relatively low mean gradient of 24 mmHg but with an aortic valve area of 0.75 cm.  He was recently evaluated by Dr. Bronson Ing on 6/9, b/c his dtr reported that he was having increasing dyspnea along with sudden spells of weakness during which, he would become very pale.  These spells were short lived.  Patient says that he has no recollection of these spells occurring, simply that his daughter tells him that they happen.  In the setting of concern for possible syncope and known severe AS, he was referred for TAVR eval and currently has an appt w/ Dr. Angelena Form on 7/6.  Of note, he has been markedly hypertensive at home and on office visits, and this has resulted in titration of his ACE inhibitor therapy.  He also takes Lasix.  6/23- s/p rt/lt heart  cath with coronary angiography 6/30- s/p TAVR  Reviewed I/O's: -1.2 L x 24 hours and -10.2 L since 03/09/19  UOP: 1.7 L x 24 hours  Pt remains with good appetite; noted meal completion 50-100%. Pt is compliant with Ensure supplements, which he still enjoys.   Per CSW notes, plan to d/c to [facility] SNF today.   Labs reviewed: Na: 130.   Diet Order:   Diet Order            Diet - low sodium heart healthy        Diet Heart Room service appropriate? Yes with Assist; Fluid consistency: Thin  Diet effective now              EDUCATION NEEDS:   Education needs have been addressed  Skin:  Skin Assessment: Skin Integrity Issues: Skin Integrity Issues:: Incisions Incisions: rt and lt groin Other: MASD bilateral buttocks  Last BM:  03/22/19  Height:   Ht Readings from Last 1 Encounters:  03/06/19 6' (1.829 m)    Weight:   Wt Readings from Last 1 Encounters:  03/23/19 64.3 kg    Ideal Body Weight:  80.9 kg  BMI:  Body mass index is 19.22 kg/m.  Estimated Nutritional Needs:   Kcal:  1700-1900  Protein:  80-95 grams  Fluid:  > 1.7 L    Shaniya Tashiro A. Jimmye Norman, RD, LDN, [place] Registered Dietitian II Certified Diabetes Care and Education Specialist Pager: [phone] After hours Pager: [phone]

## 2019-03-23 NOTE — Discharge Instructions (Signed)
ACTIVITY AND EXERCISE °• Daily activity and exercise are an important part of your recovery. People recover at different rates depending on their general health and type of valve procedure. °• Most people recovering from TAVR feel better relatively quickly  °• No lifting, pushing, pulling more than 10 pounds (examples to avoid: groceries, vacuuming, gardening, golfing): °            - For one week with a procedure through the groin. °            - For six weeks for procedures through the chest wall or neck °NOTE: You will typically see one of our providers 7-14 days after your procedure to discuss WHEN TO RESUME the above activities.  °  °  °DRIVING °• Do not drive for until you are seen for follow up and cleared by a provider. Generally, we ask patient to not drive for 1 week after their procedure. °• If you have been told by your doctor in the past that you may not drive, you must talk with him/her before you begin driving again. °  °  °DRESSING °• Groin site: you may leave the clear dressing over the site for up to one week or until it falls off. °  °  °HYGIENE °• If you had a femoral (leg) procedure, you may take a shower when you return home. After the shower, pat the site dry. Do NOT use powder, oils or lotions in your groin area until the site has completely healed. °• If you had a chest procedure, you may shower when you return home unless specifically instructed not to by your discharging practitioner. °            - DO NOT scrub incision; pat dry with a towel °            - DO NOT apply any lotions, oils, powders to the incision °            - No tub baths / swimming for at least 2 weeks. °• If you notice any fevers, chills, increased pain, swelling, bleeding or pus, please contact your doctor. °  °ADDITIONAL INFORMATION °• If you are going to have an upcoming dental procedure, please contact our office as you will require antibiotics ahead of time to prevent infection on your heart valve.  ° ° °If you  have any questions or concerns you can call the structural heart phone during normal business hours 8am-4pm. If you have an urgent need after hours or weekends please call [phone] to talk to the on call provider for general cardiology. If you have an emergency that requires immediate attention, please call 911.  ° ° °After TAVR Checklist ° °Check  Test Description  ° Follow up appointment in 1-2 weeks  You will see our structural heart physician assistant, Katie Tahjae Durr. Your incision sites will be checked and you will be cleared to drive and resume all normal activities if you are doing well.    ° 1 month echo and follow up  You will have an echo to check on your new heart valve and be seen back in the office by Katie Raijon Lindfors. Many times the echo is not read by your appointment time, but Katie will call you later that day or the following day to report your results.  ° Follow up with your primary cardiologist You will need to be seen by your primary cardiologist in the following 3-6 months after your 1   month appointment in the valve clinic. Often times your Plavix or Aspirin will be discontinued during this time, but this is decided on a case by case basis.   ° 1 year echo and follow up You will have another echo to check on your heart valve after 1 year and be seen back in the office by Katie Kadir Azucena. This your last structural heart visit.  ° Bacterial endocarditis prophylaxis  You will have to take antibiotics for the rest of your life before all dental procedures (even teeth cleanings) to protect your heart valve. Antibiotics are also required before some surgeries. Please check with your cardiologist before scheduling any surgeries. Also, please make sure to tell us if you have a penicillin allergy as you will require an alternative antibiotic.   ° ° ° ° °[facility]   °MULTIDISCIPLINARY HEART VALVE TEAM ° ° °YOUR CARDIOLOGY TEAM HAS ARRANGED FOR AN E-VISIT FOR YOUR APPOINTMENT - PLEASE  REVIEW IMPORTANT INFORMATION BELOW SEVERAL DAYS PRIOR TO YOUR APPOINTMENT ° °Due to the recent COVID-19 pandemic, we are transitioning in-person office visits to tele-medicine visits in an effort to decrease unnecessary exposure to our patients, their families, and staff. These visits are billed to your insurance just like a normal visit is. We also encourage you to sign up for MyChart if you have not already done so. You will need a smartphone if possible. For patients that do not have this, we can still complete the visit using a regular telephone but do prefer a smartphone to enable video when possible. You may have a family member that lives with you that can help. If possible, we also ask that you have a blood pressure cuff and scale at home to measure your blood pressure, heart rate and weight prior to your scheduled appointment. Patients with clinical needs that need an in-person evaluation and testing will still be able to come to the office if absolutely necessary. If you have any questions, feel free to call our office. ° ° ° °YOUR PROVIDER WILL BE USING THE FOLLOWING PLATFORM TO COMPLETE YOUR VISIT: [email or web address] °All you need is a smart phone. You will receive a text message from the provider through the [email or web address] app. You will follow the prompts and it will take you to a virtual visit with your provider. Please check your blood pressure, heart rate and weight prior to your scheduled appointment. ° °CONSENT FOR TELE-HEALTH VISIT - PLEASE REVIEW ° °I hereby voluntarily request, consent and authorize [facility] and its employed or contracted physicians, physician assistants, nurse practitioners or other licensed health care professionals (the Practitioner), to provide me with telemedicine health care services (the “Services") as deemed necessary by the treating Practitioner. I acknowledge and consent to receive the Services by the Practitioner via telemedicine. I understand that the telemedicine visit will  involve communicating with the Practitioner through live audiovisual communication technology and the disclosure of certain medical information by electronic transmission. I acknowledge that I have been given the opportunity to request an in-person assessment or other available alternative prior to the telemedicine visit and am voluntarily participating in the telemedicine visit. ° °I understand that I have the right to withhold or withdraw my consent to the use of telemedicine in the course of my care at any time, without affecting my right to future care or treatment, and that the Practitioner or I may terminate the telemedicine visit at any time. I understand that I have the right to inspect all information obtained   and/or recorded in the course of the telemedicine visit and may receive copies of available information for a reasonable fee.  I understand that some of the potential risks of receiving the Services via telemedicine include:  °• Delay or interruption in medical evaluation due to technological equipment failure or disruption; °• Information transmitted may not be sufficient (e.g. poor resolution of images) to allow for appropriate medical decision making by the Practitioner; and/or  °• In rare instances, security protocols could fail, causing a breach of personal health information. ° °Furthermore, I acknowledge that it is my responsibility to provide information about my medical history, conditions and care that is complete and accurate to the best of my ability. I acknowledge that Practitioner's advice, recommendations, and/or decision may be based on factors not within their control, such as incomplete or inaccurate data provided by me or distortions of diagnostic images or specimens that may result from electronic transmissions. I understand that the practice of medicine is not an exact science and that Practitioner makes no warranties or guarantees regarding treatment outcomes. I acknowledge that  I will receive a copy of this consent concurrently upon execution via email to the email address I last provided but may also request a printed copy by calling the office of [facility].   ° °I understand that my insurance will be billed for this visit.  ° °I have read or had this consent read to me. °• I understand the contents of this consent, which adequately explains the benefits and risks of the Services being provided via telemedicine.  °• I have been provided ample opportunity to ask questions regarding this consent and the Services and have had my questions answered to my satisfaction. °• I give my informed consent for the services to be provided through the use of telemedicine in my medical care ° °By participating in this telemedicine visit I agree to the above. °

## 2019-03-23 NOTE — Progress Notes (Signed)
CARDIAC REHAB PHASE I   PRE:  Rate/Rhythm: 73 SR  BP:  Supine: 127/66  Sitting:   Standing:    SaO2: 94%RA  MODE:  Ambulation: 50 ft   POST:  Rate/Rhythm: 91 SR  BP:  Supine:   Sitting: 146/90  Standing:    SaO2: 94%RA 1015-1042 Pt walked 50 ft on RA with gait belt use, rolling walker and asst x 1. Tired easily. Pt seems confused by his comments. To recliner with chair alarm.    Graylon Good, RN BSN  03/23/2019 10:38 AM

## 2019-03-23 NOTE — Progress Notes (Signed)
Attempted report to [facility]. No answer. Will attempt again. Elliett Guarisco, Bettina Gavia RN

## 2019-03-23 NOTE — Progress Notes (Addendum)
Patient report called to [facility] in [place].AVS in discharge packet. IV and tele dcd. EMS to transport to [facility], Bettina Gavia RN

## 2019-03-23 NOTE — Discharge Summary (Addendum)
[facility]   MULTIDISCIPLINARY HEART VALVE TEAM  Discharge Summary    Patient ID: Dan Perez MRN: [id number]; DOB: March 10, 1932  Admit date: 03/06/2019 Discharge date: 03/23/2019  Primary Care Provider: Dettinger, Elige RadonJoshua A, MD  Primary Cardiologist: Prentice DockerSuresh Koneswaran, MD / Dr. Clifton JamesMcAlhany & Dr. 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  Prox Cx to Mid Cx lesion is 20% stenosed.  Mid LAD lesion is 20% stenosed.   1. Mild non-obstructive CAD 2. Unable to cross into the LV with a diagnostic catheter. The AL-1 was too short given his height and arm length). The long pigtail would not cross the valve given the severe angulation of the horizontal aortic root.   Recommendations: Will continue inpatient workup for TAVR. Will plans CT scans tomorrow if renal function is stable. Dr. Cornelius Moraswen will also see over the next day.     _____________    TAVR OPERATIVE NOTE   Date of Procedure:                03/17/2019  Preoperative Diagnosis:      Severe Aortic Stenosis   Postoperative Diagnosis:    Same   Procedure:        Transcatheter Aortic Valve Replacement - Percutaneous Left Transfemoral Approach             Edwards Sapien 3 THV (size 29 mm, model # 9600TFX, serial # [id number])              Co-Surgeons:                        Verne Carrowhristopher McAlhany, MD and Salvatore Decentlarence H. Cornelius Moraswen, MD  Anesthesiologist:                  Arta BruceKevin Ossey,  MD  Echocardiographer:              Thurmon FairMihai Croitoru, MD  Pre-operative Echo Findings: ? Severe aortic stenosis ? Normal left ventricular systolic function  Post-operative Echo Findings: ? No paravalvular leak ? Normal left ventricular systolic function  _____________   Echo 03/18/19 IMPRESSIONS  1. The left ventricle has normal systolic function with an ejection fraction of 60-65%. The cavity size was normal. There is mildly increased left ventricular wall thickness. Left ventricular diastolic Doppler parameters are consistent with impaired  relaxation. No evidence of left ventricular regional wall motion abnormalities.  2. The right ventricle has normal systolic function. The cavity was normal. There is no increase in right ventricular wall thickness.  3. Mild calcification of the mitral valve leaflet. There is mild mitral annular calcification present. No evidence of mitral valve stenosis. Mild mitral regurgitation.  4. Bioprosthetic aortic valve s/p TAVR. Mean gradient 9 mmHg with AVA 1.33 cm^2. No significant peri-valvular regurgitation noted.  5. The aortic root is normal in size and structure.  6. Normal IVC size. No complete TR doppler jet so unable to estimate PA systolic pressure.  History of Present Illness     Dan Perez is a 85 y.o. male with a hx of pectus excavatum, scoliosis, HTN, AAA, CKD stage III, anemia, delusion disorder, dementia, thyroidectomy w/ iatrogenic hypothyroidism, moderate MR and severe aortic stenosis who was admitted to [facility] on 03/06/19 with syncope.   Patient states that he was first told he had a heart murmur in the 1950s when he underwent physical examination at the time he joined the [employer].  In 2015 he underwent transthoracic echocardiogram and was referred for preoperative cardiac clearance prior to thyroidectomy because presence of a heart murmur.  Echo at that time revealed mild aortic stenosis with normal left ventricular  systolic function.  In 2018 he was referred to Dr. Bronson Ing for evaluation by his primary care physician to establish long-term follow-up for aortic stenosis.  Echocardiogram performed at that time revealed normal left ventricular systolic function with moderate aortic stenosis and moderate aortic insufficiency.  Continued follow-up was recommended.  In 2019 the patient was elevated evaluated in the emergency department for atypical chest pain in the setting of severe hypertension.  Troponins were normal.  He was mildly anemic.  He was prescribed tramadol for presumed musculoskeletal pain.  Routine follow-up echocardiogram performed May 2020 revealed further progression of disease with severe aortic stenosis. Peak velocity across the aortic valve was reported 3.2 m/s corresponding to mean transvalvular gradient 24 mmHg, but the DVI was notably quite low at 0.23, stroke-volume index 31.93, and aortic valve area calculated 0.75 cm, consistent with paradoxical low gradient with preserved ejection fraction severe aortic stenosis.  The patient was evaluated by Dr. Bronson Ing on February 24, 2019 and reported worsening exertional shortness of breath as well as occasional spells of "weakness and paleness" per the patient's daughters recollection.  Given progressive symptoms and severe aortic stenosis, he was referred to Dr. Angelena Form for TAVR evaluation on 03/23/2019. However, on March 06, 2019 the patient suffered a syncopal event and fell down striking his head, resulting in a laceration and contusion of the left side of his face.  He was taken to [facility] rocking him for evaluation where he was found to be in sinus rhythm.  Troponin was mildly elevated 0.02. Head CT revealed no intracranial abnormality.  His facial laceration was sutured closed. He was notably hypertensive at the time.  He was transferred to [facility] for further work up and treatment.    Hospital Course     Consultants: CT surgery    Syncope: felt to be related to his severe AS. Plans were made for inpatient TAVR.  Severe AS: pre TAVR cath showed mild non obst CAD. He underwent TAVR work up in house and underwent successful TAVR with a 29 mm Edwards Sapien THV on 03/17/2019. Post op echo showed EF 60% with normally functioning TAVR with mean gradient of 9 mm Hg and no significant PVL. ECG and tele have not shown any HAVB. Groin sites are healing well. Continue Aspirin and plavix. Plan to DC to short term rehab at [facility] SNF (this was delayed due to a transient fever and need for repeat covid testing). He will have repeat echo in 2 weeks and follow up virtual visit with me.   CKD stage III: creat has remained stable ~1.23 on 7/4. No new labs. Home PRN lasix and Lisinopril 10 mg resumed at discharge. Would check a BMET in 1 week if available at the SNF. Otherwise, we can get one when he comes  in for echo.   HTN: his home lisinopril and prn lasix were held on admission due to slight bump in Cr (up to 1.43). He was started on amlodipine 5mg  daily, but remained with SBP persistently greater than 130. Plan to resume on home medications of lisinopril 10mg  daily and PRN lasix with addition of amlodipine 5mg  daily.  AAA: CTA A/P this admission showed stable infrarenal AAA measuring 3.7 x 4.0 cm. Recommended for f/u US in 2 years   Fever: he had an isolated temp of 100.5 F at 12 PM on 7/3. Repeat Covid was negative. No recurrences.  White blood cell count normal.  No localizing signs/symptoms of infection.   Pulmonary nodules: incidental finding on CTA Chest. Recommended for f/u non-contrast CT Chest in 3-6 months for close observation.   Dispo: PT/OT evaluated patient and felt he was unsafe to go home, recommending SNF. Plan for DC to SNF today with follow up with echo and virtual visit with structural clinic in 2 weeks. If he is still at the [facility] at that time and want to avoid having to re-quarantine, these appointments  can be pushed out. Please call [phone] to reschedule.  _____________  Discharge Vitals Blood pressure 139/72, pulse 62, temperature 98.6 F (37 C), temperature source Oral, resp. rate 13, height 6' (1.829 m), weight 64.3 kg, SpO2 98 %.  Filed Weights   03/20/19 0347 03/22/19 0428 03/23/19 0434  Weight: 66.2 kg 64 kg 64.3 kg    PHYSICAL EXAM:    GEN: elderly and frail  HEENT: normal Neck: no JVD or masses Cardiac: RRR; no murmurs, rubs, or gallops,no edema  Respiratory:  clear to auscultation bilaterally, normal work of breathing GI: soft, nontender, nondistended, + BS MS: no deformity or atrophy Skin: legs with chronic venous stasis changes.  Groin sites clear without hematoma or ecchymosis  Neuro:  Alert and Oriented x 3, Strength and sensation are intact. A little disoriented this morning  Psych: euthymic mood, full affect   Labs & Radiologic Studies    CBC Recent Labs    03/21/19 1014  WBC 7.9  HGB 10.2*  HCT 30.6*  MCV 88.2  PLT 166   Basic Metabolic Panel No results for input(s): NA, K, CL, CO2, GLUCOSE, BUN, CREATININE, CALCIUM, MG, PHOS in the last 72 hours. Liver Function Tests No results for input(s): AST, ALT, ALKPHOS, BILITOT, PROT, ALBUMIN in the last 72 hours. No results for input(s): LIPASE, AMYLASE in the last 72 hours. Cardiac Enzymes No results for input(s): CKTOTAL, CKMB, CKMBINDEX, TROPONINI in the last 72 hours. BNP Invalid input(s): POCBNP D-Dimer No results for input(s): DDIMER in the last 72 hours. Hemoglobin A1C No results for input(s): HGBA1C in the last 72 hours. Fasting Lipid Panel No results for input(s): CHOL, HDL, LDLCALC, TRIG, CHOLHDL, LDLDIRECT in the last 72 hours. Thyroid Function Tests No results for input(s): TSH, T4TOTAL, T3FREE, THYROIDAB in the last 72 hours.  Invalid input(s): FREET3 _____________  Dg Chest 2 View  Result Date: 03/16/2019 CLINICAL DATA:  Preop for aortic stenosis. Ex-smoker. EXAM: CHEST - 2 VIEW  COMPARISON:  CT 03/11/2019. Most recent chest radiograph 07/15/2018 FINDINGS: Hyperinflation. Osteopenia with lower thoracic vertebral body height loss, suboptimally evaluated. Artifact degradation posteriorly on the lateral view. Midline trachea. Moderate cardiomegaly. Tortuous thoracic aorta. Atherosclerosis in the transverse aorta. No pleural effusion or pneumothorax. No congestive failure. Numerous leads and wires project over the chest. IMPRESSION: Cardiomegaly and hyperinflation, without acute disease. Aortic Atherosclerosis (ICD10-I70.0). Electronically Signed  By: Jeronimo Greaves M.D.   On: 03/16/2019 20:54   Ct Coronary Morph W/cta Cor W/score W/ca W/cm &/or Wo/cm  Addendum Date: 03/11/2019   ADDENDUM REPORT: 03/11/2019 12:48 CLINICAL DATA:  85 year old male with severe aortic stenosis being evaluated for a TAVR procedure. EXAM: Cardiac TAVR CT TECHNIQUE: The patient was scanned on a [employer]. A 120 kV retrospective scan was triggered in the descending thoracic aorta at 111 HU's. Gantry rotation speed was 250 msecs and collimation was .6 mm. No beta blockade or nitro were given. The 3D data set was reconstructed in 5% intervals of the R-R cycle. Systolic and diastolic phases were analyzed on a dedicated work station using MPR, MIP and VRT modes. The patient received 80 cc of contrast. FINDINGS: Aortic Valve: Trileaflet aortic valve with severely thickened and calcified leaflets and severely restricted leaflets opening and mild asymmetric calcifications extending into the LVOT under the left coronary sinus. Aorta: Normal size with moderate diffuse atherosclerotic plaque and calcifications. No dissection. Tortuous course of the abdominal aorta. Sinotubular Junction: 33 x 33 mm Ascending Thoracic Aorta: 39 x 38 mm Aortic Arch: 33 x 29 mm Descending Thoracic Aorta: 29 x 27 mm Sinus of Valsalva Measurements: Non-coronary: 37 mm Right -coronary: 36 mm Left -coronary: 36 mm Coronary Artery Height  above Annulus: Left Main: 19 mm Right Coronary: 17 mm Virtual Basal Annulus Measurements: Maximum/Minimum Diameter: 30.6 x 25.2 mm Mean Diameter: 26.7 mm Perimeter: 86 mm Area: 558 mm2 Optimum Fluoroscopic Angle for Delivery: RAO 4 CAU 43 IMPRESSION: 1. Trileaflet aortic valve with severely thickened and calcified leaflets and severely restricted leaflets opening and mild asymmetric calcifications extending into the LVOT under the left coronary sinus. Annular measurements suitable for delivery of a 29 mm Edwards-SAPIEN 3 valve. Aortic valve calcium score 5172 consistent with severe aortic stenosis. 2. Sufficient coronary to annulus distance. 3. Optimum Fluoroscopic Angle for Delivery:  RAO 4 CAU 43. 4. No thrombus in the left atrial appendage. Electronically Signed   By: Tobias Alexander   On: 03/11/2019 12:48   Result Date: 03/11/2019 EXAM: OVER-READ INTERPRETATION  CT CHEST The following report is an over-read performed by radiologist Dr. Trudie Reed of [facility] on 03/11/2019. This over-read does not include interpretation of cardiac or coronary anatomy or pathology. The coronary calcium score/coronary CTA interpretation by the cardiologist is attached. COMPARISON:  Chest CT 01/30/2018. FINDINGS: Extracardiac findings will be described separately under dictation for contemporaneously obtained CTA chest, abdomen and pelvis. IMPRESSION: Please see separate dictation for contemporaneously obtained CTA chest, abdomen and pelvis dated 03/11/2019 for full description of relevant extracardiac findings. Electronically Signed: By: Trudie Reed M.D. On: 03/11/2019 11:30   Dg Chest Port 1 View  Result Date: 03/17/2019 CLINICAL DATA:  Status post TAVR, history of aortic stenosis EXAM: PORTABLE CHEST 1 VIEW COMPARISON:  03/16/2019 FINDINGS: Low volume, rotated AP portable examination interval placement of aortic valve stent endograft, which projects in the vicinity of the aortic valve, although is  in an unusual inferiorly tilted orientation, perhaps exaggerated by radiographic technique. Correlate with intraprocedural imaging and echocardiogram for proper placement. Unchanged cardiomegaly, aortic atherosclerosis, and chronic elevation of the left hemidiaphragm. No acute appearing airspace opacity IMPRESSION: Low volume, rotated AP portable examination interval placement of aortic valve stent endograft, which projects in the vicinity of the aortic valve, although is in an unusual inferiorly tilted orientation, perhaps exaggerated by radiographic technique. Correlate with intraprocedural imaging and echocardiogram for proper placement. Unchanged cardiomegaly, aortic atherosclerosis, and  chronic elevation of the left hemidiaphragm. No acute appearing airspace opacity Electronically Signed   By: Lauralyn PrimesAlex  Bibbey M.D.   On: 03/17/2019 16:59   Ct Angio Chest Aorta W/cm &/or Wo/cm  Result Date: 03/11/2019 CLINICAL DATA:  85 year old male with history of severe aortic stenosis. Preprocedural study prior to potential transcatheter aortic valve replacement (TAVR) procedure. EXAM: CT ANGIOGRAPHY CHEST, ABDOMEN AND PELVIS TECHNIQUE: Multidetector CT imaging through the chest, abdomen and pelvis was performed using the standard protocol during bolus administration of intravenous contrast. Multiplanar reconstructed images and MIPs were obtained and reviewed to evaluate the vascular anatomy. CONTRAST:  100mL OMNIPAQUE IOHEXOL 350 MG/ML SOLN COMPARISON:  Chest CT 01/30/2018. CT the abdomen and pelvis 12/03/2016. FINDINGS: CTA CHEST FINDINGS Cardiovascular: Heart size is normal. Small amount of pericardial fluid and/or thickening, unlikely to be of any hemodynamic significance at this time. No associated pericardial calcification. There is aortic atherosclerosis, as well as atherosclerosis of the great vessels of the mediastinum and the coronary arteries, including calcified atherosclerotic plaque in the left main and left  anterior descending coronary arteries. Ectasia of ascending thoracic aorta (4.0 cm in diameter). Severe thickening calcification of the aortic valve. Mediastinum/Lymph Nodes: No pathologically enlarged mediastinal or hilar lymph nodes. Esophagus is unremarkable in appearance. No axillary lymphadenopathy. Lungs/Pleura: Several small pulmonary nodules are noted in the right lung, largest of which is in the periphery of the right lower lobe (axial image 61 of series 17) measuring 10 x 8 mm (mean diameter of 9 mm). No acute consolidative airspace disease. No pleural effusions. Musculoskeletal/Soft Tissues: Chronic compression fracture of T8 with 90% loss of anterior vertebral body height. New compression fracture of T12 with 40% loss of anterior vertebral body height, which does not appear acute. There are no aggressive appearing lytic or blastic lesions noted in the visualized portions of the skeleton. CTA ABDOMEN AND PELVIS FINDINGS Hepatobiliary: No suspicious cystic or solid hepatic lesions. No intra or extrahepatic biliary ductal dilatation. Gallbladder is normal in appearance. Pancreas: No pancreatic mass. No pancreatic ductal dilatation. No pancreatic or peripancreatic fluid or inflammatory changes. Spleen: Unremarkable. Adrenals/Urinary Tract: Mild diffuse cortical atrophy in the left kidney. Right kidney and adrenal glands are normal in appearance. No hydroureteronephrosis. Urinary bladder is normal in appearance. Stomach/Bowel: Normal appearance of the stomach. No pathologic dilatation of small bowel or colon. Numerous colonic diverticulae are noted, without surrounding inflammatory changes to suggest an acute diverticulitis at this time. The appendix is not confidently identified and may be surgically absent. Regardless, there are no inflammatory changes noted adjacent to the cecum to suggest the presence of an acute appendicitis at this time. Vascular/Lymphatic: Aortic atherosclerosis with fusiform  aneurysmal dilatation of the infrarenal abdominal aorta which measures up to 3.7 x 4.0 cm. Vascular findings and measurements pertinent to potential TAVR procedure, as detailed below. No lymphadenopathy noted in the abdomen or pelvis. Reproductive: Prostate gland and seminal vesicles are unremarkable in appearance. Other: No significant volume of ascites.  No pneumoperitoneum. Musculoskeletal: Chronic compression fracture of L4 with 30% loss of anterior vertebral body height. There are no aggressive appearing lytic or blastic lesions noted in the visualized portions of the skeleton. VASCULAR MEASUREMENTS PERTINENT TO TAVR: AORTA: Minimal Aortic Diameter-20 x 14 mm Severity of Aortic Calcification-severe RIGHT PELVIS: Right Common Iliac Artery - Minimal Diameter-10.3 x 8.3 mm Tortuosity-moderate to severe Calcification-moderate Right External Iliac Artery - Minimal Diameter-8.3 x 7.2 mm Tortuosity-mild-to-moderate Calcification-mild Right Common Femoral Artery - Minimal Diameter-8.3 x 8.3 mm Tortuosity-mild Calcification-mild LEFT PELVIS:  Left Common Iliac Artery - Minimal Diameter-8.4 x 8.6 mm Tortuosity-mild Calcification-moderate Left External Iliac Artery - Minimal Diameter-7.7 x 7.9 mm Tortuosity-mild Calcification-mild Left Common Femoral Artery - Minimal Diameter-8.2 x 8.0 mm Tortuosity-mild Calcification-moderate Review of the MIP images confirms the above findings. IMPRESSION: 1. Vascular findings and measurements pertinent to potential TAVR procedure, as detailed above. 2. Severe thickening calcification of the aortic valve, compatible with the reported clinical history of aortic stenosis. 3. Aortic atherosclerosis, in addition to left main and left anterior descending coronary artery disease. There is also ectasia of the ascending thoracic aorta (4.0 cm in diameter), as well as fusiform aneurysmal dilatation of the infrarenal abdominal aorta (3.7 x 4.0 cm). Recommend followup by ultrasound in 2 years. This  recommendation follows ACR consensus guidelines: White Paper of the ACR Incidental Findings Committee II on Vascular Findings. J Am Coll Radiol 2013; 10:789-794. 4. Multiple small pulmonary nodules in the right lung, largest of which measures up to a mean diameter of 9 mm in the right lower lobe. Non-contrast chest CT at 3-6 months is recommended. If the nodules are stable at time of repeat CT, then future CT at 18-24 months (from today's scan) is considered optional for low-risk patients, but is recommended for high-risk patients. This recommendation follows the consensus statement: Guidelines for Management of Incidental Pulmonary Nodules Detected on CT Images: From the Fleischner Society 2017; Radiology 2017; 284:228-243. 5. Colonic diverticulosis without evidence of acute diverticulitis at this time. 6. Additional incidental findings, as above. Electronically Signed   By: Trudie Reed M.D.   On: 03/11/2019 12:27   Vas US Carotid  Result Date: 03/11/2019 Carotid Arterial Duplex Study Indications:       Pre-TAVR. Other Factors:     Aortic stenosis. Limitations:       Patient body habitus, patient anatomy, patient movement Comparison Study:  No prior studies. Performing Technologist: Chanda Busing RVT  Examination Guidelines: A complete evaluation includes B-mode imaging, spectral Doppler, color Doppler, and power Doppler as needed of all accessible portions of each vessel. Bilateral testing is considered an integral part of a complete examination. Limited examinations for reoccurring indications may be performed as noted.  Right Carotid Findings: +----------+--------+--------+--------+-----------------------+--------+             PSV cm/s EDV cm/s Stenosis Describe                Comments  +----------+--------+--------+--------+-----------------------+--------+  CCA Prox   72       10                                        tortuous  +----------+--------+--------+--------+-----------------------+--------+   CCA Distal 71       12                smooth and heterogenous           +----------+--------+--------+--------+-----------------------+--------+  ICA Prox   60       15                smooth and heterogenous           +----------+--------+--------+--------+-----------------------+--------+  ICA Distal 77       17                                                  +----------+--------+--------+--------+-----------------------+--------+  ECA        56       0                                                   +----------+--------+--------+--------+-----------------------+--------+ +----------+--------+-------+--------+-------------------+             PSV cm/s EDV cms Describe Arm Pressure (mmHG)  +----------+--------+-------+--------+-------------------+  Subclavian 88                                             +----------+--------+-------+--------+-------------------+ +---------+--------+--+--------+-+---------+  Vertebral PSV cm/s 30 EDV cm/s 6 Antegrade  +---------+--------+--+--------+-+---------+  Left Carotid Findings: +----------+--------+-------+--------+--------------------------------+--------+             PSV cm/s EDV     Stenosis Describe                         Comments                       cm/s                                                        +----------+--------+-------+--------+--------------------------------+--------+  CCA Prox   67       12               smooth and heterogenous          tortuous  +----------+--------+-------+--------+--------------------------------+--------+  CCA Distal 77       19               smooth and heterogenous                    +----------+--------+-------+--------+--------------------------------+--------+  ICA Prox   64       16               smooth, heterogenous and                                                         calcific                                   +----------+--------+-------+--------+--------------------------------+--------+  ICA Distal 69        20                                                          +----------+--------+-------+--------+--------------------------------+--------+  ECA        85       9                                                           +----------+--------+-------+--------+--------------------------------+--------+ +----------+--------+--------+--------+-------------------+  Subclavian PSV cm/s EDV cm/s Describe Arm Pressure (mmHG)  +----------+--------+--------+--------+-------------------+             91                                              +----------+--------+--------+--------+-------------------+ +---------+--------+--+--------+-+---------+  Vertebral PSV cm/s 39 EDV cm/s 8 Antegrade  +---------+--------+--+--------+-+---------+  Summary: Right Carotid: Velocities in the right ICA are consistent with a 1-39% stenosis. Left Carotid: Velocities in the left ICA are consistent with a 1-39% stenosis. Vertebrals: Bilateral vertebral arteries demonstrate antegrade flow. *See table(s) above for measurements and observations.  Electronically signed by Waverly Ferrari MD on 03/11/2019 at 6:29:41 AM.    Final    Ct Angio Abd/pel W/ And/or W/o  Result Date: 03/11/2019 CLINICAL DATA:  85 year old male with history of severe aortic stenosis. Preprocedural study prior to potential transcatheter aortic valve replacement (TAVR) procedure. EXAM: CT ANGIOGRAPHY CHEST, ABDOMEN AND PELVIS TECHNIQUE: Multidetector CT imaging through the chest, abdomen and pelvis was performed using the standard protocol during bolus administration of intravenous contrast. Multiplanar reconstructed images and MIPs were obtained and reviewed to evaluate the vascular anatomy. CONTRAST:  OMNIPAQUE IOHEXOL 350 MG/ML SOLN COMPARISON:  Chest CT 01/30/2018. CT the abdomen and pelvis 12/03/2016. FINDINGS: CTA CHEST FINDINGS Cardiovascular: Heart size is normal. Small amount of pericardial fluid and/or thickening, unlikely to be of any hemodynamic  significance at this time. No associated pericardial calcification. There is aortic atherosclerosis, as well as atherosclerosis of the great vessels of the mediastinum and the coronary arteries, including calcified atherosclerotic plaque in the left main and left anterior descending coronary arteries. Ectasia of ascending thoracic aorta (4.0 cm in diameter). Severe thickening calcification of the aortic valve. Mediastinum/Lymph Nodes: No pathologically enlarged mediastinal or hilar lymph nodes. Esophagus is unremarkable in appearance. No axillary lymphadenopathy. Lungs/Pleura: Several small pulmonary nodules are noted in the right lung, largest of which is in the periphery of the right lower lobe (axial image 61 of series 17) measuring 10 x 8 mm (mean diameter of 9 mm). No acute consolidative airspace disease. No pleural effusions. Musculoskeletal/Soft Tissues: Chronic compression fracture of T8 with 90% loss of anterior vertebral body height. New compression fracture of T12 with 40% loss of anterior vertebral body height, which does not appear acute. There are no aggressive appearing lytic or blastic lesions noted in the visualized portions of the skeleton. CTA ABDOMEN AND PELVIS FINDINGS Hepatobiliary: No suspicious cystic or solid hepatic lesions. No intra or extrahepatic biliary ductal dilatation. Gallbladder is normal in appearance. Pancreas: No pancreatic mass. No pancreatic ductal dilatation. No pancreatic or peripancreatic fluid or inflammatory changes. Spleen: Unremarkable. Adrenals/Urinary Tract: Mild diffuse cortical atrophy in the left kidney. Right kidney and adrenal glands are normal in appearance. No hydroureteronephrosis. Urinary bladder is normal in appearance. Stomach/Bowel: Normal appearance of the stomach. No pathologic dilatation of small bowel or colon. Numerous colonic diverticulae are noted, without surrounding inflammatory changes to suggest an acute diverticulitis at this time. The appendix  is not confidently identified and may be surgically absent. Regardless, there are no inflammatory changes noted adjacent to the cecum to suggest the presence of an acute appendicitis at this time. Vascular/Lymphatic: Aortic atherosclerosis with fusiform aneurysmal dilatation of the infrarenal abdominal aorta which measures up to 3.7 x 4.0 cm. Vascular findings and measurements pertinent to potential TAVR procedure, as detailed below. No  lymphadenopathy noted in the abdomen or pelvis. Reproductive: Prostate gland and seminal vesicles are unremarkable in appearance. Other: No significant volume of ascites.  No pneumoperitoneum. Musculoskeletal: Chronic compression fracture of L4 with 30% loss of anterior vertebral body height. There are no aggressive appearing lytic or blastic lesions noted in the visualized portions of the skeleton. VASCULAR MEASUREMENTS PERTINENT TO TAVR: AORTA: Minimal Aortic Diameter-20 x 14 mm Severity of Aortic Calcification-severe RIGHT PELVIS: Right Common Iliac Artery - Minimal Diameter-10.3 x 8.3 mm Tortuosity-moderate to severe Calcification-moderate Right External Iliac Artery - Minimal Diameter-8.3 x 7.2 mm Tortuosity-mild-to-moderate Calcification-mild Right Common Femoral Artery - Minimal Diameter-8.3 x 8.3 mm Tortuosity-mild Calcification-mild LEFT PELVIS: Left Common Iliac Artery - Minimal Diameter-8.4 x 8.6 mm Tortuosity-mild Calcification-moderate Left External Iliac Artery - Minimal Diameter-7.7 x 7.9 mm Tortuosity-mild Calcification-mild Left Common Femoral Artery - Minimal Diameter-8.2 x 8.0 mm Tortuosity-mild Calcification-moderate Review of the MIP images confirms the above findings. IMPRESSION: 1. Vascular findings and measurements pertinent to potential TAVR procedure, as detailed above. 2. Severe thickening calcification of the aortic valve, compatible with the reported clinical history of aortic stenosis. 3. Aortic atherosclerosis, in addition to left main and left anterior  descending coronary artery disease. There is also ectasia of the ascending thoracic aorta (4.0 cm in diameter), as well as fusiform aneurysmal dilatation of the infrarenal abdominal aorta (3.7 x 4.0 cm). Recommend followup by ultrasound in 2 years. This recommendation follows ACR consensus guidelines: White Paper of the ACR Incidental Findings Committee II on Vascular Findings. J Am Coll Radiol 2013; 10:789-794. 4. Multiple small pulmonary nodules in the right lung, largest of which measures up to a mean diameter of 9 mm in the right lower lobe. Non-contrast chest CT at 3-6 months is recommended. If the nodules are stable at time of repeat CT, then future CT at 18-24 months (from today's scan) is considered optional for low-risk patients, but is recommended for high-risk patients. This recommendation follows the consensus statement: Guidelines for Management of Incidental Pulmonary Nodules Detected on CT Images: From the Fleischner Society 2017; Radiology 2017; 284:228-243. 5. Colonic diverticulosis without evidence of acute diverticulitis at this time. 6. Additional incidental findings, as above. Electronically Signed   By: Trudie Reed M.D.   On: 03/11/2019 12:27   Disposition   Pt is being discharged home today in good condition.  Follow-up Plans & Appointments    Follow-up Information    [facility]. Go on 04/07/2019.   Why: @ 1pm for an echocardiogram. Please arrive @ 12:30pm. If you need to change this appointment please call [phone] Contact information: [address] [place] [phone]       Janetta Hora, PA-C. Go on 04/09/2019.   Specialties: Cardiology, Radiology Why: for VIRTUAL VISIT with Carlean Jews PA-C at 2:30pm. Please review information and consent about virtual visits in your discharge paperwork.  Contact information: [address] [place] [address] [phone]          Discharge Instructions     Diet - low sodium heart healthy   Complete by: As directed    Increase activity slowly   Complete by: As directed       Discharge Medications   Allergies as of 03/23/2019   No Known Allergies     Medication List    STOP taking these medications   naproxen sodium 220 MG tablet Commonly known as: ALEVE     TAKE these medications   acetaminophen 500 MG  tablet Commonly known as: TYLENOL Take 1,000 mg by mouth every 6 (six) hours as needed for headache (pain).   amLODipine 5 MG tablet Commonly known as: NORVASC Take 1 tablet (5 mg total) by mouth daily.   aspirin 81 MG chewable tablet Chew 1 tablet (81 mg total) by mouth daily.   atorvastatin 20 MG tablet Commonly known as: LIPITOR Take 1 tablet (20 mg total) by mouth daily at 6 PM.   clopidogrel 75 MG tablet Commonly known as: PLAVIX Take 1 tablet (75 mg total) by mouth daily with breakfast.   furosemide 20 MG tablet Commonly known as: LASIX Take 1 tablet (20 mg total) by mouth as needed. What changed:   when to take this  reasons to take this   lisinopril 10 MG tablet Commonly known as: ZESTRIL Take 1 tablet (10 mg total) by mouth daily.   loratadine 10 MG tablet Commonly known as: CLARITIN Take 1 tablet (10 mg total) by mouth daily. What changed:   when to take this  reasons to take this   Memantine HCl-Donepezil HCl 28-10 MG Cp24 Commonly known as: Namzaric Take 1 capsule by mouth daily.   multivitamin with minerals Tabs tablet Take 1 tablet by mouth daily.   sertraline 50 MG tablet Commonly known as: ZOLOFT Take 1 tablet (50 mg total) by mouth daily.           Outstanding Labs/Studies   BMET 1 week to check renal functioning and potassium with resumption of Lisinopril   Duration of Discharge Encounter   Greater than 30 minutes including physician time.  Byrd Hesselbach, PA-C 03/23/2019, 11:44 AM [phone]  Agree with note by Carlean Jews PA-C  Stable for  discharge today.  Status post TAVR.   His groins are stable.  He has been afebrile and COVID negative.  Okay to transfer to a skilled nursing facility.  Arrangements will be made to follow-up in the structural heart clinic.   Runell Gess, M.D., FACP, [facility] [address] [place], [place]  [address]  [phone] 03/23/2019 11:56 AM

## 2019-03-23 NOTE — Progress Notes (Signed)
Patient daughter [name] updated on patient being discharged today, all questions answered. Sherrel Shafer, Bettina Gavia RN

## 2019-03-24 DIAGNOSIS — N183 Chronic kidney disease, stage 3 (moderate): Secondary | ICD-10-CM | POA: Diagnosis not present

## 2019-03-24 DIAGNOSIS — I10 Essential (primary) hypertension: Secondary | ICD-10-CM | POA: Diagnosis not present

## 2019-03-24 DIAGNOSIS — G301 Alzheimer's disease with late onset: Secondary | ICD-10-CM | POA: Diagnosis not present

## 2019-03-24 DIAGNOSIS — I350 Nonrheumatic aortic (valve) stenosis: Secondary | ICD-10-CM | POA: Diagnosis not present

## 2019-03-25 ENCOUNTER — Telehealth: Payer: Self-pay | Admitting: Family Medicine

## 2019-03-25 ENCOUNTER — Ambulatory Visit: Payer: PPO | Admitting: Physician Assistant

## 2019-03-25 NOTE — Telephone Encounter (Signed)
Hinton Dyer at [facility] notified no immunizations on record for pt

## 2019-03-26 ENCOUNTER — Telehealth: Payer: PPO | Admitting: Physician Assistant

## 2019-03-26 ENCOUNTER — Encounter: Payer: Self-pay | Admitting: Thoracic Surgery (Cardiothoracic Vascular Surgery)

## 2019-03-26 DIAGNOSIS — R55 Syncope and collapse: Secondary | ICD-10-CM | POA: Diagnosis not present

## 2019-03-26 DIAGNOSIS — E785 Hyperlipidemia, unspecified: Secondary | ICD-10-CM | POA: Diagnosis not present

## 2019-03-26 DIAGNOSIS — I350 Nonrheumatic aortic (valve) stenosis: Secondary | ICD-10-CM | POA: Diagnosis not present

## 2019-03-26 DIAGNOSIS — I10 Essential (primary) hypertension: Secondary | ICD-10-CM | POA: Diagnosis not present

## 2019-03-30 ENCOUNTER — Encounter: Payer: Self-pay | Admitting: Family Medicine

## 2019-04-01 ENCOUNTER — Ambulatory Visit: Payer: PPO | Admitting: Physician Assistant

## 2019-04-02 ENCOUNTER — Ambulatory Visit: Payer: PPO | Admitting: "Endocrinology

## 2019-04-02 DIAGNOSIS — I350 Nonrheumatic aortic (valve) stenosis: Secondary | ICD-10-CM | POA: Diagnosis not present

## 2019-04-02 DIAGNOSIS — G301 Alzheimer's disease with late onset: Secondary | ICD-10-CM | POA: Diagnosis not present

## 2019-04-02 DIAGNOSIS — I10 Essential (primary) hypertension: Secondary | ICD-10-CM | POA: Diagnosis not present

## 2019-04-02 DIAGNOSIS — N183 Chronic kidney disease, stage 3 (moderate): Secondary | ICD-10-CM | POA: Diagnosis not present

## 2019-04-07 ENCOUNTER — Ambulatory Visit (HOSPITAL_COMMUNITY)
Admission: RE | Admit: 2019-04-07 | Discharge: 2019-04-07 | Disposition: A | Discharge status: Home / Self Care | Payer: PPO | Source: Ambulatory Visit | Attending: Physician Assistant | Admitting: Physician Assistant

## 2019-04-07 ENCOUNTER — Other Ambulatory Visit: Payer: Self-pay

## 2019-04-07 DIAGNOSIS — I350 Nonrheumatic aortic (valve) stenosis: Secondary | ICD-10-CM | POA: Diagnosis not present

## 2019-04-07 DIAGNOSIS — Z952 Presence of prosthetic heart valve: Secondary | ICD-10-CM

## 2019-04-07 NOTE — Progress Notes (Signed)
*  PRELIMINARY RESULTS* Echocardiogram 2D Echocardiogram has been performed.  Leavy Cella 04/07/2019, 2:30 PM

## 2019-04-08 ENCOUNTER — Other Ambulatory Visit (HOSPITAL_COMMUNITY): Payer: PPO

## 2019-04-09 ENCOUNTER — Encounter: Payer: PPO | Admitting: Physician Assistant

## 2019-04-09 ENCOUNTER — Other Ambulatory Visit: Payer: Self-pay

## 2019-04-09 NOTE — Progress Notes (Signed)
This encounter was created in error - please disregard.

## 2019-04-15 ENCOUNTER — Encounter: Payer: Self-pay | Admitting: Family Medicine

## 2019-04-15 NOTE — Progress Notes (Signed)
Assessment & Plan:  1. Severe aortic stenosis - Awaiting ECHO results from last week. Patient to keep cardiology appointment for 04/22/2019. Managed by cardiology.   2. AAA (abdominal aortic aneurysm) without rupture (HCC) - AAA 3.7 x 4.0 - recommend f/u US in 2 years.   3. Multiple pulmonary nodules - Multiple pulmonary nodules incidentally found on CTA chest on 03/11/2019; recommended for f/u non-contrast CT Chest in 3-6 months for close observation. - Education provided on pulmonary nodules.   4. CKD (chronic kidney disease) stage 3, GFR 30-59 ml/min (HCC) - BMP8+EGFR  5. Hematuria, unspecified type - Urinalysis, Complete - Urine Culture - Microscopic Examination  6. Urinary incontinence, unspecified type - Incontinence supplies ordered for patient. Urinary incontinence education provided. Discussed that this may resolve with treatment of UTI.  - Microscopic Examination  7. Incontinence of feces, unspecified fecal incontinence type - Incontinence supplies ordered for patient. Fecal incontinence education provided. Discussed that this may resolve with treatment of UTI and improvement in strength to allow him to get up by himself to go to the bathroom.   8. Acute cystitis with hematuria - Education provided on UTIs.  - ciprofloxacin (CIPRO) 500 MG tablet; Take 1 tablet (500 mg total) by mouth 2 (two) times daily for 10 days.  Dispense: 20 tablet; Refill: 0  9. Essential hypertension, benign - Advised them to monitor BP at least QOD and keep a log. Will reassess at follow-up in 2 weeks to see if he is still low.  - BMP8+EGFR  10. Physical deconditioning - Patient will be working with PT at home hopefully starting this week.    Return in about 2 weeks (around 04/30/2019) for re-check urine and BP.  Hendricks Limes, MSN, APRN, FNP-C [facility]  Subjective:    Patient ID: Dan Perez, male    DOB: [birth year], 85 y.o.   MRN: [id number]  Patient  Care Team: Dettinger, Fransisca Kaufmann, MD as PCP - General (Family Medicine) Herminio Commons, MD as PCP - Cardiology (Cardiology) Satira Sark, MD as Consulting Physician (Cardiology)   Chief Complaint:  Nursing home discharge, Hematuria, Urinary Incontinence, Depression, and Encopresis   HPI: Dan Perez is a 85 y.o. male presenting on 04/16/2019 for Nursing home discharge, Hematuria, Urinary Incontinence, Depression, and Encopresis  Patient is accompanied by his ex-wife, who he is okay with being present. She is currently his caregiver.   Patient recently discharged from the [facility]. He was admitted to the SNF to receive PT and OT as he was unsafe to return home from the hospital. Patient had TAVR on 03/17/2019 due to severe aortic stenosis with syncope.   He was to follow up with cardiology in 2 weeks with repeat echo, which was completed on 04/08/2019. His next appointment is scheduled for 04/22/2019. His ex-wife reports they have not yet heard anything regarding the results of his echo.   Patient stayed at the [facility] for less than a week (records not available). Staff noted him on the floor, covered in bowel. When the patient's daughter was notified she was became upset and pulled him out of the [facility]. He currently has home health through [facility] and will be starting physical therapy this week. His family has been going through a lot as his son-in- law just passed away last week at the age of 86 from pneumonia. Previously his daughter was in the hospital having a miscarriage with twins, thus why he did not start  with PT last week.  Patient is currently experiencing urinary and fecal incontinence that he was not experiencing prior to his hospitalization. Patient also has some bright red blood in his urine. Denies dysuria, urgency, and frequency. His ex-wife reports he has had a significant increase in confusion and some lower back pain.    Relevant past  medical, surgical, family and social history reviewed and updated as indicated. Interim medical history since our last visit reviewed.  Allergies and medications reviewed and updated.  DATA REVIEWED: CHART IN EPIC  Family History reviewed for pertinent findings.  ROS: Negative unless specifically indicated above in HPI.    Current Outpatient Medications:  .  acetaminophen (TYLENOL) 500 MG tablet, Take 1,000 mg by mouth every 6 (six) hours as needed for headache (pain)., Disp: , Rfl:  .  amLODipine (NORVASC) 5 MG tablet, Take 1 tablet (5 mg total) by mouth daily., Disp: 90 tablet, Rfl: 3 .  aspirin 81 MG chewable tablet, Chew 1 tablet (81 mg total) by mouth daily., Disp: 90 tablet, Rfl: 3 .  atorvastatin (LIPITOR) 20 MG tablet, Take 1 tablet (20 mg total) by mouth daily at 6 PM., Disp: 90 tablet, Rfl: 3 .  clopidogrel (PLAVIX) 75 MG tablet, Take 1 tablet (75 mg total) by mouth daily with breakfast., Disp: 90 tablet, Rfl: 3 .  furosemide (LASIX) 20 MG tablet, Take 1 tablet (20 mg total) by mouth as needed. (Patient taking differently: Take 20 mg by mouth daily as needed for fluid or edema. ), Disp: 90 tablet, Rfl: 3 .  lisinopril (ZESTRIL) 10 MG tablet, Take 1 tablet (10 mg total) by mouth daily., Disp: 90 tablet, Rfl: 3 .  loratadine (CLARITIN) 10 MG tablet, Take 1 tablet (10 mg total) by mouth daily. (Patient taking differently: Take 10 mg by mouth daily as needed for allergies. ), Disp: 30 tablet, Rfl: 11 .  Memantine HCl-Donepezil HCl (NAMZARIC) 28-10 MG CP24, Take 1 capsule by mouth daily., Disp: 90 capsule, Rfl: 3 .  Multiple Vitamin (MULTIVITAMIN WITH MINERALS) TABS tablet, Take 1 tablet by mouth daily., Disp: , Rfl:  .  sertraline (ZOLOFT) 50 MG tablet, Take 1 tablet (50 mg total) by mouth daily., Disp: 90 tablet, Rfl: 3 .  ciprofloxacin (CIPRO) 500 MG tablet, Take 1 tablet (500 mg total) by mouth 2 (two) times daily for 10 days., Disp: 20 tablet, Rfl: 0  No Known Allergies Past  Medical History:  Diagnosis Date  . AAA (abdominal aortic aneurysm) without rupture ([place])    a. [place] (11/03/2013): 3.9 x 3.3 cm; b. 01/2018 CT chest: mild dil of Abd Ao- 3.9cm.  . Chronic abdominal pain   . CKD (chronic kidney disease), stage III ([place])   . Coronary artery calcification    a. 01/2018 Chest CT: Cor Ca2+ noted.  . Delusional disorder ([place])    chronic  . Essential hypertension, benign   . History of goiter   . Multiple pulmonary nodules 03/11/2019   Recommend for follow-up non-contrast CT Chest in 3-6 months (06/11/2019 - 09/10/2019) for close observation.   . Paranoia ([place])   . Right inguinal hernia    to see Dr. Romona Curls for repair  . 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 Severe aortic stenosis    a. 06/2018 Echo: EF 60-65%, Gr1 DD. Mod to sev AS. At least mod MR; b. 01/2019 Echo: EF 55-60%. No rwma. Nl  RV fxn. Sev AoV Ca2+ w/ Sev AS. Mean grad only 65mHg but AoV area 0.75cm^2 (Vmax).    Past Surgical History:  Procedure Laterality Date  . CATARACT EXTRACTION, BILATERAL    . EYE SURGERY Left 2004   Cataract   . EYE SURGERY Right 2004   Cataract   . INGUINAL HERNIA REPAIR Right 12/17/2013   Procedure: HERNIA REPAIR INGUINAL ADULT;  Surgeon: WScherry Ran MD;  Location: [facility];  Service: General;  Laterality: Right;  . INTRAOPERATIVE TRANSTHORACIC ECHOCARDIOGRAM N/A 03/17/2019   Procedure: Intraoperative Transthoracic Echocardiogram;  Surgeon: MBurnell Blanks MD;  [facility] [facility]  Service: Open Heart Surgery;  Laterality: N/A;  . RIGHT HEART CATH AND CORONARY ANGIOGRAPHY N/A 03/10/2019   Procedure: RIGHT HEART CATH AND CORONARY ANGIOGRAPHY;  Surgeon: MBurnell Blanks MD;  [facility];  Service: Cardiovascular;  Laterality: N/A;  . THYROIDECTOMY  03/02/2011   Left lobectomy and isthmusectomy -   . TRANSCATHETER  AORTIC VALVE REPLACEMENT, TRANSFEMORAL N/A 03/17/2019   Procedure: TRANSCATHETER AORTIC VALVE REPLACEMENT, TRANSFEMORAL;  Surgeon: MBurnell Blanks MD;  [facility]  Service: Open Heart Surgery;  Laterality: N/A;    Social History   Socioeconomic History  . Marital status: Divorced    Spouse name: Not on file  . Number of children: 5  . Years of education: 137 . Highest education level: Associate degree: occupational, tHotel manager or vocational program  Occupational History  . Not on file  Social Needs  . Financial resource strain: Not very hard  . Food insecurity    Worry: Never true    Inability: Never true  . Transportation needs    Medical: No    Non-medical: No  Tobacco Use  . Smoking status: Former Smoker    Quit date: 08/12/1983    Years since quitting: 35.7  . Smokeless tobacco: Never Used  Substance and Sexual Activity  . Alcohol use: No  . Drug use: No  . Sexual activity: Not Currently  Lifestyle  . Physical activity    Days per week: 0 days    Minutes per session: 0 min  . Stress: Only a little  Relationships  . Social connections    Talks on phone: More than three times a week    Gets together: More than three times a week    Attends religious service: 1 to 4 times per year    Active member of club or organization: No    Attends meetings of clubs or organizations: Never    Relationship status: Divorced  . Intimate partner violence    Fear of current or ex partner: No    Emotionally abused: No    Physically abused: No    Forced sexual activity: No  Other Topics Concern  . Not on file  Social History Narrative   Patient is divorced and lives in a one story home by himself. He has neighbors that visit and his children visit often and take him out to eat. He is retired and spent about 8 years in the [employer] He is able to read/write and speak [place]         Objective:    BP (!) 103/56   Pulse 72   Temp 99.8 F (37.7 C) (Tympanic)    Ht 6' (1.829 m)   Wt 139 lb 3.2 oz (63.1 kg)   SpO2 97%   BMI 18.88 kg/m    Physical Exam Vitals signs reviewed.  Constitutional:  General: He is not in acute distress.    Appearance: Normal appearance. He is underweight. He is not ill-appearing, toxic-appearing or diaphoretic.  HENT:     Head: Normocephalic and atraumatic.  Eyes:     General: No scleral icterus.       Right eye: No discharge.        Left eye: No discharge.     Conjunctiva/sclera: Conjunctivae normal.  Neck:     Musculoskeletal: Normal range of motion.  Cardiovascular:     Rate and Rhythm: Normal rate and regular rhythm.     Heart sounds: Normal heart sounds. No murmur. No friction rub. No gallop.   Pulmonary:     Effort: Pulmonary effort is normal. No respiratory distress.     Breath sounds: Normal breath sounds. No stridor. No wheezing, rhonchi or rales.  Abdominal:     Tenderness: There is no right CVA tenderness or left CVA tenderness.  Musculoskeletal: Normal range of motion.  Skin:    General: Skin is warm and dry.  Neurological:     Mental Status: He is alert and oriented to person, place, and time. Mental status is at baseline.     Motor: Weakness present.     Gait: Gait abnormal (use of wheelchair for this visit).  Psychiatric:        Attention and Perception: Attention and perception normal.        Mood and Affect: Mood normal.        Speech: Speech normal.        Behavior: Behavior normal.        Thought Content: Thought content normal.        Cognition and Memory: Cognition is impaired. Memory is impaired.        Judgment: Judgment normal.     Lab Results  Component Value Date   TSH 1.240 03/07/2019   Lab Results  Component Value Date   WBC 7.9 03/21/2019   HGB 10.2 (L) 03/21/2019   HCT 30.6 (L) 03/21/2019   MCV 88.2 03/21/2019   PLT 166 03/21/2019   Lab Results  Component Value Date   NA 130 (L) 03/20/2019   K 4.6 03/20/2019   CO2 27 03/20/2019   GLUCOSE 128 (H)  03/20/2019   BUN 29 (H) 03/20/2019   CREATININE 1.23 03/20/2019   BILITOT 0.4 03/17/2019   ALKPHOS 58 03/17/2019   AST 21 03/17/2019   ALT 17 03/17/2019   PROT 6.3 (L) 03/17/2019   ALBUMIN 3.0 (L) 03/17/2019   CALCIUM 8.3 (L) 03/20/2019   ANIONGAP 7 03/20/2019   Lab Results  Component Value Date   HGBA1C 5.7 (H) 03/17/2019

## 2019-04-16 ENCOUNTER — Encounter: Payer: Self-pay | Admitting: Family Medicine

## 2019-04-16 ENCOUNTER — Ambulatory Visit (INDEPENDENT_AMBULATORY_CARE_PROVIDER_SITE_OTHER): Payer: PPO | Admitting: Family Medicine

## 2019-04-16 ENCOUNTER — Other Ambulatory Visit: Payer: Self-pay

## 2019-04-16 VITALS — BP 103/56 | HR 72 | Temp 99.8°F | Ht 72.0 in | Wt 139.2 lb

## 2019-04-16 DIAGNOSIS — I714 Abdominal aortic aneurysm, without rupture, unspecified: Secondary | ICD-10-CM

## 2019-04-16 DIAGNOSIS — N183 Chronic kidney disease, stage 3 unspecified: Secondary | ICD-10-CM

## 2019-04-16 DIAGNOSIS — R159 Full incontinence of feces: Secondary | ICD-10-CM

## 2019-04-16 DIAGNOSIS — R319 Hematuria, unspecified: Secondary | ICD-10-CM

## 2019-04-16 DIAGNOSIS — R5381 Other malaise: Secondary | ICD-10-CM | POA: Diagnosis not present

## 2019-04-16 DIAGNOSIS — I350 Nonrheumatic aortic (valve) stenosis: Secondary | ICD-10-CM | POA: Diagnosis not present

## 2019-04-16 DIAGNOSIS — I10 Essential (primary) hypertension: Secondary | ICD-10-CM | POA: Diagnosis not present

## 2019-04-16 DIAGNOSIS — R32 Unspecified urinary incontinence: Secondary | ICD-10-CM | POA: Diagnosis not present

## 2019-04-16 DIAGNOSIS — N3001 Acute cystitis with hematuria: Secondary | ICD-10-CM | POA: Diagnosis not present

## 2019-04-16 DIAGNOSIS — R918 Other nonspecific abnormal finding of lung field: Secondary | ICD-10-CM | POA: Diagnosis not present

## 2019-04-16 LAB — URINALYSIS, COMPLETE
Bilirubin, UA: NEGATIVE
Glucose, UA: NEGATIVE
Ketones, UA: NEGATIVE
Nitrite, UA: NEGATIVE
Protein,UA: NEGATIVE
Specific Gravity, UA: 1.025 (ref 1.005–1.030)
Urobilinogen, Ur: 0.2 mg/dL (ref 0.2–1.0)
pH, UA: 5.5 (ref 5.0–7.5)

## 2019-04-16 LAB — MICROSCOPIC EXAMINATION
Renal Epithel, UA: NONE SEEN /hpf
WBC, UA: >30 /hpf — AB (ref 0–5)

## 2019-04-16 MED ORDER — CIPROFLOXACIN HCL 500 MG PO TABS: 500.0000 mg | ORAL_TABLET | Freq: Two times a day (BID) | ORAL | 0 refills | Status: AC

## 2019-04-16 NOTE — Patient Instructions (Addendum)
Pulmonary Nodule A pulmonary nodule is a small, round growth of tissue in the lung. It is sometimes referred to as a "shadow" or "spot on the lung." Nodules range in size from less than 1/5 of an inch (4 mm) to a little bigger than an inch (30 mm). Pulmonary nodules can be either noncancerous (benign) or cancerous (malignant). Most are noncancerous. Smaller nodules in people who do not smoke and do not have any other risk factors for lung cancer are more likely to be noncancerous. Larger, irregular nodules in people who smoke or who have a strong family history of lung cancer are more likely to be cancerous. What are the causes? This condition may be caused by:  A bacterial, fungal, or viral infection, such as tuberculosis. The infection is usually an old and inactive one.  A noncancerous mass of tissue.  Inflammation from conditions such as rheumatoid arthritis.  Abnormal blood vessels in the lungs.  Cancerous tissue, such as lung cancer or a cancer in another part of the body that has spread to the lung. What are the signs or symptoms? This condition usually does not cause symptoms. If symptoms appear, they are usually related to the underlying cause. For example, if the condition is caused by an infection, you may have a cough or fever. How is this diagnosed? This condition is usually diagnosed with an X-ray or CT scan. To help determine whether a pulmonary nodule is benign or malignant, your health care provider will:  Take your medical history.  Perform a physical exam.  Order tests, including: ? Blood tests. ? A skin test called a tuberculin test. This test is done to check if you have been exposed to the germ that causes tuberculosis. ? Chest X-rays. ? A CT scan. This test shows smaller pulmonary nodules more clearly and with more detail than an X-ray. ? A positron emission tomography (PET) scan. This test is done to check if the nodule is cancerous. During the test, a safe  amount of a radioactive substance is injected into the bloodstream. Then a picture is taken. ? Biopsy. In this test, a tiny piece of the pulmonary nodule is removed and then examined under a microscope. How is this treated? Treatment for this condition depends on whether the pulmonary nodule is malignant or benign as well as your risk of getting cancer.  Noncancerous nodules usually do not need to be treated, but they may need to be monitored with CT scans. If a CT scan shows that the pulmonary nodule got bigger, more tests may be done.  Some nodules need to be removed. If this is the case, you may have a procedure called a thoractomy. During the procedure, your health care provider will make an incision in your chest and remove the part of the lung where the nodule is located. Follow these instructions at home:   Take over-the-counter and prescription medicines only as told by your health care provider.  Do not use any products that contain nicotine or tobacco, such as cigarettes and e-cigarettes. If you need help quitting, ask your health care provider.  Keep all follow-up visits as told by your health care provider. This is important. Contact a health care provider if:  You have trouble breathing when you are active.  You feel sick or unusually tired.  You do not feel like eating.  You lose weight without trying.  You develop chills or night sweats. Get help right away if:  You cannot catch your breath.  You begin wheezing.  You cannot stop coughing.  You cough up blood.  You become dizzy or feel like you are going to faint.  You have sudden chest pain.  You have a fever or persistent symptoms for more than 2-3 days.  You have a fever and your symptoms suddenly get worse. Summary  A pulmonary nodule is a small, round growth of tissue in the lung. Most pulmonary nodules are noncancerous.  This condition is usually diagnosed with an X-ray or CT scan.  Common causes  of pulmonary nodules include infection, inflammation, and noncancerous growths.  Though less common, if a nodule is found to be cancerous, you will need specific diagnostic tests and treatment options as directed by your medical provider.  Treatment for this condition depends on whether the pulmonary nodule is benign or malignant as well as your risk of getting cancer. This information is not intended to replace advice given to you by your health care provider. Make sure you discuss any questions you have with your health care provider. Document Released: 07/01/2009 Document Revised: 09/27/2017 Document Reviewed: 10/02/2016 Elsevier Patient Education  2020 [employer]   Urinary Tract Infection, Adult A urinary tract infection (UTI) is an infection of any part of the urinary tract. The urinary tract includes:  The kidneys.  The ureters.  The bladder.  The urethra. These organs make, store, and get rid of pee (urine) in the body. What are the causes? This is caused by germs (bacteria) in your genital area. These germs grow and cause swelling (inflammation) of your urinary tract. What increases the risk? You are more likely to develop this condition if:  You have a small, thin tube (catheter) to drain pee.  You cannot control when you pee or poop (incontinence).  You are male, and: ? You use these methods to prevent pregnancy: ? A medicine that kills sperm (spermicide). ? A device that blocks sperm (diaphragm). ? You have low levels of a male hormone (estrogen). ? You are pregnant.  You have genes that add to your risk.  You are sexually active.  You take antibiotic medicines.  You have trouble peeing because of: ? A prostate that is bigger than normal, if you are male. ? A blockage in the part of your body that drains pee from the bladder (urethra). ? A kidney stone. ? A nerve condition that affects your bladder (neurogenic bladder). ? Not getting enough to drink.  ? Not peeing often enough.  You have other conditions, such as: ? Diabetes. ? A weak disease-fighting system (immune system). ? Sickle cell disease. ? Gout. ? Injury of the spine. What are the signs or symptoms? Symptoms of this condition include:  Needing to pee right away (urgently).  Peeing often.  Peeing small amounts often.  Pain or burning when peeing.  Blood in the pee.  Pee that smells bad or not like normal.  Trouble peeing.  Pee that is cloudy.  Fluid coming from the vagina, if you are male.  Pain in the belly or lower back. Other symptoms include:  Throwing up (vomiting).  No urge to eat.  Feeling mixed up (confused).  Being tired and grouchy (irritable).  A fever.  Watery poop (diarrhea). How is this treated? This condition may be treated with:  Antibiotic medicine.  Other medicines.  Drinking enough water. Follow these instructions at home:  Medicines  Take over-the-counter and prescription medicines only as told by your doctor.  If you were prescribed an antibiotic  medicine, take it as told by your doctor. Do not stop taking it even if you start to feel better. General instructions  Make sure you: ? Pee until your bladder is empty. ? Do not hold pee for a long time. ? Empty your bladder after sex. ? Wipe from front to back after pooping if you are a male. Use each tissue one time when you wipe.  Drink enough fluid to keep your pee pale yellow.  Keep all follow-up visits as told by your doctor. This is important. Contact a doctor if:  You do not get better after 1-2 days.  Your symptoms go away and then come back. Get help right away if:  You have very bad back pain.  You have very bad pain in your lower belly.  You have a fever.  You are sick to your stomach (nauseous).  You are throwing up. Summary  A urinary tract infection (UTI) is an infection of any part of the urinary tract.  This condition is caused by  germs in your genital area.  There are many risk factors for a UTI. These include having a small, thin tube to drain pee and not being able to control when you pee or poop.  Treatment includes antibiotic medicines for germs.  Drink enough fluid to keep your pee pale yellow. This information is not intended to replace advice given to you by your health care provider. Make sure you discuss any questions you have with your health care provider. Document Released: 02/20/2008 Document Revised: 08/21/2018 Document Reviewed: 03/13/2018 Elsevier Patient Education  2020 [employer]   Fecal Incontinence Fecal incontinence, also called accidental bowel leakage, is not being able to control your bowels. This condition happens because the nerves or muscles around the anus do not work the way they should. This affects their ability to hold stool (feces). What are the causes? This condition may be caused by:  Damage to the muscles at the end of the rectum (sphincter).  Damage to the nerves that control bowel movements.  Diarrhea.  Chronic constipation.  Pelvic floor dysfunction. This means the muscles in the pelvis do not work well.  Loss of bowel storage capacity. This occurs when the rectum can no longer stretch in size in order to store feces.  Inflammatory bowel disease (IBD), such as Crohn's disease.  Irritable bowel syndrome (IBS). What increases the risk? You are more likely to develop this condition if you:  Were born with bowels or a pelvis that did not form correctly.  Have had rectal surgery.  Have had radiation treatment for certain cancers.  Have been pregnant, had a vaginal delivery, or had surgery that damaged the pelvic floor muscles.  Had a complicated childbirth, spinal cord injury, or other trauma that caused nerve damage.  Have a condition that can affect nerve function, such as diabetes, Parkinson's disease, or multiple sclerosis.  Have a condition where the  rectum drops down into the anus or vagina (prolapse).  Are 31 years of age or older. What are the signs or symptoms? The main symptom of this condition is not being able to control your bowels. You also might not be able to get to the bathroom before a bowel movement. How is this diagnosed? This condition is diagnosed with a medical history and physical exam. You may also have other tests, including:  Blood tests.  Urine tests.  A rectal exam.  Ultrasound.  MRI.  Colonoscopy. This is an exam that looks at  your large intestine (colon).  Anal manometry. This is a test that measures the strength of the anal sphincter.  Anal electromyogram (EMG). This is a test that uses small electrodes to check for nerve damage. How is this treated? Treatment for this condition depends on the cause and severity. Treatment may also focus on addressing any underlying causes of this condition. Treatment may include:  Medicines. This may include medicines to: ? Prevent diarrhea. ? Help with constipation (bulk-forming laxatives). ? Treat any underlying conditions.  Biofeedback therapy. This can help to retrain muscles that are affected.  Fiber supplements. These can help manage your bowel movements.  Nerve stimulation.  Injectable gel to promote tissue growth and better muscle control.  Surgery. You may need: ? Sphincter repair surgery. ? Diversion surgery. This procedure lets feces pass out of your body through a hole in your abdomen. Follow these instructions at home: Eating and drinking   Follow instructions from your health care provider about any eating or drinking restrictions. ? Work with a dietitian to come up with a healthy diet that will help you avoid the foods that can make your condition worse. ? Keep a diet diary to find out which foods or drinks could be making your condition worse.  Drink enough fluid to keep your urine pale yellow. Lifestyle  Do not use any products that  contain nicotine or tobacco, such as cigarettes and e-cigarettes. If you need help quitting, ask your health care provider. This may help your condition.  If you are overweight, talk with your health care provider about how to safely lose weight. This may help your condition.  Increase your physical activity as told by your health care provider. This may help your condition. Always talk with your health care provider before starting a new exercise program.  Carry a change of clothes and supplies to clean up quickly if you have an episode of fetal incontinence.  Consider joining a fecal incontinence support group. You can find a support group online or in your local community. General instructions   Take over-the-counter and prescription medicines only as told by your health care provider. This includes any supplements.  Apply a moisture barrier, such as petroleum jelly, to your rectum. This protects the skin from irritation caused by ongoing leaking or diarrhea.  Tell your health care provider if you are upset or depressed about your condition.  Keep all follow-up visits as told by your health care provider. This is important. Where to find more information  [employer] Functional Gastrointestinal Disorders: iffgd.org  [employer]: patients.gi.org Contact a health care provider if:  You have a fever.  You have redness, swelling, or pain around your rectum.  Your pain is getting worse or you lose feeling in your rectal area.  You have blood in your stool.  You feel sad or hopeless.  You avoid social or work situations. Get help right away if:  You stop having bowel movements.  You cannot eat or drink without vomiting.  You have rectal bleeding that does not stop.  You have severe pain that is getting worse.  You have symptoms of dehydration, including: ? Sleepiness or fatigue. ? Producing little or no urine, tears, or sweat. ?  Dizziness. ? Dry mouth. ? Unusual irritability. ? Headache. ? Inability to think clearly. Summary  Fecal incontinence, also called accidental bowel leakage, is not being able to control your bowels. This condition happens because the nerves or muscles around the anus  do not work the way they should.  Treatment varies depending on the cause and severity of your condition. Treatment may also focus on addressing any underlying causes of this condition.  Follow instructions from your health care provider about any eating or drinking restrictions, lifestyle changes, and skin care.  Take over-the-counter and prescription medicines only as told by your health care provider. This includes any supplements.  Tell your health care provider if your symptoms worsen or if you are upset or depressed about your condition. This information is not intended to replace advice given to you by your health care provider. Make sure you discuss any questions you have with your health care provider. Document Released: 08/15/2004 Document Revised: 01/16/2018 Document Reviewed: 01/16/2018 Elsevier Patient Education  2020 [employer].

## 2019-04-17 LAB — BMP8+EGFR
BUN/Creatinine Ratio: 19 (ref 10–24)
BUN: 25 mg/dL (ref 8–27)
CO2: 25 mmol/L (ref 20–29)
Calcium: 8.9 mg/dL (ref 8.6–10.2)
Chloride: 98 mmol/L (ref 96–106)
Creatinine, Ser: 1.32 mg/dL — ABNORMAL HIGH (ref 0.76–1.27)
GFR calc Af Amer: 57 mL/min/{1.73_m2} — ABNORMAL LOW (ref >59)
GFR calc non Af Amer: 49 mL/min/{1.73_m2} — ABNORMAL LOW (ref >59)
Glucose: 88 mg/dL (ref 65–99)
Potassium: 4.4 mmol/L (ref 3.5–5.2)
Sodium: 137 mmol/L (ref 134–144)

## 2019-04-17 NOTE — Progress Notes (Signed)
[facility]   MULTIDISCIPLINARY HEART VALVE TEAM  Virtual Visit via Telephone Note   This visit type was conducted due to national recommendations for restrictions regarding the COVID-19 Pandemic (e.g. social distancing) in an effort to limit this patient's exposure and mitigate transmission in our community.  Due to his co-morbid illnesses, this patient is at least at moderate risk for complications without adequate follow up.  This format is felt to be most appropriate for this patient at this time.  The patient did not have access to video technology/had technical difficulties with video requiring transitioning to audio format only (telephone).  All issues noted in this document were discussed and addressed.  No physical exam could be performed with this format.  Please refer to the patient's chart for his  consent to telehealth for [facility].   Evaluation Performed:  Follow-up visit  Date:  04/22/2019   ID:  Dan Perez, DOB 30-Nov-1931, MRN [id number]  Patient Location: Home Provider Location: Office  PCP:  Dettinger, Elige RadonJoshua A, MD  Cardiologist:  Prentice DockerSuresh Koneswaran, MD / Dr. Clifton JamesMcAlhany & Dr. Cornelius Moraswen (TAVR)  Chief Complaint:  1 month s/p TAVR  History of Present Illness:    Dan BrimLawrence E Vaneaton is a 85 y.o. male with a history of pectus excavatum, scoliosis, HTN, AAA, CKD stage III, anemia,delusion disorder,dementia, thyroidectomy w/ iatrogenic hypothyroidism,moderate MR and severe aortic stenosis s/p TAVR (03/17/19) who presents for follow up.   The patient does not have symptoms concerning for COVID-19 infection (fever, chills, cough, or new shortness of breath).   Patient states that he was first told he had a heart murmur in the 1950s when he underwent physical examination at the time he joined the [employer]. In 2015 he underwent transthoracic echocardiogram and was referred for preoperative cardiac clearance prior to thyroidectomy because presence of  a heart murmur. Echo at that time revealed mild aortic stenosis with normal left ventricular systolic function. In 2018 he was referred to Dr. Purvis SheffieldKoneswaran for evaluation by his primary care physician to establish long-term follow-up for aortic stenosis. Echocardiogram performed at that time revealed normal left ventricular systolic function with moderate aortic stenosis and moderate aortic insufficiency. Continued follow-up was recommended. In 2019 the patient was elevated evaluated in the emergency department for atypical chest pain in the setting of severe hypertension. Troponins were normal. He was mildly anemic. He was prescribed tramadol for presumed musculoskeletal pain. Routine follow-up echocardiogram performed May 2020 revealed further progression of disease with severe aortic stenosis. Peak velocity across the aortic valve was reported 3.2 m/s corresponding to mean transvalvular gradient 24 mmHg, but the DVI was notably quite low at 0.23,stroke-volume index 31.93, and aortic valve area calculated 0.75 cm, consistent with paradoxical low gradient with preserved ejection fraction severe aortic stenosis. The patient was evaluated by Dr. Purvis SheffieldKoneswaran on February 24, 2019 and reported worsening exertional shortness of breath as well as occasional spells of "weakness and paleness" per the patient's daughters recollection. Given progressive symptoms and severe aortic stenosis, he was referred to Dr. Clifton JamesMcAlhany for TAVR evaluation on 03/23/2019. However, on March 06, 2019 the patient suffered a syncopal event and fell down striking his head, resulting in a laceration and contusion of the left side of his face. [facility] for evaluation where he was found to be in sinus rhythm. Troponin was mildly elevated 0.02. Head CT revealed no intracranial abnormality. His facial laceration was sutured closed. He was notably hypertensive at the  time. He was [facility]  for further work up and treatment.   He underwent TAVR work up in house and underwent successful TAVR with a 29 mm Edwards Sapien THV on 03/17/2019. Post op echo showed EF 60% with normally functioning TAVR with mean gradient of 9 mm Hg and no significant PVL. Of note, pre tavr cath showed mild non obst CAD. His stay was prolonged due to a fever. [facility] was ultimately [facility].   Today he presents for follow up. He is now back at home living with his wife. [facility] week because she found him on the ground covered in feces. No CP. He does have significant SOB and fatigue with exertion. No LE edema. He has chronic orthopnea but no PND. No dizziness or syncope. No blood in stool or urine. No palpitations. He mostly just sits around the house all day and draws [place] characters. Wife says this has been his functional status for a while. She does think he is slightly improved since TAVR. She thinks his dementia has really progressed since surgery. He is incontinent of stool and urine and they would like to have hospice come see him in the home.   Past Medical History:  Diagnosis Date   AAA (abdominal aortic aneurysm) without rupture (HCC)    a. US (11/03/2013): 3.9 x 3.3 cm; b. 01/2018 CT chest: mild dil of Abd Ao- 3.9cm.   Chronic abdominal pain    CKD (chronic kidney disease), stage III (HCC)    Coronary artery calcification    a. 01/2018 Chest CT: Cor Ca2+ noted.   Delusional disorder (HCC)    chronic   Essential hypertension, benign    History of goiter    Multiple pulmonary nodules 03/11/2019   Recommend for follow-up non-contrast CT Chest in 3-6 months (06/11/2019 - 09/10/2019) for close observation.    Paranoia (HCC)    Right inguinal hernia    to see Dr. Malvin JohnsBradford for repair   S/P TAVR (transcatheter aortic valve replacement) 03/17/2019   29 mm Edwards Sapien 3 transcatheter heart valve placed via percutaneous left transfemoral  approach   Severe aortic stenosis    a. 06/2018 Echo: EF 60-65%, Gr1 DD. Mod to sev AS. At least mod MR; b. 01/2019 Echo: EF 55-60%. No rwma. Nl RV fxn. Sev AoV Ca2+ w/ Sev AS. Mean grad only 24mmHg but AoV area 0.75cm^2 (Vmax).   Past Surgical History:  Procedure Laterality Date   CATARACT EXTRACTION, BILATERAL     EYE SURGERY Left 2004   Cataract    EYE SURGERY Right 2004   Cataract    INGUINAL HERNIA REPAIR Right 12/17/2013   Procedure: HERNIA REPAIR INGUINAL ADULT;  Surgeon: Marlane HatcherWilliam S Bradford, MD;  Location: [facility];  Service: General;  Laterality: Right;   INTRAOPERATIVE TRANSTHORACIC ECHOCARDIOGRAM N/A 03/17/2019   Procedure: Intraoperative Transthoracic Echocardiogram;  Surgeon: Kathleene HazelMcAlhany, Christopher D, MD;  [facility];  Service: Open Heart Surgery;  Laterality: N/A;   RIGHT HEART CATH AND CORONARY ANGIOGRAPHY N/A 03/10/2019   Procedure: RIGHT HEART CATH AND CORONARY ANGIOGRAPHY;  Surgeon: Kathleene HazelMcAlhany, Christopher D, MD;  Location: [facility] INVASIVE CV LAB;  Service: Cardiovascular;  Laterality: N/A;   THYROIDECTOMY  03/02/2011   Left lobectomy and isthmusectomy -    TRANSCATHETER AORTIC VALVE REPLACEMENT, TRANSFEMORAL N/A 03/17/2019   Procedure: TRANSCATHETER AORTIC VALVE REPLACEMENT, TRANSFEMORAL;  Surgeon: Kathleene HazelMcAlhany, Christopher D, MD;  Location: [facility] OR;  Service:  Open Heart Surgery;  Laterality: N/A;     Current Meds  Medication Sig   acetaminophen (TYLENOL) 500 MG tablet Take 1,000 mg by mouth every 6 (six) hours as needed for headache (pain).   amLODipine (NORVASC) 5 MG tablet Take 1 tablet (5 mg total) by mouth daily.   aspirin 81 MG chewable tablet Chew 1 tablet (81 mg total) by mouth daily.   atorvastatin (LIPITOR) 20 MG tablet Take 1 tablet (20 mg total) by mouth daily at 6 PM.   ciprofloxacin (CIPRO) 500 MG tablet Take 1 tablet (500 mg total) by mouth 2 (two) times daily for 10 days.   clopidogrel (PLAVIX) 75 MG tablet Take 1 tablet (75 mg total) by mouth daily with  breakfast.   furosemide (LASIX) 20 MG tablet Take 1 tablet (20 mg total) by mouth as needed.   lisinopril (ZESTRIL) 10 MG tablet Take 1 tablet (10 mg total) by mouth daily.   loratadine (CLARITIN) 10 MG tablet Take 1 tablet (10 mg total) by mouth daily.   Memantine HCl-Donepezil HCl (NAMZARIC) 28-10 MG CP24 Take 1 capsule by mouth daily.   Multiple Vitamin (MULTIVITAMIN WITH MINERALS) TABS tablet Take 1 tablet by mouth daily.   sertraline (ZOLOFT) 50 MG tablet Take 1 tablet (50 mg total) by mouth daily.     Allergies:   Patient has no known allergies.   Social History   Tobacco Use   Smoking status: Former Smoker    Quit date: 08/12/1983    Years since quitting: 35.7   Smokeless tobacco: Never Used  Substance Use Topics   Alcohol use: No   Drug use: No     Family Hx: The patient's family history includes Alzheimer's disease in his mother and sister; Cancer in his brother; Dementia in his mother; Diabetes in his brother, daughter, and mother; Heart attack in his brother and father; Hyperlipidemia in his daughter, daughter, and son; Hypertension in his daughter and son; Stroke in his mother; Thyroid cancer in his father and mother; Varicose Veins in his daughter and mother.  ROS:   Please see the history of present illness.    All other systems reviewed and are negative.   Prior CV studies:   The following studies were reviewed today:  [facility] 03/10/19 Conclusion   Ost RCA to Prox RCA lesion is 30% stenosed.  Prox Cx to Mid Cx lesion is 20% stenosed.  Mid LAD lesion is 20% stenosed.  1. Mild non-obstructive CAD 2. Unable to cross into the LV with a diagnostic catheter. The AL-1 was too short given his height and arm length). The long pigtail would not cross the valve given the severe angulation of the horizontal aortic root.   Recommendations: Will continue inpatient workup for TAVR. Will plans CT scans tomorrow if renal function is stable. Dr. Cornelius Moras will also  see over the next day.     _____________    TAVR OPERATIVE NOTE   Date of Procedure:03/17/2019  Preoperative Diagnosis:Severe Aortic Stenosis   Postoperative Diagnosis:Same   Procedure:   Transcatheter Aortic Valve Replacement - PercutaneousLeftTransfemoral Approach Edwards Sapien 3 THV (size 29mm, model # 9600TFX, serial # [id number])  Co-Surgeons:Christopher Clifton James, MD andClarence H. Cornelius Moras, MD  Anesthesiologist:Kevin Michelle Piper, MD  Echocardiographer:Mihai Croitoru, MD  Pre-operative Echo Findings: ? Severe aortic stenosis ? Normalleft ventricular systolic function  Post-operative Echo Findings: ? Noparavalvular leak ? Normalleft ventricular systolic function  _____________   Echo 03/18/19 IMPRESSIONS 1. The left ventricle has normal systolic function with an ejection  fraction of 60-65%. The cavity size was normal. There is mildly increased left ventricular wall thickness. Left ventricular diastolic Doppler parameters are consistent with impaired  relaxation. No evidence of left ventricular regional wall motion abnormalities. 2. The right ventricle has normal systolic function. The cavity was normal. There is no increase in right ventricular wall thickness. 3. Mild calcification of the mitral valve leaflet. There is mild mitral annular calcification present. No evidence of mitral valve stenosis. Mild mitral regurgitation. 4. Bioprosthetic aortic valve s/p TAVR. Mean gradient 9 mmHg with AVA 1.33 cm^2. No significant peri-valvular regurgitation noted. 5. The aortic root is normal in size and structure. 6. Normal IVC size. No complete TR doppler jet so unable to estimate PA systolic pressure.  _____________    Echo 04/07/19 IMPRESSIONS  1. The left ventricle has normal systolic function, with an ejection fraction of 55-60%. The cavity size  was normal. There is mildly increased left ventricular wall thickness. Left ventricular diastolic Doppler parameters are consistent with impaired  relaxation.  2. Small pericardial effusion.  3. The pericardial effusion is anterior to the right ventricle.  4. The mitral valve was not well visualized. Mild thickening of the mitral valve leaflet. Mild calcification of the mitral valve leaflet. No evidence of mitral valve stenosis.  5. The aortic valve was not well visualized. No stenosis of the aortic valve.  6. There is an Edwards Sapien 3 THV (size 29 mm, model # [id number]) in the AV position. The PW and CW Dopplers are not well aligned with flow leading to underestimation fo velocities and gradients. Overall there is no clear evidence of elevated gradients across the valve, no perivalvular regurgitation.  7. The aortic root is normal in size and structure.  8. Pulmonary hypertension is indeterminate, inadequate TR jet.   Labs/Other Tests and Data Reviewed:    EKG:  No ECG reviewed.  Recent Labs: 03/07/2019: TSH 1.240 03/17/2019: ALT 17 03/18/2019: Magnesium 2.2 03/21/2019: Hemoglobin 10.2; Platelets 166 04/16/2019: BUN 25; Creatinine, Ser 1.32; Potassium 4.4; Sodium 137   Recent Lipid Panel No results found for: CHOL, TRIG, HDL, CHOLHDL, LDLCALC, LDLDIRECT  Wt Readings from Last 3 Encounters:  04/22/19 138 lb (62.6 kg)  04/16/19 139 lb 3.2 oz (63.1 kg)  03/23/19 141 lb 11.2 oz (64.3 kg)     Objective:    Vital Signs:  BP 117/82    Pulse 68    Ht 6' (1.829 m)    Wt 138 lb (62.6 kg)    BMI 18.72 kg/m     ASSESSMENT & PLAN:    Severe AS s/p TAVR: echo 7/21 showed EF 55%, normally functioning TAVR with mean gradient 4.575mm Hg and no PVL (of note, the reading MD felt that the PW and CW Dopplers are not well aligned with flow leading to underestimation fo velocities and gradients. Overall there is no clear evidence of elevated gradients across the valve). KCCQ completed and copied below. He  has NYHA class III symptoms of dyspnea and fatigue. Patient's dementia has progressed rapidly, he is incontinent and and it sounds like he is having failure to thrive. They requested that hospice be sent out to their home, which I have ordered for them. Plavix can be discontinued after 6 months of therapy (08/2019) or sooner if comfort care is initiated.    CKD stage III: creat has been stable ~ 1.3   HTN: BP well controlled today   AAA:CTA A/P this admission showed stable infrarenal AAA measuring 3.7 x 4.0 cm.  Recommended for f/u US in 2 years.    Pulmonary nodules:incidental finding on CTA Chest. Recommended for f/u non-contrast CT Chest in 3-6 months for close observation. We will not set anything up given patient's wife wanting to discuss goals of care with hospice.  Failure to thrive: we were aware of the patient's dementia in the hospital, but patient and daughter told me that he lived a full life and was active [place].  During our visit today, his wife tells me that he has had a very poor functional status for quite some time.  The most that he does every day is sit in his chair and write [place] characters.  Occasionally, she can get him to come sit on the porch.  She is worried about a significant progression in his dementia since surgery.  He is also incontinent.  He remains significantly short of breath and fatigued with minimal exertion.  She has requested that we send hospice into the home to discuss goals of cares and comfort care.  I feel like this is reasonable.  He has a previously scheduled appointment with his primary cardiologist, Dr. Lorrine Kin next month which I have asked him to keep.  COVID-19 Education: The signs and symptoms of COVID-19 were discussed with the patient and how to seek care for testing (follow up with PCP or arrange E-visit).  The importance of social distancing was discussed today.  Time:   Today, I have spent 30 minutes with the  patient with telehealth technology discussing the above problems.     [place] Cardiomyopathy Questionnaire  KCCQ-12 04/22/2019  Ability to shower/bathe Moderately limited  Ability to walk 1 block Quite a bit limited  Ability to hurry/jog Other, Did not do  Edema feet/ankles/legs Never over the past 2 weeks  Limited by fatigue Several times a day  Limited by dyspnea Several times a day  Sitting up / on 3+ pillows Every night  Limited enjoyment of life Limited quite a bit  Rest of life w/ symptoms Mostly dissatisfied  Participation in hobbies N/A, did not do for other reasons  Participation in chores N/A, did not do for other reasons  Visiting family/friends N/A, did not do for other reasons     Medication Adjustments/Labs and Tests Ordered: Current medicines are reviewed at length with the patient today.  Concerns regarding medicines are outlined above.   Tests Ordered: No orders of the defined types were placed in this encounter.   Medication Changes: No orders of the defined types were placed in this encounter.   Disposition:  Follow up in 1 year(s)  Signed, Angelena Form, PA-C  04/22/2019 2:06 PM    [place]

## 2019-04-20 LAB — URINE CULTURE

## 2019-04-22 ENCOUNTER — Encounter: Payer: Self-pay | Admitting: Physician Assistant

## 2019-04-22 ENCOUNTER — Other Ambulatory Visit: Payer: Self-pay

## 2019-04-22 ENCOUNTER — Encounter: Payer: Self-pay | Admitting: *Deleted

## 2019-04-22 ENCOUNTER — Telehealth (INDEPENDENT_AMBULATORY_CARE_PROVIDER_SITE_OTHER): Payer: PPO | Admitting: Physician Assistant

## 2019-04-22 VITALS — BP 117/82 | HR 68 | Ht 72.0 in | Wt 138.0 lb

## 2019-04-22 DIAGNOSIS — R918 Other nonspecific abnormal finding of lung field: Secondary | ICD-10-CM | POA: Diagnosis not present

## 2019-04-22 DIAGNOSIS — I714 Abdominal aortic aneurysm, without rupture, unspecified: Secondary | ICD-10-CM

## 2019-04-22 DIAGNOSIS — I10 Essential (primary) hypertension: Secondary | ICD-10-CM

## 2019-04-22 DIAGNOSIS — Z952 Presence of prosthetic heart valve: Secondary | ICD-10-CM

## 2019-04-22 DIAGNOSIS — N183 Chronic kidney disease, stage 3 unspecified: Secondary | ICD-10-CM

## 2019-04-22 NOTE — Patient Instructions (Addendum)
Medication Instructions:  1) You may discontinue your Plavix after 09/05/2019.  If you need a refill on your cardiac medications before your next appointment, please call your pharmacy.   Lab work: None If you have labs (blood work) drawn today and your tests are completely normal, you will receive your results only by: Marland Kitchen MyChart Message (if you have MyChart) OR . A paper copy in the mail If you have any lab test that is abnormal or we need to change your treatment, we will call you to review the results.  Testing/Procedures: None  Follow-Up: Keep current appointments for echocardiogram and office appointment on June 30th, 2021.   Any Other Special Instructions Will Be Listed Below (If Applicable).

## 2019-04-27 NOTE — Progress Notes (Signed)
Assessment & Plan:  1. Recent urinary tract infection - Patient unable to void in office. A urine specimen cup was sent home with them and the ex-wife is going to return it when she is able to get a urine specimen.  - Urinalysis, Complete  2. Essential hypertension, benign - Well controlled on current regimen.   3. 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 Education provided. Discussed that we wanted to avoid sedating medications if possible and adjust behaviors to how Dan Perez is behaving.   4. Failure to thrive (0-17) - Patient is now on Hospice.    Return if symptoms worsen or fail to improve.  Deliah BostonBritney Joyce, MSN, APRN, FNP-C [facility]  Subjective:    Patient ID: Dan Perez, male    DOB: [birth year], 85 y.o.   MRN: [id number]  Patient Care Team: Dettinger, Elige RadonJoshua A, MD as PCP - General (Family Medicine) Laqueta LindenKoneswaran, Suresh A, MD as PCP - Cardiology (Cardiology) Jonelle SidleMcDowell, Samuel G, MD as Consulting Physician (Cardiology)   Chief Complaint:  Follow-up (urine and BP)   HPI: Dan Perez is a 85 y.o. male presenting on 04/30/2019 for Follow-up (urine and BP) Patient is accompanied by his ex-wife who is his primary caregiver at this time; he is okay with her being present.   Patient did complete all antibiotics. His ex-wife reports she did see blood at the tip of his penis yesterday when she was cleaning him up.   She has been keeping a log of BP readings at home. Systolic ranges 108-149 (149 was before medication; otherwise 130 is max). Diastolic ranges 55-80 with 90 on the day that BP was taken before medication. Heart rate ranges 62-66.    New complaints: Hospice has been initiated by cardiology due to failure to thrive. His ex-wife reports they have sent a nurse out and they now have a hospital bed and wheelchair. She reports he keeps  declined and she can hardly get him to drink anything. His intake is ~480 mL/day per her report. She is concerned that he needs medication for when he is "rowdy", "violent" and "aggressive". He has been pinching and has pulled her hair. She reports he is angered easily when watching the news.   Social history: currently living with his daughter and granddaughter; his ex-wife is his caregiver at this time. Daughter is having a hard time as she has recently miscarried and lost her husband unexpectedly.   Relevant past medical, surgical, family and social history reviewed and updated as indicated. Interim medical history since our last visit reviewed.  Allergies and medications reviewed and updated.  DATA REVIEWED: CHART IN EPIC  ROS: Negative unless specifically indicated above in HPI.    Current Outpatient Medications:  .  acetaminophen (TYLENOL) 500 MG tablet, Take 1,000 mg by mouth every 6 (six) hours as needed for headache (pain)., Disp: , Rfl:  .  amLODipine (NORVASC) 5 MG tablet, Take 1 tablet (5 mg total) by mouth daily., Disp: 90 tablet, Rfl: 3 .  aspirin 81 MG chewable tablet, Chew 1 tablet (81 mg total) by mouth daily., Disp: 90 tablet, Rfl: 3 .  atorvastatin (LIPITOR) 20 MG tablet, Take 1 tablet (20 mg total) by mouth daily at 6 PM., Disp: 90 tablet, Rfl: 3 .  clopidogrel (PLAVIX) 75 MG tablet, Take 1 tablet (75 mg total) by mouth daily with breakfast., Disp: 90 tablet, Rfl:  3 .  furosemide (LASIX) 20 MG tablet, Take 1 tablet (20 mg total) by mouth as needed., Disp: 90 tablet, Rfl: 3 .  lisinopril (ZESTRIL) 10 MG tablet, Take 1 tablet (10 mg total) by mouth daily., Disp: 90 tablet, Rfl: 3 .  loratadine (CLARITIN) 10 MG tablet, Take 1 tablet (10 mg total) by mouth daily., Disp: 30 tablet, Rfl: 11 .  Memantine HCl-Donepezil HCl (NAMZARIC) 28-10 MG CP24, Take 1 capsule by mouth daily., Disp: 90 capsule, Rfl: 3 .  Multiple Vitamin (MULTIVITAMIN WITH MINERALS) TABS tablet, Take 1 tablet by  mouth daily., Disp: , Rfl:  .  sertraline (ZOLOFT) 50 MG tablet, Take 1 tablet (50 mg total) by mouth daily., Disp: 90 tablet, Rfl: 3  No Known Allergies Past Medical History:  Diagnosis Date  . AAA (abdominal aortic aneurysm) without rupture ([place])    a. [place] (11/03/2013): 3.9 x 3.3 cm; b. 01/2018 CT chest: mild dil of Abd Ao- 3.9cm.  . Chronic abdominal pain   . CKD (chronic kidney disease), stage III ([place])   . Coronary artery calcification    a. 01/2018 Chest CT: Cor Ca2+ noted.  . Delusional disorder ([place])    chronic  . Essential hypertension, benign   . History of goiter   . Multiple pulmonary nodules 03/11/2019   Recommend for follow-up non-contrast CT Chest in 3-6 months (06/11/2019 - 09/10/2019) for close observation.   . Paranoia ([place])   . Right inguinal hernia    to see Dr. Romona Curls for repair  . 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 Severe aortic stenosis    a. 06/2018 Echo: EF 60-65%, Gr1 DD. Mod to sev AS. At least mod MR; b. 01/2019 Echo: EF 55-60%. No rwma. Nl RV fxn. Sev AoV Ca2+ w/ Sev AS. Mean grad only 59mmHg but AoV area 0.75cm^2 (Vmax).    Past Surgical History:  Procedure Laterality Date  . CATARACT EXTRACTION, BILATERAL    . EYE SURGERY Left 2004   Cataract   . EYE SURGERY Right 2004   Cataract   . INGUINAL HERNIA REPAIR Right 12/17/2013   Procedure: HERNIA REPAIR INGUINAL ADULT;  Surgeon: Scherry Ran, MD;  Location: [facility];  Service: General;  Laterality: Right;  . INTRAOPERATIVE TRANSTHORACIC ECHOCARDIOGRAM N/A 03/17/2019   Procedure: Intraoperative Transthoracic Echocardiogram;  Surgeon: Burnell Blanks, MD;  Location: [facility];  Service: Open Heart Surgery;  Laterality: N/A;  . RIGHT HEART CATH AND CORONARY ANGIOGRAPHY N/A 03/10/2019   Procedure: RIGHT HEART CATH AND CORONARY ANGIOGRAPHY;  Surgeon: Burnell Blanks, MD;   Location: [facility];  Service: Cardiovascular;  Laterality: N/A;  . THYROIDECTOMY  03/02/2011   Left lobectomy and isthmusectomy -   . TRANSCATHETER AORTIC VALVE REPLACEMENT, TRANSFEMORAL N/A 03/17/2019   Procedure: TRANSCATHETER AORTIC VALVE REPLACEMENT, TRANSFEMORAL;  Surgeon: Burnell Blanks, MD;  Location: [facility];  Service: Open Heart Surgery;  Laterality: N/A;    Social History   Socioeconomic History  . Marital status: Divorced    Spouse name: Not on file  . Number of children: 5  . Years of education: 19  . Highest education level: Associate degree: occupational, Hotel manager, or vocational program  Occupational History  . Not on file  Social Needs  . Financial resource strain: Not very hard  . Food insecurity    Worry: Never true    Inability: Never true  .  Transportation needs    Medical: No    Non-medical: No  Tobacco Use  . Smoking status: Former Smoker    Quit date: 08/12/1983    Years since quitting: 35.7  . Smokeless tobacco: Never Used  Substance and Sexual Activity  . Alcohol use: No  . Drug use: No  . Sexual activity: Not Currently  Lifestyle  . Physical activity    Days per week: 0 days    Minutes per session: 0 min  . Stress: Only a little  Relationships  . Social connections    Talks on phone: More than three times a week    Gets together: More than three times a week    Attends religious service: 1 to 4 times per year    Active member of club or organization: No    Attends meetings of clubs or organizations: Never    Relationship status: Divorced  . Intimate partner violence    Fear of current or ex partner: No    Emotionally abused: No    Physically abused: No    Forced sexual activity: No  Other Topics Concern  . Not on file  Social History Narrative   Patient is divorced and lives in a one story home by himself. He has neighbors that visit and his children visit often and take him out to eat. He is retired and spent about 8  years in the [employer]. He is able to read/write and speak [place].         Objective:    BP (!) 105/51   Pulse 71   Temp 98 F (36.7 C) (Temporal)    Physical Exam Vitals signs reviewed.  Constitutional:      General: He is not in acute distress.    Appearance: Normal appearance. He is not ill-appearing, toxic-appearing or diaphoretic.  HENT:     Head: Normocephalic and atraumatic.  Eyes:     General: No scleral icterus.       Right eye: No discharge.        Left eye: No discharge.     Conjunctiva/sclera: Conjunctivae normal.  Neck:     Musculoskeletal: Normal range of motion.  Cardiovascular:     Rate and Rhythm: Normal rate and regular rhythm.     Heart sounds: Normal heart sounds. No murmur. No friction rub. No gallop.   Pulmonary:     Effort: Pulmonary effort is normal. No respiratory distress.     Breath sounds: Normal breath sounds. No stridor. No wheezing, rhonchi or rales.  Musculoskeletal: Normal range of motion.  Skin:    General: Skin is warm and dry.  Neurological:     Mental Status: He is alert and oriented to person, place, and time. Mental status is at baseline.     Motor: Weakness present.     Gait: Gait abnormal (riding in wheelchair; unable to stand due to weakness).  Psychiatric:        Attention and Perception: Attention and perception normal.        Mood and Affect: Mood and affect normal.        Speech: Speech normal.        Behavior: Behavior normal. Behavior is cooperative.        Thought Content: Thought content normal.        Cognition and Memory: Cognition is impaired. Memory is impaired.     Lab Results  Component Value Date   TSH 1.240 03/07/2019   Lab Results  Component Value Date   WBC 7.9 03/21/2019   HGB 10.2 (L) 03/21/2019   HCT 30.6 (L) 03/21/2019   MCV 88.2 03/21/2019   PLT 166 03/21/2019   Lab Results  Component Value Date   NA 137 04/16/2019   K 4.4 04/16/2019   CO2 25 04/16/2019   GLUCOSE 88 04/16/2019   BUN 25  04/16/2019   CREATININE 1.32 (H) 04/16/2019   BILITOT 0.4 03/17/2019   ALKPHOS 58 03/17/2019   AST 21 03/17/2019   ALT 17 03/17/2019   PROT 6.3 (L) 03/17/2019   ALBUMIN 3.0 (L) 03/17/2019   CALCIUM 8.9 04/16/2019   ANIONGAP 7 03/20/2019   Lab Results  Component Value Date   HGBA1C 5.7 (H) 03/17/2019

## 2019-04-29 ENCOUNTER — Other Ambulatory Visit: Payer: Self-pay

## 2019-04-30 ENCOUNTER — Ambulatory Visit (INDEPENDENT_AMBULATORY_CARE_PROVIDER_SITE_OTHER): Payer: Medicare Other | Admitting: Family Medicine

## 2019-04-30 ENCOUNTER — Encounter: Payer: Self-pay | Admitting: Family Medicine

## 2019-04-30 VITALS — BP 105/51 | HR 71 | Temp 98.0°F

## 2019-04-30 DIAGNOSIS — Z87440 Personal history of urinary (tract) infections: Secondary | ICD-10-CM | POA: Diagnosis not present

## 2019-04-30 DIAGNOSIS — R6251 Failure to thrive (child): Secondary | ICD-10-CM

## 2019-04-30 DIAGNOSIS — I10 Essential (primary) hypertension: Secondary | ICD-10-CM

## 2019-04-30 DIAGNOSIS — F0280 Dementia in other diseases classified elsewhere without behavioral disturbance: Secondary | ICD-10-CM

## 2019-04-30 DIAGNOSIS — F22 Delusional disorders: Secondary | ICD-10-CM | POA: Diagnosis not present

## 2019-04-30 DIAGNOSIS — G309 Alzheimer's disease, unspecified: Secondary | ICD-10-CM | POA: Diagnosis not present

## 2019-04-30 DIAGNOSIS — R627 Adult failure to thrive: Secondary | ICD-10-CM | POA: Diagnosis not present

## 2019-04-30 NOTE — Patient Instructions (Addendum)
Dan Perez for dementia.   Dementia Caregiver Guide Dementia is a term used to describe a number of symptoms that affect memory and thinking. The most common symptoms include:  Memory loss.  Trouble with language and communication.  Trouble concentrating.  Poor judgment.  Problems with reasoning.  Child-like behavior and language.  Extreme anxiety.  Angry outbursts.  Wandering from home or public places. Dementia usually gets worse slowly over time. In the early stages, people with dementia can stay independent and safe with some help. In later stages, they need help with daily tasks such as dressing, grooming, and using the bathroom. How to help the person with dementia cope Dementia can be frightening and confusing. Here are some tips to help the person with dementia cope with changes caused by the disease. General tips  Keep the person on track with his or her routine.  Try to identify areas where the person may need help.  Be supportive, patient, calm, and encouraging.  Gently remind the person that adjusting to changes takes time.  Help with the tasks that the person has asked for help with.  Keep the person involved in daily tasks and decisions as much as possible.  Encourage conversation, but try not to get frustrated or harried if the person struggles to find words or does not seem to appreciate your help. Communication tips  When the person is talking or seems frustrated, make eye contact and hold the person's hand.  Ask specific questions that need yes or no answers.  Use simple words, short sentences, and a calm voice. Only give one direction at a time.  When offering choices, limit them to just 1 or 2.  Avoid correcting the person in a negative way.  If the person is struggling to find the right words, gently try to help him or her. How to recognize symptoms of stress Symptoms of stress in caregivers include:  Feeling frustrated or angry with the  person with dementia.  Denying that the person has dementia or that his or her symptoms will not improve.  Feeling hopeless and unappreciated.  Difficulty sleeping.  Difficulty concentrating.  Feeling anxious, irritable, or depressed.  Developing stress-related health problems.  Feeling like you have too little time for your own life. Follow these instructions at home:   Make sure that you and the person you are caring for: ? Get regular sleep. ? Exercise regularly. ? Eat regular, nutritious meals. ? Drink enough fluid to keep your urine clear or pale yellow. ? Take over-the-counter and prescription medicines only as told by your health care providers. ? Attend all scheduled health care appointments.  Join a support group with others who are caregivers.  Ask about respite care resources so that you can have a regular break from the stress of caregiving.  Look for signs of stress in yourself and in the person you are caring for. If you notice signs of stress, take steps to manage it.  Consider any safety risks and take steps to avoid them.  Organize medications in a pill box for each day of the week.  Create a plan to handle any legal or financial matters. Get legal or financial advice if needed.  Keep a calendar in a central location to remind the person of appointments or other activities. Tips for reducing the risk of injury  Keep floors clear of clutter. Remove rugs, magazine racks, and floor lamps.  Keep hallways well lit, especially at night.  Put a handrail and  nonslip mat in the bathtub or shower.  Put childproof locks on cabinets that contain dangerous items, such as medicines, alcohol, guns, toxic cleaning items, sharp tools or utensils, matches, and lighters.  Put the locks in places where the person cannot see or reach them easily. This will help ensure that the person does not wander out of the house and get lost.  Be prepared for emergencies. Keep a  list of emergency phone numbers and addresses in a convenient area.  Remove car keys and lock garage doors so that the person does not try to get in the car and drive.  Have the person wear a bracelet that tracks locations and identifies the person as having memory problems. This should be worn at all times for safety. Where to find support: Many individuals and organizations offer support. These include:  Support groups for people with dementia and for caregivers.  Counselors or therapists.  Home health care services.  Adult day care centers. Where to find more information Alzheimer's Association: [employer] Contact a health care provider if:  The person's health is rapidly getting worse.  You are no longer able to care for the person.  Caring for the person is affecting your physical and emotional health.  The person threatens himself or herself, you, or anyone else. Summary  Dementia is a term used to describe a number of symptoms that affect memory and thinking.  Dementia usually gets worse slowly over time.  Take steps to reduce the person's risk of injury, and to plan for future care.  Caregivers need support, relief from caregiving, and time for their own lives. This information is not intended to replace advice given to you by your health care provider. Make sure you discuss any questions you have with your health care provider. Document Released: 08/07/2016 Document Revised: 08/16/2017 Document Reviewed: 08/07/2016 Elsevier Patient Education  2020 [employer].

## 2019-05-04 ENCOUNTER — Other Ambulatory Visit: Payer: PPO

## 2019-05-04 DIAGNOSIS — Z87440 Personal history of urinary (tract) infections: Secondary | ICD-10-CM | POA: Diagnosis not present

## 2019-05-04 LAB — MICROSCOPIC EXAMINATION
Epithelial Cells (non renal): NONE SEEN /hpf (ref 0–10)
Mucus, UA: NONE SEEN
Renal Epithel, UA: NONE SEEN /hpf
WBC, UA: NONE SEEN /hpf (ref 0–5)

## 2019-05-04 LAB — URINALYSIS, COMPLETE
Bilirubin, UA: NEGATIVE
Glucose, UA: NEGATIVE
Ketones, UA: NEGATIVE
Leukocytes,UA: NEGATIVE
Nitrite, UA: NEGATIVE
Protein,UA: NEGATIVE
Specific Gravity, UA: 1.025 (ref 1.005–1.030)
Urobilinogen, Ur: 0.2 mg/dL (ref 0.2–1.0)
pH, UA: 6.5 (ref 5.0–7.5)

## 2019-05-06 ENCOUNTER — Encounter: Payer: Self-pay | Admitting: "Endocrinology

## 2019-05-06 ENCOUNTER — Other Ambulatory Visit: Payer: Self-pay

## 2019-05-06 ENCOUNTER — Ambulatory Visit (INDEPENDENT_AMBULATORY_CARE_PROVIDER_SITE_OTHER): Payer: PPO | Admitting: "Endocrinology

## 2019-05-06 VITALS — BP 102/62 | HR 80

## 2019-05-06 DIAGNOSIS — E890 Postprocedural hypothyroidism: Secondary | ICD-10-CM | POA: Diagnosis not present

## 2019-05-06 MED ORDER — LEVOTHYROXINE SODIUM 50 MCG PO TABS: 50.0000 ug | ORAL_TABLET | Freq: Every day | ORAL | 6 refills | Status: AC

## 2019-05-06 NOTE — Progress Notes (Signed)
Endocrinology Consult Note                                            05/06/2019, 2:05 PM   Subjective:    Patient ID: Dan Perez, male    DOB: [birth year], PCP Dettinger, Fransisca Kaufmann, MD   Past Medical History:  Diagnosis Date  . AAA (abdominal aortic aneurysm) without rupture ([place])    a. [place] (11/03/2013): 3.9 x 3.3 cm; b. 01/2018 CT chest: mild dil of Abd Ao- 3.9cm.  . Chronic abdominal pain   . CKD (chronic kidney disease), stage III ([place])   . Coronary artery calcification    a. 01/2018 Chest CT: Cor Ca2+ noted.  . Delusional disorder ([place])    chronic  . Essential hypertension, benign   . History of goiter   . Multiple pulmonary nodules 03/11/2019   Recommend for follow-up non-contrast CT Chest in 3-6 months (06/11/2019 - 09/10/2019) for close observation.   . Paranoia ([place])   . Right inguinal hernia    to see Dr. Romona Curls for repair  . 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 Severe aortic stenosis    a. 06/2018 Echo: EF 60-65%, Gr1 DD. Mod to sev AS. At least mod MR; b. 01/2019 Echo: EF 55-60%. No rwma. Nl RV fxn. Sev AoV Ca2+ w/ Sev AS. Mean grad only 58mmHg but AoV area 0.75cm^2 (Vmax).   Past Surgical History:  Procedure Laterality Date  . CATARACT EXTRACTION, BILATERAL    . EYE SURGERY Left 2004   Cataract   . EYE SURGERY Right 2004   Cataract   . INGUINAL HERNIA REPAIR Right 12/17/2013   Procedure: HERNIA REPAIR INGUINAL ADULT;  Surgeon: Scherry Ran, MD;  Location: [facility];  Service: General;  Laterality: Right;  . INTRAOPERATIVE TRANSTHORACIC ECHOCARDIOGRAM N/A 03/17/2019   Procedure: Intraoperative Transthoracic Echocardiogram;  Surgeon: Burnell Blanks, MD;  Location: [place];  Service: Open Heart Surgery;  Laterality: N/A;  . RIGHT HEART CATH AND CORONARY ANGIOGRAPHY N/A 03/10/2019   Procedure: RIGHT HEART CATH AND CORONARY  ANGIOGRAPHY;  Surgeon: Burnell Blanks, MD;  Location: [place] [facility];  Service: Cardiovascular;  Laterality: N/A;  . THYROIDECTOMY  03/02/2011   Left lobectomy and isthmusectomy -   . TRANSCATHETER AORTIC VALVE REPLACEMENT, TRANSFEMORAL N/A 03/17/2019   Procedure: TRANSCATHETER AORTIC VALVE REPLACEMENT, TRANSFEMORAL;  Surgeon: Burnell Blanks, MD;  Location: [place];  Service: Open Heart Surgery;  Laterality: N/A;   Social History   Socioeconomic History  . Marital status: Divorced    Spouse name: Not on file  . Number of children: 5  . Years of education: 65  . Highest education level: Associate degree: occupational, Hotel manager, or vocational program  Occupational History  . Not on file  Social Needs  . Financial resource strain: Not very hard  . Food insecurity    Worry: Never true    Inability: Never true  . Transportation needs    Medical: No    Non-medical: No  Tobacco Use  . Smoking status: Former Smoker    Quit date: 08/12/1983    Years since quitting: 35.7  . Smokeless tobacco: Never Used  Substance and Sexual Activity  . Alcohol use: No  . Drug use:  No  . Sexual activity: Not Currently  Lifestyle  . Physical activity    Days per week: 0 days    Minutes per session: 0 min  . Stress: Only a little  Relationships  . Social connections    Talks on phone: More than three times a week    Gets together: More than three times a week    Attends religious service: 1 to 4 times per year    Active member of club or organization: No    Attends meetings of clubs or organizations: Never    Relationship status: Divorced  Other Topics Concern  . Not on file  Social History Narrative   Patient is divorced and lives in a one story home by himself. He has neighbors that visit and his children visit often and take him out to eat. He is retired and spent about 8 years in the [employer]. He is able to read/write and speak [place].    Family History  Problem  Relation Age of Onset  . Heart attack Father   . Thyroid cancer Father   . Dementia Mother   . Stroke Mother   . Diabetes Mother   . Varicose Veins Mother   . Thyroid cancer Mother   . Alzheimer's disease Mother   . Diabetes Brother   . Diabetes Daughter   . Hyperlipidemia Daughter   . Hypertension Daughter   . Varicose Veins Daughter   . Hyperlipidemia Son   . Hypertension Son   . Hyperlipidemia Daughter   . Alzheimer's disease Sister   . Cancer Brother        thyroid  . Heart attack Brother    Outpatient Encounter Medications as of 05/06/2019  Medication Sig  . acetaminophen (TYLENOL) 500 MG tablet Take 1,000 mg by mouth every 6 (six) hours as needed for headache (pain).  Marland Kitchen. amLODipine (NORVASC) 5 MG tablet Take 1 tablet (5 mg total) by mouth daily.  Marland Kitchen. aspirin 81 MG chewable tablet Chew 1 tablet (81 mg total) by mouth daily.  Marland Kitchen. atorvastatin (LIPITOR) 20 MG tablet Take 1 tablet (20 mg total) by mouth daily at 6 PM.  . clopidogrel (PLAVIX) 75 MG tablet Take 1 tablet (75 mg total) by mouth daily with breakfast.  . furosemide (LASIX) 20 MG tablet Take 1 tablet (20 mg total) by mouth as needed.  Marland Kitchen. levothyroxine (SYNTHROID) 50 MCG tablet Take 1 tablet (50 mcg total) by mouth daily before breakfast.  . lisinopril (ZESTRIL) 10 MG tablet Take 1 tablet (10 mg total) by mouth daily.  Marland Kitchen. loratadine (CLARITIN) 10 MG tablet Take 1 tablet (10 mg total) by mouth daily.  . Memantine HCl-Donepezil HCl (NAMZARIC) 28-10 MG CP24 Take 1 capsule by mouth daily.  . Multiple Vitamin (MULTIVITAMIN WITH MINERALS) TABS tablet Take 1 tablet by mouth daily.  . sertraline (ZOLOFT) 50 MG tablet Take 1 tablet (50 mg total) by mouth daily.   No facility-administered encounter medications on file as of 05/06/2019.    ALLERGIES: No Known Allergies  VACCINATION STATUS: Immunization History  Administered Date(s) Administered  . Tdap 03/07/2019    HPI Dan Perez is 85 y.o. male who presents today with  a medical history as above. he is being seen in consultation for postsurgical hypothyroidism requested by Dettinger, Elige RadonJoshua A, MD.  He is status post total thyroidectomy for large retrosternal goiter in June 2012.  He was last seen in this clinic in 2014 when he was on 88 mcg of levothyroxine  with stable thyroid function test.  At that time he weighed 160 to 170 pounds.  His most recent documented weight is 138 pounds.  He has not returned for follow-up.  He is currently in hospice care due to multiple medical problems and failure to thrive.  He is being cared for by his ex-wife.  He is not currently on levothyroxine.  She reports he has been dealing with increasing fatigue, constipation, cold intolerance.  He does not have recent thyroid function tests.  In June 2020 his TSH was 1.24.  She does not know when he last took levothyroxine.   Review of Systems  Constitutional: He is wheelchair-bound, + significant current cognitive deficit.   +  fatigue, + subjective hypothermia Eyes: no blurry vision, no xerophthalmia ENT: no sore throat, no nodules palpated in throat, no dysphagia/odynophagia, no hoarseness Skin: no rashes Neurological: no tremors, no numbness, no tingling, no dizziness Psychiatric: + depression, + on and off agitation/anxiety  Objective:    BP 102/62   Pulse 80   Wt Readings from Last 3 Encounters:  04/22/19 138 lb (62.6 kg)  04/16/19 139 lb 3.2 oz (63.1 kg)  03/23/19 141 lb 11.2 oz (64.3 kg)    Physical Exam  Constitutional:  + Wheelchair-bound, significant cognitive deficit.  Responds to all simple commands.  ENT: moist mucous membranes, + thyroidectomy scar on anterior lower neck, - Gross thyromegaly CMP ( most recent) CMP     Component Value Date/Time   NA 137 04/16/2019 1139   K 4.4 04/16/2019 1139   CL 98 04/16/2019 1139   CO2 25 04/16/2019 1139   GLUCOSE 88 04/16/2019 1139   GLUCOSE 128 (H) 03/20/2019 1053   BUN 25 04/16/2019 1139   CREATININE 1.32 (H)  04/16/2019 1139   CALCIUM 8.9 04/16/2019 1139   PROT 6.3 (L) 03/17/2019 0609   PROT 7.1 04/22/2017 1019   ALBUMIN 3.0 (L) 03/17/2019 0609   ALBUMIN 4.3 04/22/2017 1019   AST 21 03/17/2019 0609   ALT 17 03/17/2019 0609   ALKPHOS 58 03/17/2019 0609   BILITOT 0.4 03/17/2019 0609   BILITOT 0.4 04/22/2017 1019   GFRNONAA 49 (L) 04/16/2019 1139   GFRAA 57 (L) 04/16/2019 1139   Lipid Panel  No results found for: CHOL, TRIG, HDL, CHOLHDL, VLDL, LDLCALC, LDLDIRECT    Lab Results  Component Value Date   TSH 1.240 03/07/2019   TSH 1.420 05/22/2017   TSH 1.440 04/22/2017   TSH 0.582 08/29/2011   TSH <0.008 (L) 03/02/2011   TSH 0.020 (L) 10/14/2010   TSH 0.012 (L) 09/08/2010   FREET4 0.98 08/29/2011   FREET4 0.83 10/14/2010           Assessment & Plan:   1. Postsurgical hypothyroidism -This patient has postsurgical hypothyroidism.  He underwent total thyroidectomy in June 2012 for large retrosternal goiter.  When he weighed 160 to 170 pounds, he was on levothyroxine 88 mcg p.o. daily.  Currently in hospice care, his ex-wife drove him to clinic.  She does not know when he last took levothyroxine. -He will still benefit and needs this hormone. -I discussed the need for him to resume his levothyroxine at a lower dose of 50 mcg p.o. daily before breakfast.  He weighs significantly less this time at 138 pounds.   -He will need TSH/free T4 3 months from now.  I have given instructions for hospice care to report the results and will advise over the phone if adjustment is needed.   - Time  spent with the patient: 30 minutes, of which >50% was spent in obtaining information about his symptoms, reviewing his previous labs/studies,  evaluations, and treatments, counseling his caretaker about his postsurgical hypothyroidism, and developing a plan  for  long term treatment .  Follow up plan: Return check TSH/Free T4 in 3 months and send results to review.Marquis Lunch.   Gebre Trino Higinbotham, MD [facility] [address] [place], [place] [address] Phone: [phone]  Fax: [phone]     05/06/2019, 2:05 PM  This note was partially dictated with voice recognition software. Similar sounding words can be transcribed inadequately or may not  be corrected upon review.

## 2019-05-11 ENCOUNTER — Telehealth: Payer: Self-pay | Admitting: "Endocrinology

## 2019-05-11 NOTE — Telephone Encounter (Signed)
Left msg for Dan Perez that lab order was given to family member at pts appt. And levothyroxine was sent to [facility]. I advised her to call back if she needed these faxed somewhere else.

## 2019-05-11 NOTE — Telephone Encounter (Signed)
Dan Perez, with Hospice called and left a Vm that the care giver gave them a piece of paper from the office visit that he just had that he need to start on Levothyroxine but does not have an actual script and also said that he needed to have labs done but they did not have an order. Please advise. [phone] ( the RX was sent to [facility] )

## 2019-05-27 ENCOUNTER — Telehealth (INDEPENDENT_AMBULATORY_CARE_PROVIDER_SITE_OTHER): Admitting: Cardiovascular Disease

## 2019-05-27 ENCOUNTER — Encounter: Payer: Self-pay | Admitting: Cardiovascular Disease

## 2019-05-27 VITALS — BP 152/76 | HR 53 | Temp 96.6°F | Resp 17 | Wt 139.0 lb

## 2019-05-27 DIAGNOSIS — I714 Abdominal aortic aneurysm, without rupture, unspecified: Secondary | ICD-10-CM

## 2019-05-27 DIAGNOSIS — I10 Essential (primary) hypertension: Secondary | ICD-10-CM

## 2019-05-27 DIAGNOSIS — R627 Adult failure to thrive: Secondary | ICD-10-CM

## 2019-05-27 DIAGNOSIS — I350 Nonrheumatic aortic (valve) stenosis: Secondary | ICD-10-CM

## 2019-05-27 DIAGNOSIS — Z952 Presence of prosthetic heart valve: Secondary | ICD-10-CM | POA: Diagnosis not present

## 2019-05-27 NOTE — Progress Notes (Signed)
Virtual Visit via Telephone Note   This visit type was conducted due to national recommendations for restrictions regarding the COVID-19 Pandemic (e.g. social distancing) in an effort to limit this patient's exposure and mitigate transmission in our community.  Due to his co-morbid illnesses, this patient is at least at moderate risk for complications without adequate follow up.  This format is felt to be most appropriate for this patient at this time.  The patient did not have access to video technology/had technical difficulties with video requiring transitioning to audio format only (telephone).  All issues noted in this document were discussed and addressed.  No physical exam could be performed with this format.  Please refer to the patient's chart for his  consent to telehealth for [facility].   Date:  05/27/2019   ID:  Dan Perez, DOB April 12, 1932, MRN [id number]  Patient Location: Home Provider Location: Office  PCP:  Perez, Dan Radon, MD  Cardiologist:  Dan Docker, MD  Electrophysiologist:  None   Evaluation Performed:  Follow-Up Visit  Chief Complaint:  TAVR  History of Present Illness:    Dan Perez is a 85 y.o. male with a history of pectus excavatum, scoliosis, HTN, AAA, CKD stage III, anemia,delusion disorder,dementia, thyroidectomy w/ iatrogenic hypothyroidism,moderate MR and severe aortic stenosis s/p TAVR (03/17/19).  Of note, pre-TAVR cath showed mild non obstructive CAD.  He underwent successful TAVR with a 29 mm Edwards Sapien THV on 03/17/2019. Post op echo showed EF 60% with normally functioning TAVR with mean gradient of 9 mm Hg and no significant PVL.  He has had progressive dementia and is now under hospice care.  He has been bedridden and has lost a lot of strength. He does have a good appetite.  He has chronic shortness of breath and is now on O2 24/7 (3L).  The patient does not have symptoms concerning for COVID-19 infection  (fever, chills, cough).   I spoke with Dan Perez, who is his caregiver and ex-wife. They got divorced when Dan Perez was 85 yrs old.  I also spoke with Dan Perez. She is wearing an event monitor and seeing an EP at [facility]. She is also receiving IV normal saline infusions.   Social history:His daughter, Dan Perez, is a Lawyer. She sees Dr.Branchfor dysautonomia. He has another daughter, Dan Perez. Dan Perez, his ex-wife, is his caregiver. He used to work in a Medical laboratory scientific officer for 30 years.   Past Medical History:  Diagnosis Date  . AAA (abdominal aortic aneurysm) without rupture ([facility])    a. [place] (11/03/2013): 3.9 x 3.3 cm; b. 01/2018 CT chest: mild dil of Abd Ao- 3.9cm.  . Chronic abdominal pain   . CKD (chronic kidney disease), stage III ([facility])   . Coronary artery calcification    a. 01/2018 Chest CT: Cor Ca2+ noted.  . Delusional disorder ([facility])    chronic  . Essential hypertension, benign   . History of goiter   . Multiple pulmonary nodules 03/11/2019   Recommend for follow-up non-contrast CT Chest in 3-6 months (06/11/2019 - 09/10/2019) for close observation.   . Paranoia ([facility])   . Right inguinal hernia    to see Dr. Malvin Perez for repair  . 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 Severe aortic stenosis    a. 06/2018 Echo: EF 60-65%, Gr1 DD. Mod to sev AS. At least mod MR; b. 01/2019 Echo: EF  55-60%. No rwma. Nl RV fxn. Sev AoV Ca2+ w/ Sev AS. Mean grad only 24mmHg but AoV area 0.75cm^2 (Vmax).   Past Surgical History:  Procedure Laterality Date  . CATARACT EXTRACTION, BILATERAL    . EYE SURGERY Left 2004   Cataract   . EYE SURGERY Right 2004   Cataract   . INGUINAL HERNIA REPAIR Right 12/17/2013   Procedure: HERNIA REPAIR INGUINAL ADULT;  Surgeon: Marlane HatcherWilliam S Bradford, MD;  Location: [facility];  Service: General;  Laterality: Right;  . INTRAOPERATIVE TRANSTHORACIC  ECHOCARDIOGRAM N/A 03/17/2019   Procedure: Intraoperative Transthoracic Echocardiogram;  Surgeon: Kathleene HazelMcAlhany, Christopher D, MD;  [facility];  Service: Open Heart Surgery;  Laterality: N/A;  . RIGHT HEART CATH AND CORONARY ANGIOGRAPHY N/A 03/10/2019   Procedure: RIGHT HEART CATH AND CORONARY ANGIOGRAPHY;  Surgeon: Kathleene HazelMcAlhany, Christopher D, MD;  [facility];  Service: Cardiovascular;  Laterality: N/A;  . THYROIDECTOMY  03/02/2011   Left lobectomy and isthmusectomy -   . TRANSCATHETER AORTIC VALVE REPLACEMENT, TRANSFEMORAL N/A 03/17/2019   Procedure: TRANSCATHETER AORTIC VALVE REPLACEMENT, TRANSFEMORAL;  Surgeon: Kathleene HazelMcAlhany, Christopher D, MD;  Location[facility];  Service: Open Heart Surgery;  Laterality: N/A;     Current Meds  Medication Sig  . acetaminophen (TYLENOL) 500 MG tablet Take 1,000 mg by mouth every 6 (six) hours as needed for headache (pain).  Marland Kitchen. amLODipine (NORVASC) 5 MG tablet Take 1 tablet (5 mg total) by mouth daily.  Marland Kitchen. aspirin 81 MG chewable tablet Chew 1 tablet (81 mg total) by mouth daily.  Marland Kitchen. atorvastatin (LIPITOR) 20 MG tablet Take 1 tablet (20 mg total) by mouth daily at 6 PM.  . clopidogrel (PLAVIX) 75 MG tablet Take 1 tablet (75 mg total) by mouth daily with breakfast.  . furosemide (LASIX) 20 MG tablet Take 1 tablet (20 mg total) by mouth as needed.  Marland Kitchen. levothyroxine (SYNTHROID) 50 MCG tablet Take 1 tablet (50 mcg total) by mouth daily before breakfast.  . lisinopril (ZESTRIL) 10 MG tablet Take 1 tablet (10 mg total) by mouth daily.  Marland Kitchen. loratadine (CLARITIN) 10 MG tablet Take 1 tablet (10 mg total) by mouth daily.  . Memantine HCl-Donepezil HCl (NAMZARIC) 28-10 MG CP24 Take 1 capsule by mouth daily.  . Multiple Vitamin (MULTIVITAMIN WITH MINERALS) TABS tablet Take 1 tablet by mouth daily.  . sertraline (ZOLOFT) 50 MG tablet Take 1 tablet (50 mg total) by mouth daily.     Allergies:   Patient has no known allergies.   Social History   Tobacco Use  .  Smoking status: Former Smoker    Quit date: 08/12/1983    Years since quitting: 35.8  . Smokeless tobacco: Never Used  Substance Use Topics  . Alcohol use: No  . Drug use: No     Family Hx: The patient's family history includes Alzheimer's disease in his mother and sister; Cancer in his brother; Dementia in his mother; Diabetes in his brother, daughter, and mother; Heart attack in his brother and father; Hyperlipidemia in his daughter, daughter, and son; Hypertension in his daughter and son; Stroke in his mother; Thyroid cancer in his father and mother; Varicose Veins in his daughter and mother.  ROS:   Please see the history of present illness.     All other systems reviewed and are negative.   Prior CV studies:   The following studies were reviewed today:  [facility] 03/10/19 Conclusion   Ost RCA to Prox RCA lesion is 30% stenosed.  Prox Cx  to Mid Cx lesion is 20% stenosed.  Mid LAD lesion is 20% stenosed.  1. Mild non-obstructive CAD 2. Unable to cross into the LV with a diagnostic catheter. The AL-1 was too short given his height and arm length). The long pigtail would not cross the valve given the severe angulation of the horizontal aortic root.   Recommendations: Will continue inpatient workup for TAVR. Will plans CT scans tomorrow if renal function is stable. Dr. Roxy Manns will also see over the next day.     _____________   TAVR OPERATIVE NOTE   Date of Procedure:03/17/2019  Preoperative Diagnosis:Severe Aortic Stenosis   Postoperative Diagnosis:Same   Procedure:   Transcatheter Aortic Valve Replacement - PercutaneousLeftTransfemoral Approach Edwards Sapien 3 THV (size 35mm, model # 9600TFX, serial # [id number])  Co-Surgeons:Christopher Angelena Form, MD andClarence H. Roxy Manns, MD  Anesthesiologist:Kevin Conrad Salvisa, MD  Echocardiographer:Mihai  Croitoru, MD  Pre-operative Echo Findings: ? Severe aortic stenosis ? Normalleft ventricular systolic function  Post-operative Echo Findings: ? Noparavalvular leak ? Normalleft ventricular systolic function  _____________  Echo 03/18/19 IMPRESSIONS 1. The left ventricle has normal systolic function with an ejection fraction of 60-65%. The cavity size was normal. There is mildly increased left ventricular wall thickness. Left ventricular diastolic Doppler parameters are consistent with impaired  relaxation. No evidence of left ventricular regional wall motion abnormalities. 2. The right ventricle has normal systolic function. The cavity was normal. There is no increase in right ventricular wall thickness. 3. Mild calcification of the mitral valve leaflet. There is mild mitral annular calcification present. No evidence of mitral valve stenosis. Mild mitral regurgitation. 4. Bioprosthetic aortic valve s/p TAVR. Mean gradient 9 mmHg with AVA 1.33 cm^2. No significant peri-valvular regurgitation noted. 5. The aortic root is normal in size and structure. 6. Normal IVC size. No complete TR doppler jet so unable to estimate PA systolic pressure.  _____________   Echo 04/07/19 IMPRESSIONS 1. The left ventricle has normal systolic function, with an ejection fraction of 55-60%. The cavity size was normal. There is mildly increased left ventricular wall thickness. Left ventricular diastolic Doppler parameters are consistent with impaired  relaxation. 2. Small pericardial effusion. 3. The pericardial effusion is anterior to the right ventricle. 4. The mitral valve was not well visualized. Mild thickening of the mitral valve leaflet. Mild calcification of the mitral valve leaflet. No evidence of mitral valve stenosis. 5. The aortic valve was not well visualized. No stenosis of the aortic valve. 6. There is an Edwards Sapien 3 THV (size 29 mm, model # [id number]) in the AV  position. The PW and CW Dopplers are not well aligned with flow leading to underestimation fo velocities and gradients. Overall there is no clear evidence of elevated gradients across the valve, no perivalvular regurgitation. 7. The aortic root is normal in size and structure. 8. Pulmonary hypertension is indeterminate, inadequate TR jet.    Labs/Other Tests and Data Reviewed:    EKG:  No ECG reviewed.  Recent Labs: 03/07/2019: TSH 1.240 03/17/2019: ALT 17 03/18/2019: Magnesium 2.2 03/21/2019: Hemoglobin 10.2; Platelets 166 04/16/2019: BUN 25; Creatinine, Ser 1.32; Potassium 4.4; Sodium 137   Recent Lipid Panel No results found for: CHOL, TRIG, HDL, CHOLHDL, LDLCALC, LDLDIRECT  Wt Readings from Last 3 Encounters:  05/27/19 139 lb (63 kg)  04/22/19 138 lb (62.6 kg)  04/16/19 139 lb 3.2 oz (63.1 kg)     Objective:    Vital Signs:  BP (!) 152/76   Pulse (!) 53   Temp (!) 96.6 F (  35.9 C)   Resp 17   Wt 139 lb (63 kg)   BMI 18.85 kg/m    VITAL SIGNS:  reviewed  ASSESSMENT & PLAN:    1.  Severe aortic stenosis status post TAVR: Most recent echocardiogram reviewed above with normally functioning TAVR.  He has NYHA class II symptoms.  Plavix will be discontinued after 6 months of therapy.  2.  Hypertension: Blood pressure is mildly elevated. No changes to therapy.  3.  Abdominal aortic aneurysm: CTA earlier this year showed stable infrarenal abdominal aortic aneurysm measuring 3.7 x 4 cm.  It was recommended he have a follow-up ultrasound in 2 years.  4. Failure to thrive: He has had rapidly progressive dementia. He is now under hospice care.    COVID-19 Education: The signs and symptoms of COVID-19 were discussed with the patient and how to seek care for testing (follow up with PCP or arrange E-visit).  The importance of social distancing was discussed today.  Time:   Today, I have spent 25 minutes with the patient with telehealth technology discussing the above problems.      Medication Adjustments/Labs and Tests Ordered: Current medicines are reviewed at length with the patient today.  Concerns regarding medicines are outlined above.   Tests Ordered: No orders of the defined types were placed in this encounter.   Medication Changes: No orders of the defined types were placed in this encounter.   Follow Up:  Virtual Visit or In Person in 6 month(s)  Signed, Dan DockerSuresh Koneswaran, MD  05/27/2019 3:30 PM    [facility]

## 2019-05-27 NOTE — Patient Instructions (Signed)

## 2019-07-15 ENCOUNTER — Telehealth: Payer: Self-pay | Admitting: Family Medicine

## 2019-07-15 NOTE — Telephone Encounter (Signed)
Hospice nurse aware and will get with him

## 2019-07-15 NOTE — Telephone Encounter (Signed)
Hospice last week BP 88/32 and 90/38. Was told to come of lisinopril and Amolodipine. They checked it back Monday it was 150/80 today 154/88. What do we need to do with BP meds too much on both. He has not been taking them since 10/16. Please advise.

## 2019-07-15 NOTE — Telephone Encounter (Signed)
Have him start back on just the amlodipine and take just half of it, 2.5 mg daily and then check the blood pressures and let me know how it is running from there.

## 2019-11-12 ENCOUNTER — Telehealth: Payer: Self-pay | Admitting: Family Medicine

## 2019-11-12 ENCOUNTER — Telehealth: Payer: Self-pay | Admitting: *Deleted

## 2019-11-12 NOTE — Telephone Encounter (Signed)
I had a missed call from [place] with Hospice regarding patient. I was unable to reach her at the number listed here, which was the same number she left on my voicemail. The person that answered stated that was no longer her #. I did call the daughter, [name], who informed me Okey Regal got orders for eye drops, antibiotics, and a nebulizer from the hospice physician, therefore they no longer needed orders from [place]. [name] denies that her father is aspirating as she is using thickener in his liquids and his food is puree. She states as long as she does this, he does fine. She states Okey Regal informed her the daytime sitter told her he was coughing but [name] has not heard him coughing from 5:30 yesterday evening to now. She states when she asked the sitter, the sitter said she didn't say that.

## 2019-11-12 NOTE — Telephone Encounter (Signed)
Added to other telephone encounter

## 2019-11-12 NOTE — Telephone Encounter (Signed)
Okay sounds good, thanks for checking in on this for me [place]

## 2019-11-12 NOTE — Telephone Encounter (Signed)
Call from [facility] w/ Hospice Pt has green drainage from both eyes, low grade temp, bad cough, aspirating, congestion left lung, pretty much bed bound now. Would like to know if he can get an abx sent to [facility]

## 2019-11-12 NOTE — Telephone Encounter (Signed)
Family would like Rx to go to [facility]

## 2019-11-25 ENCOUNTER — Telehealth: Payer: Self-pay | Admitting: *Deleted

## 2019-11-25 MED ORDER — CEPHALEXIN 250 MG/5ML PO SUSR: 500.0000 mg | Freq: Four times a day (QID) | ORAL | 0 refills | Status: AC

## 2019-11-25 NOTE — Addendum Note (Signed)
Addended by: Arville Care on: 11/25/2019 12:24 PM   Modules accepted: Orders

## 2019-11-25 NOTE — Telephone Encounter (Signed)
Hospice nurse aware abx called in

## 2019-11-25 NOTE — Telephone Encounter (Signed)
Tc from [facility] w/ Hospice Pt has a foley, has blood, dark urine, foul odor, discharge, sediment, discharge from penis. If abx called in for UTI, liquid preferred, pt is having trouble swallowing, not taking in food or his regular pills Please call into [place]

## 2019-11-25 NOTE — Telephone Encounter (Signed)
I sent in an antibiotic for him Keflex, liquid to [facility] Arville Care, MD [facility] 11/25/2019, 12:23 PM

## 2019-12-04 ENCOUNTER — Telehealth: Payer: Self-pay | Admitting: *Deleted

## 2019-12-04 NOTE — Telephone Encounter (Signed)
Catalina Gravel at [facility], [phone], notified to monitor patient and report back with changes.

## 2019-12-04 NOTE — Telephone Encounter (Signed)
If he is imminent, then just watch for now and keep me in the loop

## 2019-12-04 NOTE — Telephone Encounter (Addendum)
Okey Regal from [facility] call and states that patient completed abx and still has blood in catheter. Foley catheter was changed the day he finished his abx. Patient has been made immenient by hospice. Do you want them to watch patient, would you like culture ran on patient, would you like her to pull catheter, or  would you like to send in another abx? Please call Okey Regal with Hospice

## 2019-12-09 ENCOUNTER — Telehealth: Payer: Self-pay | Admitting: Family Medicine

## 2019-12-09 ENCOUNTER — Other Ambulatory Visit: Payer: PPO

## 2019-12-09 DIAGNOSIS — R319 Hematuria, unspecified: Secondary | ICD-10-CM

## 2019-12-09 LAB — URINALYSIS, COMPLETE
Bilirubin, UA: NEGATIVE
Glucose, UA: NEGATIVE
Nitrite, UA: NEGATIVE
Specific Gravity, UA: 1.015 (ref 1.005–1.030)
Urobilinogen, Ur: 1 mg/dL (ref 0.2–1.0)
pH, UA: >9 — ABNORMAL HIGH (ref 5.0–7.5)

## 2019-12-09 LAB — MICROSCOPIC EXAMINATION
Epithelial Cells (non renal): NONE SEEN /hpf (ref 0–10)
Renal Epithel, UA: NONE SEEN /hpf

## 2019-12-09 MED ORDER — CEPHALEXIN 500 MG PO CAPS: 500.0000 mg | ORAL_CAPSULE | Freq: Four times a day (QID) | ORAL | 0 refills | Status: AC

## 2019-12-09 NOTE — Telephone Encounter (Signed)
Urinalysis does show like possible infection, I sent in Keflex to [facility] for him. Arville Care, MD [facility] 12/09/2019, 4:55 PM

## 2019-12-09 NOTE — Addendum Note (Signed)
Addended by: Arville Care on: 12/09/2019 04:55 PM   Modules accepted: Orders

## 2019-12-09 NOTE — Telephone Encounter (Signed)
Pt having blood in his diaper, does not have foley cath in, was taken out last week. VO given to bring UA in for UA, C&S to office Orders placed

## 2019-12-10 ENCOUNTER — Other Ambulatory Visit: Payer: Self-pay | Admitting: Family Medicine

## 2019-12-10 LAB — URINE CULTURE

## 2019-12-10 NOTE — Telephone Encounter (Signed)
lmtcb

## 2019-12-11 ENCOUNTER — Telehealth: Payer: Self-pay | Admitting: *Deleted

## 2019-12-11 NOTE — Telephone Encounter (Signed)
Urine culture grew nothing, sent in antibiotic therapy urinalysis, please go ahead and send copies to them

## 2019-12-11 NOTE — Telephone Encounter (Signed)
TC Tabitha w/ Hospice  On UA C/S results Also please fax results to Hospice [phone]

## 2019-12-11 NOTE — Telephone Encounter (Signed)
Dan Perez aware and labs faxed.

## 2019-12-17 DIAGNOSIS — I10 Essential (primary) hypertension: Secondary | ICD-10-CM | POA: Diagnosis not present

## 2019-12-22 DIAGNOSIS — R0902 Hypoxemia: Secondary | ICD-10-CM | POA: Diagnosis not present

## 2019-12-22 DIAGNOSIS — I959 Hypotension, unspecified: Secondary | ICD-10-CM | POA: Diagnosis not present

## 2019-12-22 DIAGNOSIS — R404 Transient alteration of awareness: Secondary | ICD-10-CM | POA: Diagnosis not present

## 2019-12-22 DIAGNOSIS — Z7401 Bed confinement status: Secondary | ICD-10-CM | POA: Diagnosis not present

## 2020-01-16 DEATH — deceased

## 2020-03-16 ENCOUNTER — Other Ambulatory Visit (HOSPITAL_COMMUNITY): Payer: PPO

## 2020-03-16 ENCOUNTER — Ambulatory Visit: Payer: PPO | Admitting: Physician Assistant

## 2020-08-28 IMAGING — CT CT HEAR MORPH WITH CTA COR WITH SCORE WITH CA WITH CONTRAST AND
2 of 10 series · 8 of 20 positions shown, 9 images · IV contrast (APPLIED)
Comparison: Chest CT 01/30/2018.
COMPARISON: Chest CT 01/30/2018.

Addendum:
EXAM:
OVER-READ INTERPRETATION  CT CHEST

The following report is an over-read performed by radiologist Dr.
Badra Ali Botella [REDACTED] on 03/11/2019. This
over-read does not include interpretation of cardiac or coronary
anatomy or pathology. The coronary calcium score/coronary CTA
interpretation by the cardiologist is attached.
CLINICAL DATA: 86-year-old male with severe aortic stenosis being
evaluated for a TAVR procedure.
Cardiac TAVR CT
TECHNIQUE: The patient was scanned on a Phillips Force scanner. A 120 kV
retrospective scan was triggered in the descending thoracic aorta at
111 HU's. Gantry rotation speed was 250 msecs and collimation was .6
mm. No beta blockade or nitro were given. The 3D data set was
reconstructed in 5% intervals of the R-R cycle. Systolic and
diastolic phases were analyzed on a dedicated work station using
MPR, MIP and VRT modes. The patient received 80 cc of contrast.

[Series 10: 0-90% · axial · 0.39mm/px · z∈[+1040,+1156]mm · 4 of 3220 slices shown, 5 images]
[im 644/3220  vessel]
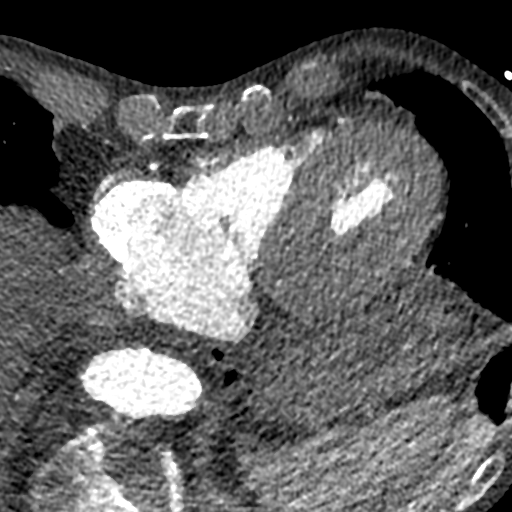
[im 644/3220  lung]
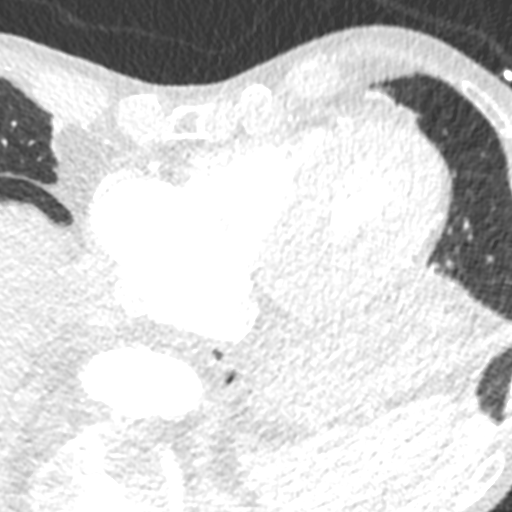
[im 1288/3220  vessel]
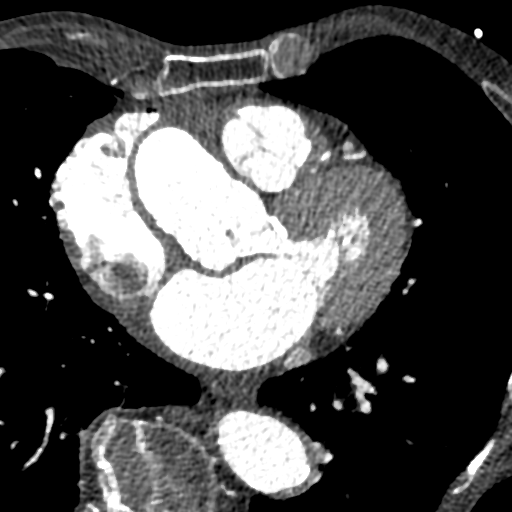
[im 1932/3220  vessel]
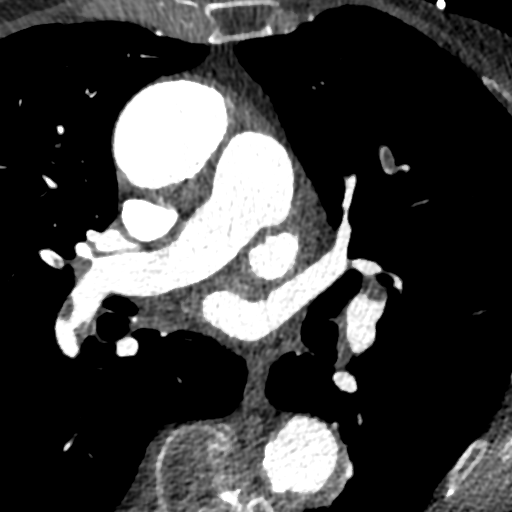
[im 2576/3220  vessel]
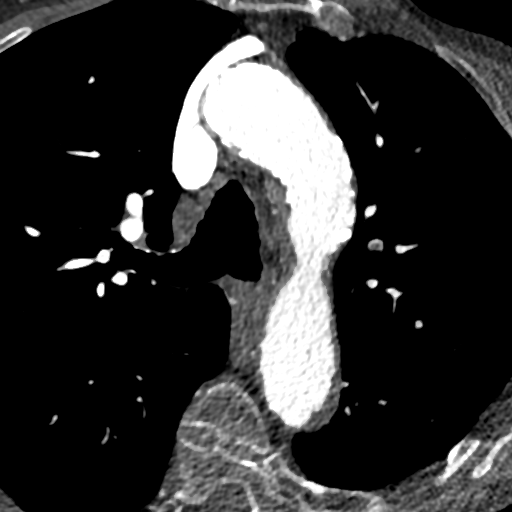

[Series 11: 5-95% · axial · 0.39mm/px · z∈[+1040,+1156]mm · 4 of 3220 slices shown]
[im 644/3220  vessel]
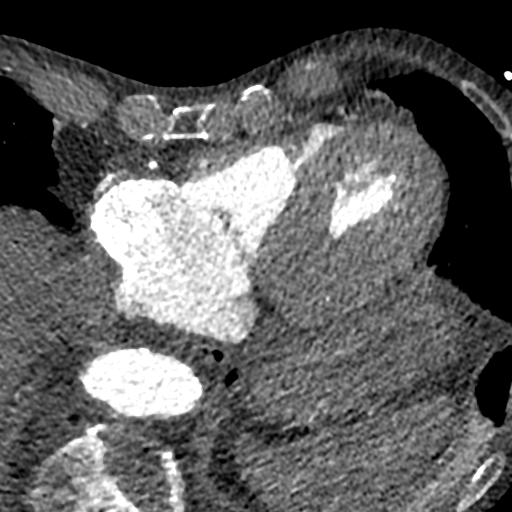
[im 1288/3220  vessel]
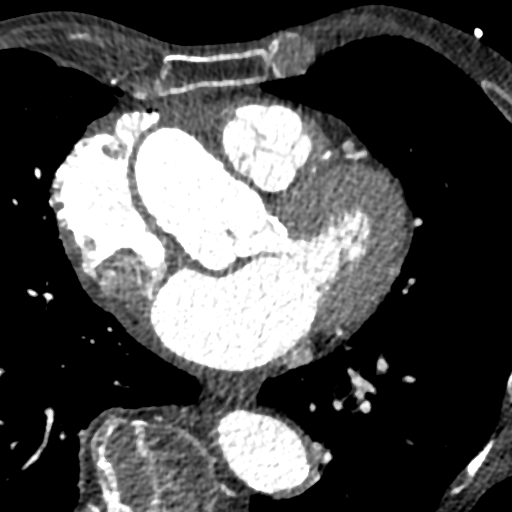
[im 1932/3220  vessel]
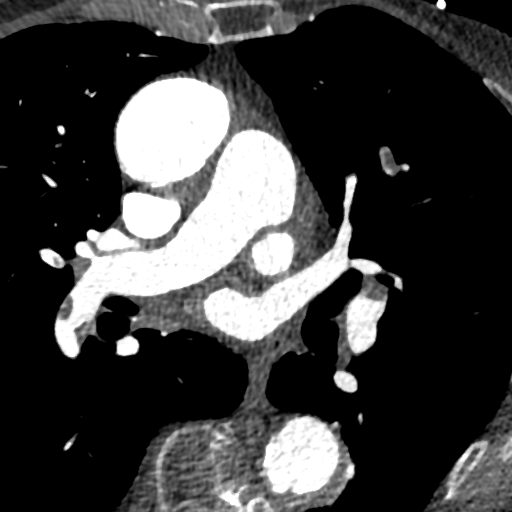
[im 2576/3220  vessel]
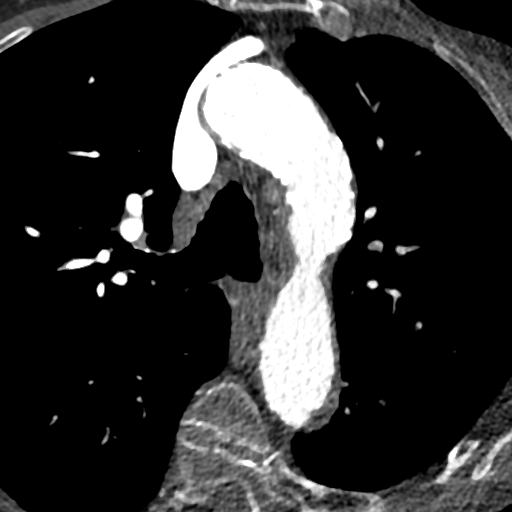

[8 of 20 positions shown; findings below may reference images not displayed]

FINDINGS: Extracardiac findings will be described separately under dictation
for contemporaneously obtained CTA chest, abdomen and pelvis.
IMPRESSION: Please see separate dictation for contemporaneously obtained CTA
chest, abdomen and pelvis dated 03/11/2019 for full description of
relevant extracardiac findings.
FINDINGS: Aortic Valve: Trileaflet aortic valve with severely thickened and
calcified leaflets and severely restricted leaflets opening and mild
asymmetric calcifications extending into the LVOT under the left
coronary sinus.

Aorta: Normal size with moderate diffuse atherosclerotic plaque and
calcifications. No dissection. Tortuous course of the abdominal
aorta.

Sinotubular Junction: 33 x 33 mm

Ascending Thoracic Aorta: 39 x 38 mm

Aortic Arch: 33 x 29 mm

Descending Thoracic Aorta: 29 x 27 mm

Sinus of Valsalva Measurements:

Non-coronary: 37 mm

Right -coronary: 36 mm

Left -coronary: 36 mm

Coronary Artery Height above Annulus:

Left Main: 19 mm

Right Coronary: 17 mm

Virtual Basal Annulus Measurements:

Maximum/Minimum Diameter: 30.6 x 25.2 mm

Mean Diameter: 26.7 mm

Perimeter: 86 mm

Area: 558 mm2

Optimum Fluoroscopic Angle for Delivery: RAO 4 BILLIE 43
IMPRESSION: 1. Trileaflet aortic valve with severely thickened and calcified
leaflets and severely restricted leaflets opening and mild
asymmetric calcifications extending into the LVOT under the left
coronary sinus. Annular measurements suitable for delivery of a 29
mm Edwards-SAPIEN 3 valve.

Aortic valve calcium score 8665 consistent with severe aortic
stenosis.

2. Sufficient coronary to annulus distance.

3. Optimum Fluoroscopic Angle for Delivery:  RAO 4 BILLIE 43.

4. No thrombus in the left atrial appendage.

*** End of Addendum ***
EXAM:
OVER-READ INTERPRETATION  CT CHEST

The following report is an over-read performed by radiologist Dr.
Badra Ali Botella [REDACTED] on 03/11/2019. This
over-read does not include interpretation of cardiac or coronary
anatomy or pathology. The coronary calcium score/coronary CTA
interpretation by the cardiologist is attached.
FINDINGS: Extracardiac findings will be described separately under dictation
for contemporaneously obtained CTA chest, abdomen and pelvis.
IMPRESSION: Please see separate dictation for contemporaneously obtained CTA
chest, abdomen and pelvis dated 03/11/2019 for full description of
relevant extracardiac findings.
